# Patient Record
Sex: Male | Born: 1935 | Race: White | Hispanic: No | Marital: Married | Smoking: Former smoker
Health system: Southern US, Community
[De-identification: ages and names within clinical notes are randomized; demographics above are authoritative.]

## PROBLEM LIST (undated history)

## (undated) DIAGNOSIS — E1159 Type 2 diabetes mellitus with other circulatory complications: Secondary | ICD-10-CM

## (undated) DIAGNOSIS — R1313 Dysphagia, pharyngeal phase: Secondary | ICD-10-CM

## (undated) DIAGNOSIS — I63112 Cerebral infarction due to embolism of left vertebral artery: Secondary | ICD-10-CM

## (undated) DIAGNOSIS — I4891 Unspecified atrial fibrillation: Secondary | ICD-10-CM

## (undated) DIAGNOSIS — I499 Cardiac arrhythmia, unspecified: Secondary | ICD-10-CM

## (undated) DIAGNOSIS — I10 Essential (primary) hypertension: Secondary | ICD-10-CM

## (undated) DIAGNOSIS — I152 Hypertension secondary to endocrine disorders: Secondary | ICD-10-CM

## (undated) DIAGNOSIS — E119 Type 2 diabetes mellitus without complications: Secondary | ICD-10-CM

## (undated) HISTORY — PX: TONSILLECTOMY: SHX5217

---

## 1993-07-31 HISTORY — PX: PROSTATE SURGERY: SHX751

## 2005-03-08 ENCOUNTER — Ambulatory Visit: Payer: Self-pay | Admitting: Internal Medicine

## 2005-03-14 ENCOUNTER — Ambulatory Visit: Payer: Self-pay | Admitting: Internal Medicine

## 2010-12-14 ENCOUNTER — Ambulatory Visit: Payer: Self-pay | Admitting: Ophthalmology

## 2012-07-20 ENCOUNTER — Inpatient Hospital Stay: Payer: Self-pay | Admitting: Student

## 2012-07-20 LAB — PROTIME-INR
INR: 3.3
Prothrombin Time: 33.2 secs — ABNORMAL HIGH (ref 11.5–14.7)

## 2012-07-20 LAB — COMPREHENSIVE METABOLIC PANEL
Albumin: 3.6 g/dL (ref 3.4–5.0)
Alkaline Phosphatase: 102 U/L (ref 50–136)
Anion Gap: 4 — ABNORMAL LOW (ref 7–16)
BUN: 33 mg/dL — ABNORMAL HIGH (ref 7–18)
Calcium, Total: 8.6 mg/dL (ref 8.5–10.1)
Glucose: 272 mg/dL — ABNORMAL HIGH (ref 65–99)
Potassium: 4.5 mmol/L (ref 3.5–5.1)
SGOT(AST): 29 U/L (ref 15–37)
Sodium: 141 mmol/L (ref 136–145)

## 2012-07-20 LAB — CBC
HGB: 14.5 g/dL (ref 13.0–18.0)
MCHC: 32.6 g/dL (ref 32.0–36.0)

## 2012-07-21 LAB — CBC WITH DIFFERENTIAL/PLATELET
Basophil #: 0.1 10*3/uL (ref 0.0–0.1)
Lymphocyte #: 2.7 10*3/uL (ref 1.0–3.6)
MCH: 30.6 pg (ref 26.0–34.0)
MCHC: 32.6 g/dL (ref 32.0–36.0)
Monocyte #: 1.3 x10 3/mm — ABNORMAL HIGH (ref 0.2–1.0)
Neutrophil #: 5.6 10*3/uL (ref 1.4–6.5)
Platelet: 137 10*3/uL — ABNORMAL LOW (ref 150–440)
RDW: 14.4 % (ref 11.5–14.5)
WBC: 10.1 10*3/uL (ref 3.8–10.6)

## 2012-07-21 LAB — BASIC METABOLIC PANEL
Calcium, Total: 8.9 mg/dL (ref 8.5–10.1)
Co2: 26 mmol/L (ref 21–32)
Osmolality: 296 (ref 275–301)
Potassium: 4 mmol/L (ref 3.5–5.1)
Sodium: 144 mmol/L (ref 136–145)

## 2012-07-21 LAB — LIPID PANEL: HDL Cholesterol: 45 mg/dL (ref 40–60)

## 2012-07-21 LAB — HEMOGLOBIN
HGB: 11.4 g/dL — ABNORMAL LOW (ref 13.0–18.0)
HGB: 11.9 g/dL — ABNORMAL LOW (ref 13.0–18.0)
HGB: 11.9 g/dL — ABNORMAL LOW (ref 13.0–18.0)

## 2012-07-21 LAB — PROTIME-INR: INR: 1.1

## 2012-07-21 LAB — HEMOGLOBIN A1C: Hemoglobin A1C: 7.7 % — ABNORMAL HIGH (ref 4.2–6.3)

## 2012-07-21 LAB — HEMATOCRIT: HCT: 36.8 % — ABNORMAL LOW (ref 40.0–52.0)

## 2012-07-22 LAB — HEMOGLOBIN: HGB: 11.8 g/dL — ABNORMAL LOW (ref 13.0–18.0)

## 2012-07-22 LAB — BASIC METABOLIC PANEL
Anion Gap: 8 (ref 7–16)
BUN: 25 mg/dL — ABNORMAL HIGH (ref 7–18)
Chloride: 109 mmol/L — ABNORMAL HIGH (ref 98–107)
EGFR (African American): 40 — ABNORMAL LOW
EGFR (Non-African Amer.): 35 — ABNORMAL LOW
Glucose: 169 mg/dL — ABNORMAL HIGH (ref 65–99)

## 2012-07-22 LAB — HEMATOCRIT: HCT: 34.8 % — ABNORMAL LOW (ref 40.0–52.0)

## 2012-07-22 LAB — PROTIME-INR
INR: 1
Prothrombin Time: 13.7 secs (ref 11.5–14.7)

## 2012-07-23 LAB — BASIC METABOLIC PANEL
Anion Gap: 9 (ref 7–16)
Calcium, Total: 8.5 mg/dL (ref 8.5–10.1)
Chloride: 109 mmol/L — ABNORMAL HIGH (ref 98–107)
Co2: 24 mmol/L (ref 21–32)
Osmolality: 291 (ref 275–301)
Potassium: 4 mmol/L (ref 3.5–5.1)

## 2012-07-23 LAB — HEMATOCRIT: HCT: 34.5 % — ABNORMAL LOW (ref 40.0–52.0)

## 2012-08-04 ENCOUNTER — Emergency Department: Payer: Self-pay | Admitting: Emergency Medicine

## 2012-08-04 LAB — COMPREHENSIVE METABOLIC PANEL
Albumin: 3.5 g/dL (ref 3.4–5.0)
Calcium, Total: 8.5 mg/dL (ref 8.5–10.1)
Co2: 26 mmol/L (ref 21–32)
Creatinine: 1.7 mg/dL — ABNORMAL HIGH (ref 0.60–1.30)
EGFR (Non-African Amer.): 36 — ABNORMAL LOW
Glucose: 117 mg/dL — ABNORMAL HIGH (ref 65–99)
SGPT (ALT): 83 U/L — ABNORMAL HIGH (ref 12–78)
Sodium: 144 mmol/L (ref 136–145)
Total Protein: 7 g/dL (ref 6.4–8.2)

## 2012-08-04 LAB — CK TOTAL AND CKMB (NOT AT ARMC): CK-MB: 3.3 ng/mL (ref 0.5–3.6)

## 2012-08-04 LAB — CBC
HGB: 11.3 g/dL — ABNORMAL LOW (ref 13.0–18.0)
MCHC: 32.5 g/dL (ref 32.0–36.0)
MCV: 94 fL (ref 80–100)

## 2012-09-30 ENCOUNTER — Emergency Department: Payer: Self-pay | Admitting: Emergency Medicine

## 2012-09-30 ENCOUNTER — Inpatient Hospital Stay (HOSPITAL_COMMUNITY): Payer: Medicare Other

## 2012-09-30 ENCOUNTER — Inpatient Hospital Stay (HOSPITAL_COMMUNITY)
Admission: AD | Admit: 2012-09-30 | Discharge: 2012-10-09 | DRG: 065 | Disposition: A | Discharge status: Rehabilitation Facility | Payer: Medicare Other | Source: Other Acute Inpatient Hospital | Attending: Neurology | Admitting: Neurology

## 2012-09-30 ENCOUNTER — Encounter (HOSPITAL_COMMUNITY): Payer: Self-pay | Admitting: General Practice

## 2012-09-30 DIAGNOSIS — I482 Chronic atrial fibrillation, unspecified: Secondary | ICD-10-CM

## 2012-09-30 DIAGNOSIS — R0602 Shortness of breath: Secondary | ICD-10-CM | POA: Diagnosis present

## 2012-09-30 DIAGNOSIS — B3781 Candidal esophagitis: Secondary | ICD-10-CM

## 2012-09-30 DIAGNOSIS — Z9989 Dependence on other enabling machines and devices: Secondary | ICD-10-CM | POA: Diagnosis present

## 2012-09-30 DIAGNOSIS — R4701 Aphasia: Secondary | ICD-10-CM | POA: Diagnosis present

## 2012-09-30 DIAGNOSIS — T18128A Food in esophagus causing other injury, initial encounter: Secondary | ICD-10-CM

## 2012-09-30 DIAGNOSIS — R1313 Dysphagia, pharyngeal phase: Secondary | ICD-10-CM

## 2012-09-30 DIAGNOSIS — I6789 Other cerebrovascular disease: Secondary | ICD-10-CM

## 2012-09-30 DIAGNOSIS — J4489 Other specified chronic obstructive pulmonary disease: Secondary | ICD-10-CM | POA: Diagnosis present

## 2012-09-30 DIAGNOSIS — R1310 Dysphagia, unspecified: Secondary | ICD-10-CM

## 2012-09-30 DIAGNOSIS — R1319 Other dysphagia: Secondary | ICD-10-CM | POA: Diagnosis present

## 2012-09-30 DIAGNOSIS — G4733 Obstructive sleep apnea (adult) (pediatric): Secondary | ICD-10-CM | POA: Diagnosis present

## 2012-09-30 DIAGNOSIS — E119 Type 2 diabetes mellitus without complications: Secondary | ICD-10-CM

## 2012-09-30 DIAGNOSIS — I6350 Cerebral infarction due to unspecified occlusion or stenosis of unspecified cerebral artery: Secondary | ICD-10-CM

## 2012-09-30 DIAGNOSIS — Z87891 Personal history of nicotine dependence: Secondary | ICD-10-CM

## 2012-09-30 DIAGNOSIS — R634 Abnormal weight loss: Secondary | ICD-10-CM | POA: Diagnosis present

## 2012-09-30 DIAGNOSIS — E039 Hypothyroidism, unspecified: Secondary | ICD-10-CM | POA: Diagnosis present

## 2012-09-30 DIAGNOSIS — I4891 Unspecified atrial fibrillation: Secondary | ICD-10-CM

## 2012-09-30 DIAGNOSIS — E785 Hyperlipidemia, unspecified: Secondary | ICD-10-CM | POA: Diagnosis present

## 2012-09-30 DIAGNOSIS — G8190 Hemiplegia, unspecified affecting unspecified side: Secondary | ICD-10-CM | POA: Diagnosis present

## 2012-09-30 DIAGNOSIS — I6340 Cerebral infarction due to embolism of unspecified cerebral artery: Principal | ICD-10-CM | POA: Diagnosis present

## 2012-09-30 DIAGNOSIS — Z9282 Status post administration of tPA (rtPA) in a different facility within the last 24 hours prior to admission to current facility: Secondary | ICD-10-CM

## 2012-09-30 DIAGNOSIS — I10 Essential (primary) hypertension: Secondary | ICD-10-CM | POA: Diagnosis present

## 2012-09-30 DIAGNOSIS — J441 Chronic obstructive pulmonary disease with (acute) exacerbation: Secondary | ICD-10-CM

## 2012-09-30 DIAGNOSIS — Z6827 Body mass index (BMI) 27.0-27.9, adult: Secondary | ICD-10-CM

## 2012-09-30 DIAGNOSIS — J449 Chronic obstructive pulmonary disease, unspecified: Secondary | ICD-10-CM | POA: Diagnosis present

## 2012-09-30 DIAGNOSIS — I63112 Cerebral infarction due to embolism of left vertebral artery: Secondary | ICD-10-CM

## 2012-09-30 DIAGNOSIS — R066 Hiccough: Secondary | ICD-10-CM | POA: Diagnosis not present

## 2012-09-30 DIAGNOSIS — R29810 Facial weakness: Secondary | ICD-10-CM | POA: Diagnosis present

## 2012-09-30 HISTORY — DX: Dysphagia, pharyngeal phase: R13.13

## 2012-09-30 HISTORY — DX: Hypertension secondary to endocrine disorders: I15.2

## 2012-09-30 HISTORY — DX: Unspecified atrial fibrillation: I48.91

## 2012-09-30 HISTORY — DX: Cerebral infarction due to embolism of left vertebral artery: I63.112

## 2012-09-30 HISTORY — DX: Type 2 diabetes mellitus with other circulatory complications: E11.59

## 2012-09-30 HISTORY — DX: Essential (primary) hypertension: I10

## 2012-09-30 HISTORY — DX: Type 2 diabetes mellitus without complications: E11.9

## 2012-09-30 LAB — COMPREHENSIVE METABOLIC PANEL
Albumin: 3.5 g/dL (ref 3.4–5.0)
Alkaline Phosphatase: 99 U/L (ref 50–136)
Calcium, Total: 8.5 mg/dL (ref 8.5–10.1)
Chloride: 108 mmol/L — ABNORMAL HIGH (ref 98–107)
Co2: 28 mmol/L (ref 21–32)
Creatinine: 1.65 mg/dL — ABNORMAL HIGH (ref 0.60–1.30)
Glucose: 149 mg/dL — ABNORMAL HIGH (ref 65–99)
Osmolality: 291 (ref 275–301)
Potassium: 3.4 mmol/L — ABNORMAL LOW (ref 3.5–5.1)
SGOT(AST): 36 U/L (ref 15–37)
Total Protein: 7.5 g/dL (ref 6.4–8.2)

## 2012-09-30 LAB — CBC
HCT: 38.6 % — ABNORMAL LOW (ref 40.0–52.0)
HGB: 12.1 g/dL — ABNORMAL LOW (ref 13.0–18.0)
MCV: 86 fL (ref 80–100)
RDW: 14.4 % (ref 11.5–14.5)
WBC: 9.2 10*3/uL (ref 3.8–10.6)

## 2012-09-30 LAB — TROPONIN I: Troponin-I: <0.02 ng/mL

## 2012-09-30 LAB — PROTIME-INR: Prothrombin Time: 13.7 secs (ref 11.5–14.7)

## 2012-09-30 LAB — CK TOTAL AND CKMB (NOT AT ARMC)
CK, Total: 116 U/L (ref 35–232)
CK-MB: 2.2 ng/mL (ref 0.5–3.6)

## 2012-09-30 MED ORDER — ONDANSETRON HCL 4 MG/2ML IJ SOLN
4.0000 mg | Freq: Four times a day (QID) | INTRAMUSCULAR | Status: DC | PRN
Start: 2012-09-30 — End: 2012-10-09
  Administered 2012-09-30 – 2012-10-09 (×8): 4 mg via INTRAVENOUS
  Filled 2012-09-30 (×8): qty 2

## 2012-09-30 MED ORDER — INSULIN ASPART 100 UNIT/ML ~~LOC~~ SOLN
0.0000 [IU] | Freq: Three times a day (TID) | SUBCUTANEOUS | Status: DC
  Administered 2012-10-01 – 2012-10-02 (×4): 3 [IU] via SUBCUTANEOUS
  Administered 2012-10-02 – 2012-10-03 (×3): 2 [IU] via SUBCUTANEOUS
  Administered 2012-10-03: 3 [IU] via SUBCUTANEOUS
  Administered 2012-10-03 – 2012-10-04 (×2): 2 [IU] via SUBCUTANEOUS
  Administered 2012-10-04: 3 [IU] via SUBCUTANEOUS
  Administered 2012-10-04 – 2012-10-08 (×4): 2 [IU] via SUBCUTANEOUS
  Administered 2012-10-08: 3 [IU] via SUBCUTANEOUS
  Administered 2012-10-08: 2 [IU] via SUBCUTANEOUS
  Administered 2012-10-09: 3 [IU] via SUBCUTANEOUS

## 2012-09-30 MED ORDER — SENNOSIDES-DOCUSATE SODIUM 8.6-50 MG PO TABS
1.0000 | ORAL_TABLET | Freq: Every evening | ORAL | Status: DC | PRN
  Filled 2012-09-30 (×2): qty 1

## 2012-09-30 MED ORDER — SODIUM CHLORIDE 0.9 % IV SOLN
INTRAVENOUS | Status: DC
  Administered 2012-09-30 – 2012-10-01 (×2): via INTRAVENOUS

## 2012-09-30 NOTE — Consult Note (Signed)
Referring Physician: Transferred from [facility] after receiving IV tpa.    Chief Complaint: right sided weakness.  HPI:                                                                                                                                         Joe Snow is an 79 y.o. male with a past medical history significant for hypertension, diabetes, atrial fibrillation off coumadin due to diverticulitis with GI bleeding December 2013,  who was in his usual state of health until 12:45 pm today when he started vomiting and was noted to have right arm weakness and right face droopiness.  He was taken to [facility] ED where he reported to have a NIHSS 5 and a CT brain that showed no acute intracranial abnormality. There was a concerned about patient's bleeding last December as well as very elevated systolic blood pressure which apparently resulted in a delay in administering thrombolytics. I was informed by the ED physician that patient's daughter was demanding treatment with IV thrombolytics even when he was close to the 4 hours after onset of symptoms. He was transferred to [facility] for further management. Upon arrival to the NICU his NIHSS was still 5, and he was complaining of a mild headache.  LSN: 12:45 pm. tPA Given: yes, IV tpa.   No past medical history on file.  No past surgical history on file.  No family history on file. Social History:  has no tobacco, alcohol, and drug history on file.  Allergies: Allergies not on file  Medications:                                                                                                                           I have reviewed the patient's current medications.  ROS:  History obtained from the patient and chart review.  General ROS: negative for - chills, fatigue,  fever, night sweats, weight gain or weight loss Psychological ROS: negative for - behavioral disorder, hallucinations, memory difficulties, mood swings or suicidal ideation Ophthalmic ROS: negative for - blurry vision, double vision, eye pain or loss of vision ENT ROS: negative for - epistaxis, nasal discharge, oral lesions, sore throat, tinnitus or vertigo Allergy and Immunology ROS: negative for - hives or itchy/watery eyes Hematological and Lymphatic ROS: negative for - bleeding problems, bruising or swollen lymph nodes Endocrine ROS: negative for - galactorrhea, hair pattern changes, polydipsia/polyuria or temperature intolerance Respiratory ROS: negative for - cough, hemoptysis, shortness of breath or wheezing Cardiovascular ROS: negative for - chest pain, dyspnea on exertion, edema or irregular heartbeat Gastrointestinal ROS: negative for - abdominal pain, diarrhea,or stool incontinence Genito-Urinary ROS: negative for - dysuria, hematuria, incontinence or urinary frequency/urgency Musculoskeletal ROS: negative for - joint swelling or muscular weakness Neurological ROS: as noted in HPI Dermatological ROS: negative for rash and skin lesion changes      Physical exam: pleasant male in no apparent distress. Blood pressure 135/64, pulse 69, temperature 97.5 F (36.4 C), temperature source Oral, resp. rate 16, SpO2 99.00%. Head: normocephalic. Neck: supple, no bruits, no JVD. Cardiac: no murmurs. Lungs: clear. Abdomen: soft, no tender, no mass. Extremities: no edema.  Neurologic Examination:                                                                                                      Mental Status: Alert, awake, oriented x 4, thought content appropriate.  Speech fluent without evidence of aphasia.  Able to follow 3 step commands without difficulty. Cranial Nerves: II: Discs flat bilaterally; Visual fields grossly normal, pupils equal, round, reactive to light and  accommodation III,IV, VI: ptosis not present, extra-ocular motions intact bilaterally V: facial light touch sensation normal bilaterally. VII: subtle right lower face droopiness. VIII: hearing normal bilaterally IX,X: gag reflex present XI: bilateral shoulder shrug XII: midline tongue extension Motor: Right : Upper extremity   5/5    Left:     Upper extremity   5/5  Lower extremity   5/5     Lower extremity   5/5 Tone and bulk:normal tone throughout; no atrophy noted Sensory: Pinprick and light touch intact throughout, bilaterally. Deep Tendon Reflexes: 1+ and symmetric throughout Plantars: Right: equivocal.   Left: upgoing Cerebellar: Can not [erform finger-to-nose and heel-to-shin test in the right due to weakness. Normal in the left side. Gait: no tested. CV: pulses palpable throughout    No results found for this or any previous visit (from the past 48 hour(s)). No results found.     Assessment: 78 y.o. male with risk factors for stroke that includes hypertension, diabetes, and atrial fibrillation, developed acute onset right sided weakness and right face droopiness at 12:45 pm today. Received IV tpa at [facility] and transferred to [facility]. NIHSS 5 at this moment. Admit to our service and follow post iv tpa protocol. Stroke service to resume care in the morning.  [name], MD  Plan: 1. HgbA1c, fasting lipid panel 2. MRI, MRA  of the brain without contrast 3. PT consult, OT consult, Speech consult 4. Echocardiogram 5. Carotid dopplers 6. Prophylactic therapy-None at this moment. 7. Risk factor modification 8. Telemetry monitoring 9. Frequent neuro checks  [facility] [phone]  09/30/2012, 6:10 PM

## 2012-10-01 ENCOUNTER — Encounter (HOSPITAL_COMMUNITY): Payer: Self-pay | Admitting: Nurse Practitioner

## 2012-10-01 ENCOUNTER — Inpatient Hospital Stay (HOSPITAL_COMMUNITY): Payer: Medicare Other

## 2012-10-01 DIAGNOSIS — I63219 Cerebral infarction due to unspecified occlusion or stenosis of unspecified vertebral arteries: Secondary | ICD-10-CM

## 2012-10-01 DIAGNOSIS — I059 Rheumatic mitral valve disease, unspecified: Secondary | ICD-10-CM

## 2012-10-01 DIAGNOSIS — I4891 Unspecified atrial fibrillation: Secondary | ICD-10-CM

## 2012-10-01 DIAGNOSIS — I6350 Cerebral infarction due to unspecified occlusion or stenosis of unspecified cerebral artery: Secondary | ICD-10-CM

## 2012-10-01 LAB — GLUCOSE, CAPILLARY
Glucose-Capillary: 157 mg/dL — ABNORMAL HIGH (ref 70–99)
Glucose-Capillary: 151 mg/dL — ABNORMAL HIGH (ref 70–99)

## 2012-10-01 LAB — HEMOGLOBIN A1C: Hgb A1c MFr Bld: 8 % — ABNORMAL HIGH (ref <5.7)

## 2012-10-01 LAB — LIPID PANEL
HDL: 51 mg/dL (ref >39)
LDL Cholesterol: 65 mg/dL (ref 0–99)
Triglycerides: 46 mg/dL (ref <150)

## 2012-10-01 MED ORDER — SIMVASTATIN 5 MG PO TABS
5.0000 mg | ORAL_TABLET | Freq: Every day | ORAL | Status: DC
  Administered 2012-10-01 – 2012-10-08 (×7): 5 mg via ORAL
  Filled 2012-10-01 (×9): qty 1

## 2012-10-01 MED ORDER — GLUCERNA SHAKE PO LIQD
237.0000 mL | Freq: Two times a day (BID) | ORAL | Status: DC
  Administered 2012-10-01 – 2012-10-03 (×4): 237 mL via ORAL

## 2012-10-01 MED ORDER — LISINOPRIL 5 MG PO TABS
5.0000 mg | ORAL_TABLET | Freq: Every day | ORAL | Status: DC
  Administered 2012-10-01 – 2012-10-09 (×8): 5 mg via ORAL
  Filled 2012-10-01 (×9): qty 1

## 2012-10-01 NOTE — Evaluation (Signed)
Speech Language Pathology Evaluation Patient Details Name: Joe Snow MRN: [id number] DOB: 23-Jan-1924 Today's Date: 10/01/2012 Time: 8295-6213 SLP Time Calculation (min): 26 min  Problem List: There is no problem list on file for this patient.  Past Medical History:  Past Medical History  Diagnosis Date  . Hypertension associated with diabetes   . Diabetes   . Atrial fibrillation    Past Surgical History:  Past Surgical History  Procedure Laterality Date  . Prostate surgery N/A 1995    estimate  . Tonsillectomy Bilateral 1930s   HPI:  [name] is a 79 y.o. male presenting with right arm and face weakness. Status post IV t-PA 09/30/2012 at 1630 at [facility], transferred to [facility]. tPA received 3h and 45 mins post symptom onset. Left brain stroke suspected. Imaging pending (MRI scheduled for this after noon at 1630, 24h post tPA). Off warfarin since Dec 2013 for GIB. Now on no anticoagulants as within 24h of tpa administration for secondary stroke prevention. Patient with resultant right arm hemiparesis, facial droop, dysarthria.    Assessment / Plan / Recommendation Clinical Impression  Pt demosntrates a mild expressive aphasia at conversation level when discussing unfamiliar/complex topics. Pt with slow verbal processing, circumlocutions and mild anomia. There was also some evidence for memory deficit. Pt would benefit from ongoing acute SLP therapy to facilitate verbal expression and higher level cognition as needed. Would agree with CIR consult.     SLP Assessment  Patient needs continued Speech Lanaguage Pathology Services    Follow Up Recommendations  Inpatient Rehab    Frequency and Duration min 1 x/week  2 weeks   Pertinent Vitals/Pain NA   SLP Goals  SLP Goals Potential to Achieve Goals: Good Progress/Goals/Alternative treatment plan discussed with pt/caregiver and they: Agree SLP Goal #1: Pt will verbalize at conversation level withmin verbal  cues for word finding for 10 minutes SLP Goal #2: Pt will utilize compensatory strategies for word finding with min verbal cues x3 over 20 minute conversation.   SLP Evaluation Prior Functioning  Cognitive/Linguistic Baseline: Within functional limits Type of Home: House Lives With: Spouse Available Help at Discharge: Family;Available 24 hours/day Vocation: Retired   IT consultant  Overall Cognitive Status: Appears within functional limits for tasks assessed Arousal/Alertness: Awake/alert Orientation Level: Oriented X4 Attention: Focused;Sustained;Selective;Alternating Focused Attention: Appears intact Sustained Attention: Appears intact Selective Attention: Appears intact Alternating Attention: Impaired Alternating Attention Impairment: Verbal complex;Functional complex Memory: Impaired Memory Impairment: Storage deficit;Decreased recall of new information Awareness: Impaired Awareness Impairment: Anticipatory impairment Executive Function: Reasoning Reasoning: Impaired Reasoning Impairment: Verbal complex    Comprehension  Auditory Comprehension Overall Auditory Comprehension: Appears within functional limits for tasks assessed    Expression Verbal Expression Overall Verbal Expression: Impaired Initiation: No impairment Automatic Speech: Name;Social Response;Counting;Month of year Level of Generative/Spontaneous Verbalization: Conversation Repetition: No impairment Naming: Impairment Responsive: 76-100% accurate Confrontation: Within functional limits Convergent: Not tested Divergent: 75-100% accurate Written Expression Dominant Hand: Right   Oral / Motor Oral Motor/Sensory Function Overall Oral Motor/Sensory Function: Appears within functional limits for tasks assessed Motor Speech Overall Motor Speech: Appears within functional limits for tasks assessed   GO    Harlon Ditty, MA CCC-SLP [phone]  Claudine Mouton 10/01/2012, 4:52 PM

## 2012-10-01 NOTE — Progress Notes (Signed)
VASCULAR LAB PRELIMINARY  PRELIMINARY  PRELIMINARY  PRELIMINARY  Carotid duplex  completed.    Preliminary report:  Bilateral:  No evidence of hemodynamically significant internal carotid artery stenosis.   Vertebral artery flow is antegrade.  Right vertebral artery waveform is spiked with loss of diastolic component.    CESTONE, HELENE, RVT 10/01/2012, 1:21 PM

## 2012-10-01 NOTE — Progress Notes (Addendum)
Stroke Team Progress Note  HISTORY Joe Snow is an 79 y.o. male with a past medical history significant for hypertension, diabetes, atrial fibrillation off coumadin due to diverticulitis with GI bleeding December 2013, who was in his usual state of health until 12:45 pm today 09/30/2012 when he started vomiting and was noted to have right arm weakness and right face droopiness.  He was taken to [facility] ED where he reported to have a NIHSS 5 and a CT brain that showed no acute intracranial abnormality.  There was a concerned about patient's bleeding last December as well as very elevated systolic blood pressure which apparently resulted in a delay in administering thrombolytics. I was informed by the ED physician that patient's daughter was demanding treatment with IV thrombolytics even when he was close to the 4 hours after onset of symptoms. He was transferred to [facility] for further management.  Upon arrival to the NICU his NIHSS was still 5, and he was complaining of a mild headache.  SUBJECTIVE His wife is at the bedside.  Overall he feels his condition is gradually improving. They do report SOB since GIB in Dec when medications were changed. He has episodes when it is worse than baseline. Etiology is unclear.  OBJECTIVE Most recent Vital Signs: Filed Vitals:   10/01/12 0400 10/01/12 0500 10/01/12 0600 10/01/12 0800  BP: 144/70 146/76 159/85 166/77  Pulse: 72 66 66 63  Temp:      TempSrc:      Resp: 13 17 11 18   Height:      Weight:      SpO2: 99% 99% 100% 98%   CBG (last 3)   Recent Labs  09/30/12 2349 10/01/12 0357  GLUCAP 198* 157*    IV Fluid Intake:   . sodium chloride 75 mL/hr at 10/01/12 0800    MEDICATIONS  . insulin aspart  0-15 Units Subcutaneous TID WC   PRN:  ondansetron, senna-docusate  Diet:  Carb Control thin liquids Activity:  Bedrest DVT Prophylaxis:  SCDs   CLINICALLY SIGNIFICANT STUDIES Basic Metabolic Panel: No results found  for this basename: NA, K, CL, CO2, GLUCOSE, BUN, CREATININE, CALCIUM, MG, PHOS,  in the last 168 hours Liver Function Tests: No results found for this basename: AST, ALT, ALKPHOS, BILITOT, PROT, ALBUMIN,  in the last 168 hours CBC: No results found for this basename: WBC, NEUTROABS, HGB, HCT, MCV, PLT,  in the last 168 hours Coagulation: No results found for this basename: LABPROT, INR,  in the last 168 hours Cardiac Enzymes: No results found for this basename: CKTOTAL, CKMB, CKMBINDEX, TROPONINI,  in the last 168 hours Urinalysis: No results found for this basename: COLORURINE, APPERANCEUR, LABSPEC, PHURINE, GLUCOSEU, HGBUR, BILIRUBINUR, KETONESUR, PROTEINUR, UROBILINOGEN, NITRITE, LEUKOCYTESUR,  in the last 168 hours Lipid Panel    Component Value Date/Time   CHOL 125 10/01/2012 0325   TRIG 46 10/01/2012 0325   HDL 51 10/01/2012 0325   CHOLHDL 2.5 10/01/2012 0325   VLDL 9 10/01/2012 0325   LDLCALC 65 10/01/2012 0325   HgbA1C  No results found for this basename: HGBA1C    Urine Drug Screen:   No results found for this basename: labopia, cocainscrnur, labbenz, amphetmu, thcu, labbarb    Alcohol Level: No results found for this basename: ETH,  in the last 168 hours  CT of the brain    MRI of the brain    MRA of the brain    2D Echocardiogram    Carotid Doppler  CXR  09/30/2012   Cardiomegaly with underlying chronic interstitial coarsening.   EKG     Therapy Recommendations   Physical Exam   Pleasant middle aged caucasian male not in distress.Awake alert. Afebrile. Head is nontraumatic. Neck is supple without bruit. Hearing is normal. Cardiac exam no murmur or gallop. Lungs are clear to auscultation. Distal pulses are well felt. Neurological Exam ; a wake and alert oriented x3 with normal speech and language. Extraocular moments are full range without nystagmus. Vision acuity and fields appear normal. Fundi were not visualized. There is no facial weakness. There is no dysarthria. Tongue  is midline. Motor system exam reveals no upper or lower extremity drift. Fine finger movements are diminished on the right. There is mild finger-to-nose dysmetria on the right. Right grip is weaker than the left. He orbits left over right approximately. No lower extremity weakness. Sensation appears normal. Gait was not tested.  NIHSS one ASSESSMENT Joe Snow is a 79 y.o. male presenting with right arm and face weakness. Status post IV t-PA 09/30/2012 at 1630 at [facility], transferred to [facility]. tPA received 3h and 45 mins post symptom onset. Left brain stroke suspected. Imaging pending (MRI scheduled for this after noon at 1630, 24h post tPA). Off warfarin since Dec 2013 for GIB. Now on no anticoagulants as within 24h of tpa administration for secondary stroke prevention. Patient with resultant right arm hemiparesis, facial droop, dysarthria. Work up underway.  Hypertension Diabetes, HgbA1c pending atrial fibrillation, taken off coumadin Dec 2013 due to GIB LDL 65 Hx diverticulitis/GIB 06/2012 taken off coumadin at that time SOB episodes, ? etiology  Hospital day # 1  TREATMENT/PLAN  MRI/MRA at 1630 today  If imaging negative for hemorrhage, Add anticoagulant (prefer newer anticoagulants) for secondary stroke prevention if cardiology agrees, otherwise, will consider aspirin and plavix.  OOB. Therapy evals  Complete 24h post tPA ICU monitoring. If stable after 24h, ok for transfer to the floor  Cardiology consult to assess cardiac reasons for SOB and atrial fibrillation  SHARON BIBY, MSN, RN, ANVP-BC, ANP-BC, GNP-BC [facility] Pager: [phone] 10/01/2012 8:16 AM  I have personally obtained a history, examined the patient, evaluated imaging results, and formulated the assessment and plan of care. I agree with the above. This patient is critically ill and at significant risk of neurological worsening, death and care requires constant monitoring of vital  signs, hemodynamics,respiratory and cardiac monitoring,review of multiple databases, neurological assessment, discussion with family, other specialists and medical decision making of high complexity. I spent 30 minutes of neurocritical care time  in the care of  this patient.  Delia Heady, MD Medical Director [facility] Pager: [phone] 10/01/2012 3:53 PM

## 2012-10-01 NOTE — Progress Notes (Signed)
Physical medicine and rehabilitation consult has been requested. Full neuro workup presently ongoing and patient not in the room. Will followup as evaluations completed

## 2012-10-01 NOTE — Progress Notes (Signed)
Rehab Admissions Coordinator Note:  Patient was screened by [name] Dare for appropriateness for an Inpatient Acute Rehab Consult.  At this time, we are recommending Inpatient Rehab consult.  [name] Dare 10/01/2012, 10:52 AM  I can be reached at [phone].

## 2012-10-01 NOTE — Evaluation (Signed)
Physical Therapy Evaluation Patient Details Name: Joe Snow MRN: [id number] DOB: October 09, 1923 Today's Date: 10/01/2012 Time: 0865-7846 PT Time Calculation (min): 52 min  PT Assessment / Plan / Recommendation Clinical Impression  79 y.o. male admitted to [facility] for right sided weakness, fall suspected stroke.  The pt did recieve IV tPA.  MRI/A pending.  He presents with more right sided coordination issues and some mild strength deficits on his right.  Decreased sitting and standing balance, decreased safety awareness and impulsivity.  He would benefit from inpatient rehabilitation at discharge for intensive therapy prior to returning home.      PT Assessment  Patient needs continued PT services    Follow Up Recommendations  CIR    Does the patient have the potential to tolerate intense rehabilitation    yes  Barriers to Discharge None none    Equipment Recommendations  Rolling walker with 5" wheels    Recommendations for Other Services Rehab consult   Frequency Min 4X/week    Precautions / Restrictions Precautions Precautions: Fall Precaution Comments: decreased safety awareness   Pertinent Vitals/Pain No reports of pain, see vials flow sheet for vitals      Mobility  Bed Mobility Bed Mobility: Supine to Sit;Sitting - Scoot to Edge of Bed Supine to Sit: 5: Supervision;With rails Sitting - Scoot to Edge of Bed: With rail;4: Min guard Details for Bed Mobility Assistance: supervision for safety due to pt tends to LOB to the right when moving.   Transfers Transfers: Sit to Stand;Stand to Sit;Stand Pivot Transfers Sit to Stand: 1: +2 Total assist;With upper extremity assist;From bed;From chair/3-in-1 Sit to Stand: Patient Percentage: 70% Stand to Sit: 1: +2 Total assist;With armrests;To bed;To chair/3-in-1;With upper extremity assist Stand to Sit: Patient Percentage: 70% Stand Pivot Transfers: 1: +2 Total assist Stand Pivot Transfers: Patient Percentage: 70% Details for  Transfer Assistance: Verbal cues for sequencing left weight shift and balance.   Modified Rankin (Stroke Patients Only) Pre-Morbid Rankin Score: No symptoms Modified Rankin: Severe disability        PT Diagnosis: Difficulty walking;Abnormality of gait;Generalized weakness;Altered mental status  PT Problem List: Decreased strength;Decreased activity tolerance;Decreased balance;Decreased mobility;Decreased coordination;Decreased cognition;Decreased knowledge of use of DME;Decreased safety awareness PT Treatment Interventions: DME instruction;Gait training;Stair training;Functional mobility training;Therapeutic activities;Therapeutic exercise;Balance training;Neuromuscular re-education;Cognitive remediation;Patient/family education   PT Goals Acute Rehab PT Goals PT Goal Formulation: With patient/family Time For Goal Achievement: 10/15/12 Potential to Achieve Goals: Good Pt will go Supine/Side to Sit: with modified independence;with HOB 0 degrees PT Goal: Supine/Side to Sit - Progress: Goal set today Pt will go Sit to Supine/Side: with modified independence;with HOB 0 degrees PT Goal: Sit to Supine/Side - Progress: Goal set today Pt will go Sit to Stand: with supervision PT Goal: Sit to Stand - Progress: Goal set today Pt will go Stand to Sit: with supervision;with upper extremity assist PT Goal: Stand to Sit - Progress: Goal set today Pt will Transfer Bed to Chair/Chair to Bed: with min assist PT Transfer Goal: Bed to Chair/Chair to Bed - Progress: Goal set today Pt will Ambulate: 16 - 50 feet;with min assist;with least restrictive assistive device PT Goal: Ambulate - Progress: Goal set today Pt will Go Up / Down Stairs: 3-5 stairs;with least restrictive assistive device;with min assist PT Goal: Up/Down Stairs - Progress: Goal set today  Visit Information  Last PT Received On: 10/01/12 Assistance Needed: +2 PT/OT Co-Evaluation/Treatment: Yes    Subjective Data  Subjective: Pt  reports, " You tell them what happended"  to his wife re: situation surrounding his admission. He was unable to report the details.   Patient Stated Goal: Wife is interested in reahb in [place] to be closer to home   Prior Functioning  Home Living Lives With: Spouse Available Help at Discharge: Family;Available 24 hours/day Type of Home: House Home Access: Stairs to enter [employer] of Steps: 4 Entrance Stairs-Rails: None Home Layout: Two level Alternate Level Stairs-Number of Steps: 20 (pt sleeps downstairs) Bathroom Shower/Tub: Tub/shower unit;Curtain;Walk-in shower Bathroom Toilet: Handicapped height Bathroom Accessibility: Yes How Accessible: Accessible via walker;Accessible via wheelchair Home Adaptive Equipment: Hand-held shower hose;Shower chair with back;Grab bars in shower Prior Function Level of Independence: Independent Able to Take Stairs?: Yes Driving: Yes 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[employer] Dominant Hand: Right    Cognition  Cognition Overall Cognitive Status: Impaired Area of Impairment: Memory;Safety/judgement Arousal/Alertness: Awake/alert Orientation Level: Oriented X4 / Intact Behavior During Session: Other (comment) (impulsive) Memory Deficits: relies heavily on wife for report of history (why he came in and home layout questions) Safety/Judgement: Decreased safety judgement for tasks assessed;Impulsive;Decreased awareness of need for assistance    Extremity/Trunk Assessment Right Lower Extremity Assessment RLE ROM/Strength/Tone: Deficits RLE ROM/Strength/Tone Deficits: 4/5 RLE Sensation: WFL - Light Touch RLE Coordination: Deficits RLE Coordination Deficits: decreased coordination right leg for heel to shin test right compared to left.  Decreased functional coordination while stepping around to chair (ataxic type movements, decreased ability to place foot where he wants it.   Left Lower Extremity Assessment LLE  ROM/Strength/Tone: Within functional levels LLE Sensation: WFL - Light Touch LLE Coordination: WFL - gross/fine motor   Balance Static Sitting Balance Static Sitting - Balance Support: Bilateral upper extremity supported;Feet supported Static Sitting - Level of Assistance: 4: Min assist Static Sitting - Comment/# of Minutes: min assist secondary to right lean, sat EOB x 8 mins while recovering from transfers secondary to increased DOE with exertion 3/4  Dynamic Sitting Balance Dynamic Sitting - Balance Support: No upper extremity supported;Feet supported Dynamic Sitting - Level of Assistance: 3: Mod assist Dynamic Sitting Balance - Compensations: mod assist for LOB to the right while trying to donn socks long sitting in the bed.   Static Standing Balance Static Standing - Balance Support: Bilateral upper extremity supported Static Standing - Level of Assistance: 1: +2 Total assist;Patient percentage (comment) (70%) Static Standing - Comment/# of Minutes: 2 mins while using urinal assist t needed to maintain balance, pt using recliner against lower legs for balance and support.  Not only does pt lean right, but anteriorly as well.  Verbal cuse for safe hand placement during transitions.   Dynamic Standing Balance Dynamic Standing - Balance Support: Bilateral upper extremity supported Dynamic Standing - Level of Assistance: 1: +2 Total assist Dynamic Standing - Balance Activities:  (while transferring to the chair) Dynamic Standing - Comments: assist needed for trunk stability for balance, weight shift to the left and decreased anterior weight shift. Verbal cues for foot placement sequencing and safety with movement.    End of Session PT - End of Session Equipment Utilized During Treatment: Gait belt Activity Tolerance: Patient tolerated treatment well Patient left: in chair;with call bell/phone within reach;with family/visitor present (wife in room during session) Nurse Communication: Mobility  status  GP     Lurena Joiner B. Medendorp, PT, DPT [phone]   10/01/2012, 10:23 AM

## 2012-10-01 NOTE — Consult Note (Signed)
CARDIOLOGY CONSULT NOTE  Patient ID: Joe Snow, MRN: [id number], DOB/AGE: January 03, 1924 79 y.o. Admit date: 09/30/2012 Date of Consult: 10/01/2012  Primary Physician: Sherrie Mustache, MD Primary Cardiologist: None  Chief Complaint: right sided weakness and right facial droop Reason for Consultation: Atrial Fibrillation, SOB  HPI: 79 y.o. male w/ PMHx significant for A.Fib (coumadin dc'd 06/2012 2/2 GI bleed), HTN, HLD, DMII, Hypothyroidism, and diverticulitis/GI bleed 06/2012 who transferred from [facility] to [facility] on 09/30/2012 after an acute CVA.  Mr. Lucretia Field reports having "congenital" a.fib and being placed on coumadin several years ago by his PCP. He has never had a cardiologist. In 06/2012 he developed a GI bleed in the setting of diverticulitis at which time his coumadin was stopped. During the same hospitalization he reports his blood pressure medications were changed: Lisinopril stopped and Norvasc & Metoprolol added. Shortly after being discharged he noted a progressive worsening in his breathing. He intermittently becomes short of breath, but it is erratic and unpredictable. Usually worse in the morning, but also throughout the day and occasionally at night. He was initially treated for suspected viral infection, bronchitis, and allergies per his wife's report, but there was no change in his symptoms. His wife then stopped his metoprolol and noticed some improvement, but reports his breathing is still not at baseline. Prior to December he was very active, but since discharge has been markedly fatigued. He has had a decreased appetite and lost ~5lbs over the last 3 months. He denies chest pain, palpitations, syncope, abd pain, melena/hematochezia. Has had some mild ankle swelling after sitting. He reports having had a normal echo and stress test within the last year as part of routine physical, but his PCP office only has record of echo from 2010 and no stress test  (records requested). No history of cardiac cath.   On day of presentation he was standing in the kitchen with his wife when he suddenly felt dizzy and weak on the right side. He sat down and told his wife he wasn't feeling right. She thought it was his blood sugar so tried to give him food, but he could not feed himself. She checked his blood sugar and it was 116 so she called EMS. Head CT was without acute intracranial findings. CXR showed cardiomegaly without acute cardiopulmonary findings. EKG showed A.Fib 47bpm, nonspecific ST/T changes. He was reportedly markedly hypertensive upon arrival for which he received Hydralazine. Given his elevated BP and recent h/o bleeding there was concern for tpa administration. Per H&P note the patient's daughter demanded treatment with tpa, so this was administered. He was transported to [facility] and admitted to the neurology team. Cardiology is asked to evaluate for atrial fibrillation and shortness of breath. Telemetry shows A.Fib 60-70s, with occasional rates in the 40-50s. He denies residual weakness, but has right arm drift and decreased strength in the right leg. He is not in any respiratory distress and O2 sats are >95% on RA.    Past Medical History  Diagnosis Date  . Hypertension associated with diabetes   . Diabetes   . Atrial fibrillation       Surgical History:  Past Surgical History  Procedure Laterality Date  . Prostate surgery N/A 1995    estimate  . Tonsillectomy Bilateral 1930s     Home Meds: Levothyroxine daily Lovastatin 10mg  daily Amlodipine 5mg  daily Pioglitazone 30mg  daily Levemir 25units Qam and 20units Qpm Sertraline 25mg  daily Montelukast 10mg  daily PRN   Inpatient Medications:  .  feeding supplement  237 mL Oral BID BM  . insulin aspart  0-15 Units Subcutaneous TID WC   . sodium chloride 75 mL/hr at 10/01/12 1000    Allergies: No Known Allergies  History   Social History  . Marital Status: Married     Spouse Name: Jeanie Puchalski    Number of Children: 3  . Years of Education: college   Occupational History  . Not on file.   Social History Main Topics  . Smoking status: Former Smoker    Types: Cigarettes    Quit date: 07/31/1978  . Smokeless tobacco: Never Used  . Alcohol Use: 1.0 oz/week    2 drink(s) per week  . Drug Use: No  . Sexually Active: Not on file   Other Topics Concern  . Not on file   Social History Narrative  . No narrative on file     Family history: Noncontributory at this point  Review of Systems: General: (+) 5lb weight loss; negative for chills, fever, night sweats  Cardiovascular: As per HPI Dermatological: negative for rash Respiratory: As per HPI Urologic: negative for hematuria Abdominal: negative for nausea, vomiting, diarrhea, bright red blood per rectum, melena, or hematemesis Neurologic: As per HPI All other systems reviewed and are otherwise negative except as noted above.  Labs:  09/30/12 @ 1424 (from [place] records) WBC 9.2, H/H 06/30/37.6, Plt 171k, MCV 86 INR 1.0, PT 13.7 CK 116, CKMB 2.2, Troponin <0.02 Glucose 149, BUN/Crt 26/1.65, Na 142, K 3.4, Cl 108, CO2 28, Ca 8.5 LFTs WNL  Component Value Date   CHOL 125 10/01/2012   HDL 51 10/01/2012   LDLCALC 65 10/01/2012   TRIG 46 10/01/2012    Radiology/Studies:   09/30/12 - CT Head w/o Contrast (From [place] records) Impression: No acute intracranial process  09/30/2012  - PORTABLE CHEST - 1 VIEW   Findings: 1939 hours. The cardiopericardial silhouette is enlarged. Interstitial markings are diffusely coarsened with chronic features.  No overt airspace pulmonary edema or focal lung consolidation.  No substantial pleural effusion although left costophrenic angle has not been included on the film. Imaged bony structures of the thorax are intact. Telemetry leads overlie the chest.  IMPRESSION: Cardiomegaly with underlying chronic interstitial coarsening.       EKG: 10/01/12 @ 1419 - A.Fib  47bpm, nonspecific ST/T changes  Physical Exam: Blood pressure 158/74, pulse 81, temperature 97.9 F (36.6 C), temperature source Oral, resp. rate 12, height 5\' 11"  (1.803 m), weight 197 lb 8.5 oz (89.6 kg), SpO2 95.00%. General: Elderly white male, in no acute distress. Head: Normocephalic, atraumatic, sclera non-icteric, no xanthomas, nares are without discharge. Left ocular opacity.   Neck: Supple. Negative for carotid bruits or JVD Lungs: Clear bilaterally to auscultation without wheezes, rales, or rhonchi. Breathing is unlabored. Heart: Irregularly irregular with S1 S2. 2/6 systolic murmur RUSB. No rubs or gallops appreciated. Abdomen: Soft, non-tender, non-distended with normoactive bowel sounds.  No rebound/guarding. No obvious abdominal masses. Msk: Tone appears normal for age. Extremities: No clubbing or cyanosis. No edema.  Distal pedal pulses are intact and equal bilaterally. Neuro: Right arm drift. RLE with decreased strength. Alert and oriented X 3. Psych:  Responds to questions appropriately with a normal affect.   Assessment and Plan:  79 y.o. male w/ PMHx significant for A.Fib (coumadin dc'd 06/2012 2/2 GI bleed), HTN, HLD, DMII, Hypothyroidism, and diverticulitis/GI bleed 06/2012 who transferred from [facility] to [facility] on 09/30/2012 after an acute CVA.  1. Acute  CVA  - s/p tpa on 09/30/12 2. Atrial Fibrillation  - CHADS2 score 5, off coumadin since 06/2012 2/2 GI bleed 3. Dyspnea 4. Hypertension 5. Hyperlipidemia, LDL 65 on statin 6. Diabetes Mellitus, Type 2 7. Hypothyroidism 8. H/o diverticulitis w/ GI bleed 06/2012  Patient admitted with acute CVA in the setting of atrial fibrillation off anticoagulation 2/2 GI bleed. Carotid dopplers pending, but likely cardioembolic source. Head CT was without acute findings and MRI is pending. At this point he has had no further signs of GI bleed and if MRI is without evidence of bleed, would recommend chronic  anticoagulation with coumadin when ok with neurology. His A.fib is rate controlled without AV nodal blockers. With rates as low as 40-50s, may have chronotropic incompetence, but no indication at this time for PPM. Cont statin. In regards to his dyspnea, it is unclear what has changed to cause his sob over the last 2 months. Initially felt to be r/t to metoprolol, but his breathing is not back to baseline despite it being discontinued. No clear evidence of heart failure. CXR shows chronic interstitial coarsening. Avoid BB. Check 2D echo to evaluate right sided pressures, valves, and LV function. Consider pulmonology consult if no clear cardiac source. Change norvasc back to lisinopril. Follow up on Echo, carotid doppler, Brain MRI/MRA, A1c, and TSH.   Signed, HOPE, JESSICA PA-C 10/01/2012, 11:32 AM   I have taken a history, reviewed medications, allergies, PMH, SH, FH, and reviewed ROS and examined the patient.  I agree with the assessment and plan as discussed with patient, wife, and Shanda Bumps. NO Beta blockers with bradycardia and extreme fatique. Echo at bedside shows good LV and RV systolic function. He does have mild MR and LAE, TEE will add nothing to clinical decision so would not obtain. Will follow with you. Reinitiated low dose lisinopril.  Mel Tadros C. Daleen Squibb, MD, [facility] Pager:  [phone]

## 2012-10-01 NOTE — Progress Notes (Signed)
  Echocardiogram 2D Echocardiogram has been performed.  Cathie Beams 10/01/2012, 12:44 PM

## 2012-10-01 NOTE — Evaluation (Signed)
Occupational Therapy Evaluation Patient Details Name: Joe Snow MRN: [id number] DOB: 1924-03-22 Today's Date: 10/01/2012 Time: 7829-5621 OT Time Calculation (min): 51 min  OT Assessment / Plan / Recommendation Clinical Impression  79 yo male admitted at [facility] given TPA 09/30/12 with NIH of 5. Pt with Rt side weakness and facial weakness. OT to follow acutely. Recommend CIR     OT Assessment  Patient needs continued OT Services    Follow Up Recommendations  CIR    Barriers to Discharge      Equipment Recommendations  3 in 1 bedside comode    Recommendations for Other Services Rehab consult  Frequency  Min 3X/week    Precautions / Restrictions Precautions Precautions: Fall Precaution Comments: decreased safety awareness   Pertinent Vitals/Pain No pain     ADL  Eating/Feeding: Maximal assistance (bil Hand with spillage) Where Assessed - Eating/Feeding: Chair Grooming: Wash/dry face;Maximal assistance Where Assessed - Grooming: Supported sitting Lower Body Dressing: Minimal assistance (long sitting in bed with LOB x3 to the Right) Where Assessed - Lower Body Dressing: Supine, head of bed flat Toilet Transfer: +2 Total assistance Toilet Transfer: Patient Percentage: 70% Toilet Transfer Method: Sit to stand Toilet Transfer Equipment: Raised toilet seat with arms (or 3-in-1 over toilet) Equipment Used: Gait belt Transfers/Ambulation Related to ADLs: Pt completed transfer to the Lt EOB <>chair then back to bed then back to chair again to demonstrate need for assistance and left side strength.  ADL Comments: Pt don socks long sitting in bed, lack of awareness of deficits, referring to wife for (A) and poor recall of education in session. Pt high fall risk. Pt insist on walking to the bathroom and required multiple attempts to educate patient on the need to use urinal. Pt successful voided total +2  total (A) to hold urinal.     OT Diagnosis: Generalized weakness;Cognitive  deficits;Ataxia  OT Problem List: Decreased strength;Decreased activity tolerance;Impaired balance (sitting and/or standing);Decreased safety awareness;Decreased knowledge of use of DME or AE;Decreased knowledge of precautions;Impaired UE functional use;Decreased coordination;Decreased cognition OT Treatment Interventions: Self-care/ADL training;Therapeutic exercise;Neuromuscular education;DME and/or AE instruction;Therapeutic activities;Cognitive remediation/compensation;Patient/family education;Balance training   OT Goals Acute Rehab OT Goals OT Goal Formulation: With patient/family Time For Goal Achievement: 10/15/12 Potential to Achieve Goals: Good ADL Goals Pt Will Perform Grooming: with min assist;Supported;Sitting, chair ADL Goal: Grooming - Progress: Goal set today Pt Will Transfer to Toilet: with max assist;Stand pivot transfer;3-in-1 ADL Goal: Toilet Transfer - Progress: Goal set today Pt Will Perform Toileting - Hygiene: with mod assist;Sit to stand from 3-in-1/toilet ADL Goal: Toileting - Hygiene - Progress: Goal set today Miscellaneous OT Goals Miscellaneous OT Goal #1: Pt will use visual input attending to Rt UE for hand to mouth ADLS OT Goal: Miscellaneous Goal #1 - Progress: Goal set today  Visit Information  Last OT Received On: 10/01/12 Assistance Needed: +2 PT/OT Co-Evaluation/Treatment: Yes    Subjective Data  Subjective: "My right side"- pt after multiple transfers and education that LT side is the strong side to transfer to. 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   Prior Functioning     Home Living Lives With: Spouse Available Help at Discharge: Family;Available 24 hours/day Type of Home: House Home Access: Stairs to enter [employer] of Steps: 4 Entrance Stairs-Rails: None Home Layout: Two level Alternate Level Stairs-Number of Steps: 20 (pt  sleeps downstairs) Bathroom Shower/Tub: Tub/shower unit;Curtain;Walk-in shower Bathroom  Toilet: Handicapped height Bathroom Accessibility: Yes How Accessible: Accessible via walker;Accessible via wheelchair Home Adaptive Equipment: Hand-held shower hose;Shower chair with back;Grab bars in shower Prior Function Level of Independence: Independent Able to Take Stairs?: Yes Driving: Yes 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H[employer] Dominant Hand: Right         Vision/Perception Vision - History Baseline Vision: Other (comment) (Herpes of Left eye for 30 years) Patient Visual Report: No change from baseline   Cognition  Cognition Overall Cognitive Status: Impaired Area of Impairment: Memory;Safety/judgement;Problem solving;Awareness of deficits Arousal/Alertness: Awake/alert Orientation Level: Oriented X4 / Intact Behavior During Session: Other (comment) (impulsive) Memory Deficits: relies heavily on wife for report of history (why he came in and home layout questions) Safety/Judgement: Decreased safety judgement for tasks assessed;Impulsive;Decreased awareness of need for assistance Awareness of Deficits: poor  Problem Solving: pt educated on the need for x2 assistance but reports being able to transfer independently    Extremity/Trunk Assessment Right Upper Extremity Assessment RUE ROM/Strength/Tone: Within functional levels RUE Sensation: WFL - Light Touch RUE Coordination:  (Brunstrom V- tremor, ataxic) Left Upper Extremity Assessment LUE ROM/Strength/Tone: Within functional levels LUE Sensation: WFL - Light Touch LUE Coordination:  (tremor- pt reports worse than baseline) Right Lower Extremity Assessment RLE ROM/Strength/Tone: Deficits RLE ROM/Strength/Tone Deficits: 4/5 RLE Sensation: WFL - Light Touch RLE Coordination: Deficits RLE Coordination Deficits: decreased coordination right leg for heel to shin test right compared to left.  Decreased functional  coordination while stepping around to chair (ataxic type movements, decreased ability to place foot where he wants it.   Left Lower Extremity Assessment LLE ROM/Strength/Tone: Within functional levels LLE Sensation: WFL - Light Touch LLE Coordination: WFL - gross/fine motor     Mobility Bed Mobility Bed Mobility: Supine to Sit;Sitting - Scoot to Edge of Bed Supine to Sit: 5: Supervision;With rails Sitting - Scoot to Edge of Bed: With rail;4: Min guard Details for Bed Mobility Assistance: supervision for safety due to pt tends to LOB to the right when moving.   Transfers Sit to Stand: 1: +2 Total assist;With upper extremity assist;From bed;From chair/3-in-1 Sit to Stand: Patient Percentage: 70% Stand to Sit: 1: +2 Total assist;With armrests;To bed;To chair/3-in-1;With upper extremity assist Stand to Sit: Patient Percentage: 70% Details for Transfer Assistance: Verbal cues for sequencing left weight shift and balance.       Exercise     Balance Static Sitting Balance Static Sitting - Balance Support: Bilateral upper extremity supported;Feet supported Static Sitting - Level of Assistance: 4: Min assist Dynamic Sitting Balance Dynamic Sitting - Balance Support: No upper extremity supported;Feet supported Dynamic Sitting - Level of Assistance: 3: Mod assist Dynamic Sitting Balance - Compensations: mod assist for LOB to the right while trying to donn socks long sitting in the bed.   Static Standing Balance Static Standing - Balance Support: Bilateral upper extremity supported Static Standing - Level of Assistance: 1: +2 Total assist;Patient percentage (comment) (70%) Dynamic Standing Balance Dynamic Standing - Balance Support: Bilateral upper extremity supported Dynamic Standing - Level of Assistance: 1: +2 Total assist Dynamic Standing - Balance Activities:  (while transferring to the chair)   End of Session OT - End of Session Activity Tolerance: Patient tolerated treatment  well Patient left: in chair;with call bell/phone within reach;with family/visitor present Nurse Communication: Mobility status;Precautions  GO     Lucile Shutters 10/01/2012, 4:21 PM Pager: [phone]

## 2012-10-01 NOTE — Progress Notes (Signed)
INITIAL NUTRITION ASSESSMENT  DOCUMENTATION CODES Per approved criteria  -Not Applicable   INTERVENTION:  Glucerna BID. Each supplement provides 220 kcal and 9.9 grams of protein.   NUTRITION DIAGNOSIS: Inadequate oral intake related to decreased appetite and taste alterations as evidenced by 25% po intake.   Goal: Pt to meet >/= 90% of estimated needs   Monitor:  PO intake, weight trends   Reason for Assessment: Malnutrition Screening Tool (MST=2)  79 y.o. male  Admitting Dx: Right sided weakness  ASSESSMENT: Pt is 79 yo male with PMH of DM, HYT, and atrial fibrillation. Pt off coumadin due to diverticulitis with GI bleed in December 2013. Pt presented to [facility] ED with right arm weakness and right face droopiness.  Pt reported to have a NIHSS 5 and a CT brain that showed no acute intracranial abnormality. Pt transferred to [facility] for further management.   Dietetic intern spoke with pt and pt's wife regarding nutrition history. Wife confirms 4-5 lb weight loss since December. Pt currently down 3 lbs from usual body weight of 200 lbs. This weight loss is not significant for the time frame.  Wife states that because of the GI bleed in December, pt has been off coumadin and trying other medications.  Wife reports pt has had multiple adverse reactions to these medications and feels that since this time, pt's appetite has not recovered. According to pt, he doesn't want to eat because "nothing tastes good."  Per wife, pt only consumed 25% of breakfast. Wife states pt drinks Glucerna at home and feels adding a supplement would help him meet his needs.   Height: Ht Readings from Last 1 Encounters:  09/30/12 5\' 11"  (1.803 m)    Weight: Wt Readings from Last 1 Encounters:  09/30/12 197 lb 8.5 oz (89.6 kg)    Ideal Body Weight: 172 lbs   % Ideal Body Weight: 114%  Wt Readings from Last 10 Encounters:  09/30/12 197 lb 8.5 oz (89.6 kg)    Usual Body Weight: 200 lbs   %  Usual Body Weight: 98%  BMI:  Body mass index is 27.56 kg/(m^2). overweight   Estimated Nutritional Needs: Kcal: 1900-2100 Protein: 90-105 g  Fluid: 1.9-2.1 L  Skin: intact  Diet Order: Carb Control  EDUCATION NEEDS: -No education needs identified at this time   Intake/Output Summary (Last 24 hours) at 10/01/12 1012 Last data filed at 10/01/12 0800  Gross per 24 hour  Intake 823.75 ml  Output    275 ml  Net 548.75 ml    Last BM: 09/29/2012   Labs:  No results found for this basename: NA, K, CL, CO2, BUN, CREATININE, CALCIUM, MG, PHOS, GLUCOSE,  in the last 168 hours  CBG (last 3)   Recent Labs  09/30/12 2349 10/01/12 0357 10/01/12 0825  GLUCAP 198* 157* 151*    Scheduled Meds: . insulin aspart  0-15 Units Subcutaneous TID WC    Continuous Infusions: . sodium chloride 75 mL/hr at 10/01/12 0800    Past Medical History  Diagnosis Date  . Hypertension associated with diabetes   . Diabetes   . Atrial fibrillation     Past Surgical History  Procedure Laterality Date  . Prostate surgery N/A 1995    estimate  . Tonsillectomy Bilateral 1930s    Belenda Cruise  Dietetic Intern Pager: [phone]

## 2012-10-02 DIAGNOSIS — I63112 Cerebral infarction due to embolism of left vertebral artery: Secondary | ICD-10-CM

## 2012-10-02 DIAGNOSIS — I482 Chronic atrial fibrillation, unspecified: Secondary | ICD-10-CM | POA: Diagnosis present

## 2012-10-02 DIAGNOSIS — I10 Essential (primary) hypertension: Secondary | ICD-10-CM | POA: Diagnosis present

## 2012-10-02 DIAGNOSIS — E119 Type 2 diabetes mellitus without complications: Secondary | ICD-10-CM | POA: Diagnosis present

## 2012-10-02 DIAGNOSIS — R0602 Shortness of breath: Secondary | ICD-10-CM | POA: Diagnosis present

## 2012-10-02 HISTORY — DX: Cerebral infarction due to embolism of left vertebral artery: I63.112

## 2012-10-02 LAB — BASIC METABOLIC PANEL
BUN: 33 mg/dL — ABNORMAL HIGH (ref 6–23)
GFR calc non Af Amer: 37 mL/min — ABNORMAL LOW (ref >90)
Glucose, Bld: 172 mg/dL — ABNORMAL HIGH (ref 70–99)
Potassium: 3.9 mEq/L (ref 3.5–5.1)

## 2012-10-02 LAB — GLUCOSE, CAPILLARY
Glucose-Capillary: 148 mg/dL — ABNORMAL HIGH (ref 70–99)
Glucose-Capillary: 146 mg/dL — ABNORMAL HIGH (ref 70–99)

## 2012-10-02 MED ORDER — BISACODYL 5 MG PO TBEC
5.0000 mg | DELAYED_RELEASE_TABLET | Freq: Every day | ORAL | Status: DC | PRN
  Administered 2012-10-02 – 2012-10-04 (×2): 5 mg via ORAL
  Filled 2012-10-02 (×3): qty 1

## 2012-10-02 MED ORDER — CALCIUM CARBONATE ANTACID 500 MG PO CHEW
1.0000 | CHEWABLE_TABLET | Freq: Two times a day (BID) | ORAL | Status: DC | PRN
  Administered 2012-10-02 – 2012-10-04 (×4): 400 mg via ORAL
  Administered 2012-10-05: 200 mg via ORAL
  Filled 2012-10-02 (×6): qty 2

## 2012-10-02 MED ORDER — ASPIRIN 325 MG PO TABS
325.0000 mg | ORAL_TABLET | Freq: Every day | ORAL | Status: DC
  Administered 2012-10-02 – 2012-10-03 (×2): 325 mg via ORAL
  Filled 2012-10-02 (×3): qty 1

## 2012-10-02 MED ORDER — ASPIRIN 325 MG PO TABS
325.0000 mg | ORAL_TABLET | Freq: Every day | ORAL | Status: DC
  Filled 2012-10-02: qty 1

## 2012-10-02 MED ORDER — WHITE PETROLATUM GEL
Status: AC
  Filled 2012-10-02: qty 5

## 2012-10-02 NOTE — Progress Notes (Signed)
Following pt for possible CIR admit.  CIR contact person will be Roderic Palau [phone]

## 2012-10-02 NOTE — Progress Notes (Signed)
Speech Language Pathology Treatment Patient Details Name: Joe Snow MRN: [id number] DOB: [birth year] Today's Date: 10/02/2012 Time: 1400-     Assessment / Plan / Recommendation Clinical Impression  No aphasia noted today in conversation with patient.  Patient does have short-term memory deficits, with good awareness of these deficits.  Patient reports his memory is worse now, than prior to the stroke.  Patient recalls events of the day, but does appears to have difficulty with new of new information.  Family in and patient with decreased attention to therapy tasks with family in room.  Family also with lots of questions regarding rehab. (whether CIR here or elsewhere).  Expressed interest in tx'ing to [place] area.  Primary cognitive deficits noted today are attention and memory.    SLP Plan  Goals updated    Pertinent Vitals/Pain n/a  SLP Goals  SLP Goals Potential to Achieve Goals: Good Progress/Goals/Alternative treatment plan discussed with pt/caregiver and they: Agree SLP Goal #1: Pt will verbalize at conversation level withmin verbal cues for word finding for 10 minutes SLP Goal #1 - Progress: Met SLP Goal #2 - Progress: Met SLP Goal #3: Patient will attend to therapy tasks for 20 minutes with min. redirection/verbal cues. SLP Goal #4: Pt. will participate in various functional short-term memory tasks with min. cues.  General Temperature Spikes Noted: No Respiratory Status: Room air Behavior/Cognition: Alert;Cooperative;Pleasant mood Oral Cavity - Dentition: Adequate natural dentition  Oral Cavity - Oral Hygiene Does patient have any of the following "at risk" factors?: None of the above Brush patient's teeth BID with toothbrush (using toothpaste with fluoride): Yes Patient is AT RISK - Oral Care Protocol followed (see row info): Yes   Treatment Treatment focused on: Cognition;Patient/family/caregiver education Family/Caregiver Educated: Daughters Skilled  Treatment: Diagnositic treatment   GO     Maryjo Rochester T 10/02/2012, 2:34 PM

## 2012-10-02 NOTE — Progress Notes (Signed)
Physical Therapy Treatment Patient Details Name: Joe Snow MRN: [id number] DOB: February 25, 1924 Today's Date: 10/02/2012 Time: 4098-1191 PT Time Calculation (min): 34 min  PT Assessment / Plan / Recommendation Comments on Treatment Session  79 y.o. male admitted to [facility] with R cerebellar and medulla stroke.  S/p tPA in ED.  He presents today wtih better ability to walk with assistive device, although two person assist needed and near constant cues for safety, sequencing and to help maneuver RW.  He continues to be an excellent inpatient rehabilitation candidate.      Follow Up Recommendations  CIR     Does the patient have the potential to tolerate intense rehabilitation    yes  Barriers to Discharge   none      Equipment Recommendations  Rolling walker with 5" wheels    Recommendations for Other Services   none  Frequency Min 4X/week   Plan Discharge plan remains appropriate;Frequency remains appropriate    Precautions / Restrictions Precautions Precautions: Fall Precaution Comments: decreased safety awareness Restrictions Weight Bearing Restrictions: No   Pertinent Vitals/Pain DOE 3/4 with gait, O2 sats remained stable on RA.      Mobility  Bed Mobility Bed Mobility: Supine to Sit;Sitting - Scoot to Edge of Bed Supine to Sit: 5: Supervision;With rails Sitting - Scoot to Edge of Bed: 5: Supervision;With rail Details for Bed Mobility Assistance: Pt falling the the Rt but progressing toward the EOB steadily Transfers Sit to Stand: 1: +2 Total assist;With upper extremity assist;From bed Sit to Stand: Patient Percentage: 80% Stand to Sit: 1: +2 Total assist;With upper extremity assist;To chair/3-in-1 Stand to Sit: Patient Percentage: 80% Details for Transfer Assistance: Pt requried max v/c for safety with widen base of support, hand placement . Pt with incr fall risk when turning to the right. Pt with poor awareness to bil LE positioning.  Pt also has a moderate anterior lean  in standing.   Ambulation/Gait Ambulation/Gait Assistance: 1: +2 Total assist Ambulation/Gait: Patient Percentage: 80% Ambulation Distance (Feet): 60 Feet (20' x 3) Assistive device: Rolling walker Ambulation/Gait Assistance Details: two person assist for safety, chair to follow and for left lateral wieght shift while walking.  Pt leaning right (increased with fatigue) with right leg buckling as well with fatigue.  Small BOS and anterior lean in standing.  Pt needs assist keeping RW close, verbal cuse for wide BOS, upright posture and safe foot sequencing.  When verbal cues taken away pt inconsistantly remembering correct sequence.   Gait Pattern: Step-through pattern;Trunk flexed;Narrow base of support (pt marching at first (not sure if this is due to sensation)) General Gait Details: left anterior, lean , small BOS, flexed posture, difficulty turning right > left.  Increased WOB with gait and mobility pt requiring rest breaks to breathe.  O2 sats > 90 on RA, DOE 3/4.  Pt instructed on PLB during seated rest breaks.       PT Goals Acute Rehab PT Goals PT Goal: Supine/Side to Sit - Progress: Progressing toward goal PT Goal: Sit to Stand - Progress: Progressing toward goal PT Goal: Stand to Sit - Progress: Progressing toward goal PT Goal: Ambulate - Progress: Progressing toward goal  Visit Information  Last PT Received On: 10/02/12 Assistance Needed: +2 PT/OT Co-Evaluation/Treatment: Yes    Subjective Data  Subjective: Pt joking again today, but able to accurately report the events of this AM.   Patient Stated Goal: pt wants to be able to get into the bathroom   Cognition  Cognition  Overall Cognitive Status: Impaired Area of Impairment: Memory;Safety/judgement;Problem solving;Awareness of deficits Arousal/Alertness: Awake/alert Orientation Level: Appears intact for tasks assessed Behavior During Session: [facility] for tasks performed Memory Deficits: pt states "left side is the strong  side" when asked this session. Pt did make error once during session stating "right side" Safety/Judgement: Decreased safety judgement for tasks assessed;Decreased awareness of safety precautions Awareness of Deficits: poor  Cognition - Other Comments: inconsistantly remembering safety cues dispite near constant reinforcement.      Balance  Dynamic Sitting Balance Dynamic Sitting - Balance Support: No upper extremity supported;Feet supported Dynamic Sitting - Level of Assistance: 5: Stand by assistance Dynamic Sitting Balance - Compensations: 2 mins in prep for standing.   Static Standing Balance Static Standing - Balance Support: Bilateral upper extremity supported Static Standing - Level of Assistance: 1: +2 Total assist (pt 80%) Static Standing - Comment/# of Minutes: ~ pt with fatigue after ambulation and prolonged static standing leaning to the Rt with bil feet together.    End of Session PT - End of Session Equipment Utilized During Treatment: Gait belt Activity Tolerance: Patient limited by fatigue Patient left: in chair;with call bell/phone within reach Nurse Communication: Mobility status        Lurena Joiner B. Woodward Klem, PT, DPT [phone]   10/02/2012, 11:25 AM

## 2012-10-02 NOTE — Progress Notes (Signed)
Nursing Note: Patient had an episode of SOB, very anxious with the feeling.Prior to the SOB he positioned him self in the bed with little exertion then started with the SOB. Patient states he cannot get his breath, VS stayed stable and O2 level at 99% with 2 liters of O2 [place]. Stayed with the patient until the feelings subsided. Lung sounds are clear and equal bilaterally. MD was made aware of this happening on 3/4 and consulted cardiology.

## 2012-10-02 NOTE — Clinical Social Work Note (Addendum)
Clinical Social Work Department CLINICAL SOCIAL WORK PLACEMENT NOTE 10/02/2012  Patient:  Joe Snow, Joe Snow  Account Number:  [id number] Admit date:  09/30/2012  Clinical Social Worker:  Macario Golds, LCSW  Date/time:  10/02/2012 04:00 PM  Clinical Social Work is seeking post-discharge placement for this patient at the following level of care:   SKILLED NURSING   (*CSW will update this form in Epic as items are completed)   10/02/2012  Patient/family provided with [facility] Department of Clinical Social Work's list of facilities offering this level of care within the geographic area requested by the patient (or if unable, by the patient's family).  10/02/2012  Patient/family informed of their freedom to choose among providers that offer the needed level of care, that participate in Medicare, Medicaid or managed care program needed by the patient, have an available bed and are willing to accept the patient.  10/02/2012  Patient/family informed of [facility]' ownership interest in [facility], as well as of the fact that they are under no obligation to receive care at this facility.  PASARR submitted to EDS on 10/02/2012 PASARR number received from EDS on 10/02/2012  FL2 transmitted to all facilities in geographic area requested by pt/family on  10/02/2012 FL2 transmitted to all facilities within larger geographic area on   Patient informed that his/her managed care company has contracts with or will negotiate with  certain facilities, including the following:     Patient/family informed of bed offers received:  10/13/2012 [facility]) Patient chooses bed at [facility]) Physician recommends and patient chooses bed at  SNF  Patient to be transferred to [facility]) Patient to be transferred to facility by Sharin [facility])  The following physician request were entered in Epic:   Additional Comments: 03/05 Patient wife hopeful for [facility] [place],  [place] [phone]  Dede Query, MSW, Theresia Majors [phone]

## 2012-10-02 NOTE — Clinical Social Work Note (Signed)
Clinical Social Work Department BRIEF PSYCHOSOCIAL ASSESSMENT 10/02/2012  Patient:  Joe Snow, Joe Snow     Account Number:  [id number]     Admit date:  09/30/2012  Clinical Social Worker:  Verl Blalock  Date/Time:  10/02/2012 04:00 PM  Referred by:  RN  Date Referred:  10/02/2012 Referred for  SNF Placement   Other Referral:   Interview type:  Patient Other interview type:   Patient wife outside of room, daughters present at bedside    PSYCHOSOCIAL DATA Living Status:  WIFE Admitted from facility:   Level of care:   Primary support name:  Blanchette,[name]   [phone] Primary support relationship to patient:  SPOUSE Degree of support available:   Strong    CURRENT CONCERNS Current Concerns  Post-Acute Placement   Other Concerns:    SOCIAL WORK ASSESSMENT / PLAN Clinical Social Worker met with patient wife outside of patient room to offer support and discuss patient needs at discharge.  Patient wife states that patient was living with her and fully independent prior to hospitalization. Patient wife understands that patient will have rehab needs at discharge and is agreeable to initiate SNF search in [facility].  CSW to initiate search and follow up with patient wife regarding bed offers.  Patient family has preference to [facility]	[place] - CSW to contact.  Patient wife remains willing to entertain the idea of inpatient rehab at this time.  Patient wife expecting follow up from inpatient rehab admissions coordinator.  CSW remains available for support and to facilitate patient discharge needs once medically stable.   Assessment/plan status:  Psychosocial Support/Ongoing Assessment of Needs Other assessment/ plan:   Information/referral to community resources:   Visual merchandiser provided patient wife with facility list and offered resources regarding SNF vs. Rehab.    PATIENT'S/FAMILY'S RESPONSE TO PLAN OF CARE: Patient alert and oriented x3 in the bed.  CSW  introduced herself and communicated with patient wife in the hallway regarding patient rehab needs.  Patient wife realistic about patient long term goals and is hopeful for patient return home following rehab.  Patient with 24 hour support from family as needed once discharged.  Patient wife verbalized her appreciation for CSW support and concern.    Macario Golds, [place] [phone]

## 2012-10-02 NOTE — Consult Note (Signed)
Physical Medicine and Rehabilitation Consult Reason for Consult: CVA Referring Physician: Dr. Pearlean Brownie   HPI: Joe Snow is a 79 y.o. right-handed male was documented history of hypertension, diabetes mellitus and atrial fibrillation off Coumadin due to diverticulitis with GI bleeding December 2013. Patient admitted 09/30/2012 with right-sided weakness and facial droop. He was initially taken to [facility] with cranial CT scan showing no acute intracranial abnormality. He was transferred to [facility] for further management. Patient did receive TPA at [facility]. Neurology service followup with workup ongoing and presently maintained on aspirin therapy. MRI of the brain 3/4 Small acute non hemorrhagic infarcts inferior aspect of  the right cerebellum and right posterior lateral aspect of the  medulla.  Carotid Dopplers with no ICA stenosis. Cardiology followup for history of atrial fibrillation and await echocardiogram results. Followup speech therapy for mild expressive aphasia and currently maintained on a regular consistency diet. Occupational therapy evaluation completed 10/01/2012 with recommendations for physical medicine rehabilitation consult to consider inpatient rehabilitation services   Review of Systems  Cardiovascular: Positive for palpitations.  Musculoskeletal: Positive for myalgias.  All other systems reviewed and are negative.   Past Medical History  Diagnosis Date  . Hypertension associated with diabetes   . Diabetes   . Atrial fibrillation    Past Surgical History  Procedure Laterality Date  . Prostate surgery N/A 1995    estimate  . Tonsillectomy Bilateral 1930s   History reviewed. No pertinent family history. Social History:  reports that he quit smoking about 34 years ago. His smoking use included Cigarettes. He smoked 0.00 packs per day. He has never used smokeless tobacco. He reports that he drinks about 1.0 ounces of alcohol per  week. He reports that he does not use illicit drugs. Allergies: No Known Allergies No prescriptions prior to admission    Home: Home Living Lives With: Spouse Available Help at Discharge: Family;Available 24 hours/day Type of Home: House Home Access: Stairs to enter [employer] of Steps: 4 Entrance Stairs-Rails: None Home Layout: Two level Alternate Level Stairs-Number of Steps: 20 (pt sleeps downstairs) Bathroom Shower/Tub: Tub/shower unit;Curtain;Walk-in shower Bathroom Toilet: Handicapped height Bathroom Accessibility: Yes How Accessible: Accessible via walker;Accessible via wheelchair Home Adaptive Equipment: Hand-held shower hose;Shower chair with back;Grab bars in shower  Functional History: Prior Function Able to Take Stairs?: Yes Driving: Yes Vocation: Retired Functional Status:  Mobility: Bed Mobility Bed Mobility: Supine to Sit;Sitting - Scoot to Edge of Bed Supine to Sit: 5: Supervision;With rails Sitting - Scoot to Edge of Bed: With rail;4: Min guard Transfers Transfers: Sit to Stand;Stand to [employer] Transfers Sit to Stand: 1: +2 Total assist;With upper extremity assist;From bed;From chair/3-in-1 Sit to Stand: Patient Percentage: 70% Stand to Sit: 1: +2 Total assist;With armrests;To bed;To chair/3-in-1;With upper extremity assist Stand to Sit: Patient Percentage: 70% Stand Pivot Transfers: 1: +2 Total assist Stand Pivot Transfers: Patient Percentage: 70%      ADL: ADL Eating/Feeding: Maximal assistance (bil Hand with spillage) Where Assessed - Eating/Feeding: Chair Grooming: Wash/dry face;Maximal assistance Where Assessed - Grooming: Supported sitting Lower Body Dressing: Minimal assistance (long sitting in bed with LOB x3 to the Right) Where Assessed - Lower Body Dressing: Supine, head of bed flat Toilet Transfer: +2 Total assistance Toilet Transfer Method: Sit to stand Toilet Transfer Equipment: Raised toilet seat with arms (or  3-in-1 over toilet) Equipment Used: Gait belt Transfers/Ambulation Related to ADLs: Pt completed transfer to the Lt EOB <>chair then back to bed then back to  chair again to demonstrate need for assistance and left side strength.  ADL Comments: Pt don socks long sitting in bed, lack of awareness of deficits, referring to wife for (A) and poor recall of education in session. Pt high fall risk. Pt insist on walking to the bathroom and required multiple attempts to educate patient on the need to use urinal. Pt successful voided total +2  total (A) to hold urinal.   Cognition: Cognition Overall Cognitive Status: Appears within functional limits for tasks assessed Arousal/Alertness: Awake/alert Orientation Level: Oriented X4 Attention: Focused;Sustained;Selective;Alternating Focused Attention: Appears intact Sustained Attention: Appears intact Selective Attention: Appears intact Alternating Attention: Impaired Alternating Attention Impairment: Verbal complex;Functional complex Memory: Impaired Memory Impairment: Storage deficit;Decreased recall of new information Awareness: Impaired Awareness Impairment: Anticipatory impairment Executive Function: Reasoning Reasoning: Impaired Reasoning Impairment: Verbal complex Cognition Overall Cognitive Status: Impaired Area of Impairment: Memory;Safety/judgement;Problem solving;Awareness of deficits Arousal/Alertness: Awake/alert Orientation Level: Oriented X4 / Intact Behavior During Session: Other (comment) (impulsive) Memory Deficits: relies heavily on wife for report of history (why he came in and home layout questions) Safety/Judgement: Decreased safety judgement for tasks assessed;Impulsive;Decreased awareness of need for assistance Awareness of Deficits: poor  Problem Solving: pt educated on the need for x2 assistance but reports being able to transfer independently  Blood pressure 151/85, pulse 72, temperature 98.4 F (36.9 C), temperature  source Oral, resp. rate 21, height 5\' 11"  (1.803 m), weight 89.6 kg (197 lb 8.5 oz), SpO2 94.00%. Physical Exam  Vitals reviewed. Constitutional: He appears well-developed.  HENT:  Head: Normocephalic.  Eyes: EOM are normal.  Neck: Normal range of motion. Neck supple. No thyromegaly present.  Cardiovascular:   Cardiac rate controlled  Pulmonary/Chest: Breath sounds normal. No respiratory distress.  Abdominal: Soft. Bowel sounds are normal. He exhibits no distension.  Musculoskeletal: He exhibits no edema.  Neurological: He is alert.  Patient was appropriate for basic conversation. He was able to name person, place and date of birth. He followed three-step commands  Skin: Skin is warm and dry.    Results for orders placed during the hospital encounter of 09/30/12 (from the past 24 hour(s))  GLUCOSE, CAPILLARY     Status: Abnormal   Collection Time    10/01/12  8:25 AM      Result Value Range   Glucose-Capillary 151 (*) 70 - 99 mg/dL  GLUCOSE, CAPILLARY     Status: Abnormal   Collection Time    10/01/12 11:34 AM      Result Value Range   Glucose-Capillary 173 (*) 70 - 99 mg/dL  GLUCOSE, CAPILLARY     Status: Abnormal   Collection Time    10/01/12  5:26 PM      Result Value Range   Glucose-Capillary 182 (*) 70 - 99 mg/dL   Comment 1 Notify RN     Comment 2 Documented in Chart    BASIC METABOLIC PANEL     Status: Abnormal   Collection Time    10/02/12  4:35 AM      Result Value Range   Sodium 141  135 - 145 mEq/L   Potassium 3.9  3.5 - 5.1 mEq/L   Chloride 106  96 - 112 mEq/L   CO2 27  19 - 32 mEq/L   Glucose, Bld 172 (*) 70 - 99 mg/dL   BUN 33 (*) 6 - 23 mg/dL   Creatinine, Ser 1.61 (*) 0.50 - 1.35 mg/dL   Calcium 8.9  8.4 - 09.6 mg/dL   GFR calc non  Af Amer 37 (*) >90 mL/min   GFR calc Af Amer 43 (*) >90 mL/min   Dg Chest Port 1 View  09/30/2012  *RADIOLOGY REPORT*  Clinical Data: Nausea vomiting.  Shortness of breath.  PORTABLE CHEST - 1 VIEW  Comparison: None.   Findings: 1939 hours. The cardiopericardial silhouette is enlarged. Interstitial markings are diffusely coarsened with chronic features.  No overt airspace pulmonary edema or focal lung consolidation.  No substantial pleural effusion although left costophrenic angle has not been included on the film. Imaged bony structures of the thorax are intact. Telemetry leads overlie the chest.  IMPRESSION: Cardiomegaly with underlying chronic interstitial coarsening.   Original Report Authenticated By: Kennith Center, M.D.     Assessment/Plan: Diagnosis: Right hemi ataxia with Small acute non hemorrhagic infarcts inferior aspect of  the right cerebellum and right posterior lateral aspect of the  medulla.  1. Does the need for close, 24 hr/day medical supervision in concert with the patient's rehab needs make it unreasonable for this patient to be served in a less intensive setting? Yes 2. Co-Morbidities requiring supervision/potential complications: Chronic a fib, dyspnea 3. Due to bladder management, bowel management, safety, skin/wound care, disease management, medication administration and patient education, does the patient require 24 hr/day rehab nursing? Yes 4. Does the patient require coordinated care of a physician, rehab nurse, PT (1-2 hrs/day, 5 days/week) and OT (1-2 hrs/day, 5 days/week) to address physical and functional deficits in the context of the above medical diagnosis(es)? Yes Addressing deficits in the following areas: balance, endurance, locomotion, strength, transferring, bowel/bladder control, bathing, dressing, feeding, grooming and toileting 5. Can the patient actively participate in an intensive therapy program of at least 3 hrs of therapy per day at least 5 days per week? Yes 6. The potential for patient to make measurable gains while on inpatient rehab is good 7. Anticipated functional outcomes upon discharge from inpatient rehab are Supervision mobility with PT, Supervision ADLs with  OT, Not applicable with SLP. 8. Estimated rehab length of stay to reach the above functional goals is: 2 weeks 9. Does the patient have adequate social supports to accommodate these discharge functional goals? Potentially 10. Anticipated D/C setting: Home 11. Anticipated post D/C treatments: HH therapy 12. Overall Rehab/Functional Prognosis: good  RECOMMENDATIONS: This patient's condition is appropriate for continued rehabilitative care in the following setting: CIR Patient has agreed to participate in recommended program. Yes Note that insurance prior authorization may be required for reimbursement for recommended care.  Comment:Recommend pulmonary consult for continued shortness of breath. No obvious cardiac source    10/02/2012

## 2012-10-02 NOTE — Progress Notes (Signed)
Stroke Team Progress Note  HISTORY Joe Snow is an 79 y.o. male with a past medical history significant for hypertension, diabetes, atrial fibrillation off coumadin due to diverticulitis with GI bleeding December 2013, who was in his usual state of health until 12:45 pm today 09/30/2012 when he started vomiting and was noted to have right arm weakness and right face droopiness.  He was taken to [facility] ED where he reported to have a NIHSS 5 and a CT brain that showed no acute intracranial abnormality.  There was a concerned about patient's bleeding last December as well as very elevated systolic blood pressure which apparently resulted in a delay in administering thrombolytics. I was informed by the ED physician that patient's daughter was demanding treatment with IV thrombolytics even when he was close to the 4 hours after onset of symptoms. He was transferred to [facility] for further management.  Upon arrival to the NICU his NIHSS was still 5, and he was complaining of a mild headache.  SUBJECTIVE Patient sitting up in bed, eating breakfast.  OBJECTIVE Most recent Vital Signs: Filed Vitals:   10/02/12 0411 10/02/12 0500 10/02/12 0600 10/02/12 0700  BP:  166/69 151/85 159/87  Pulse:  58 72 68  Temp: 98.4 F (36.9 C)     TempSrc: Oral     Resp:  21 21 14   Height:      Weight:      SpO2:  95% 94% 97%   CBG (last 3)   Recent Labs  10/01/12 0825 10/01/12 1134 10/01/12 1726  GLUCAP 151* 173* 182*    IV Fluid Intake:   . sodium chloride 75 mL/hr at 10/02/12 0700    MEDICATIONS  . aspirin  325 mg Oral Daily  . feeding supplement  237 mL Oral BID BM  . insulin aspart  0-15 Units Subcutaneous TID WC  . lisinopril  5 mg Oral Daily  . simvastatin  5 mg Oral q1800  . white petrolatum       PRN:  ondansetron, senna-docusate  Diet:  Carb Control thin liquids Activity:  OOB with assistance DVT Prophylaxis:  SCDs   CLINICALLY SIGNIFICANT STUDIES Basic  Metabolic Panel:   Recent Labs Lab 10/02/12 0435  NA 141  K 3.9  CL 106  CO2 27  GLUCOSE 172*  BUN 33*  CREATININE 1.61*  CALCIUM 8.9   Liver Function Tests: No results found for this basename: AST, ALT, ALKPHOS, BILITOT, PROT, ALBUMIN,  in the last 168 hours CBC: No results found for this basename: WBC, NEUTROABS, HGB, HCT, MCV, PLT,  in the last 168 hours Coagulation: No results found for this basename: LABPROT, INR,  in the last 168 hours Cardiac Enzymes: No results found for this basename: CKTOTAL, CKMB, CKMBINDEX, TROPONINI,  in the last 168 hours Urinalysis: No results found for this basename: COLORURINE, APPERANCEUR, LABSPEC, PHURINE, GLUCOSEU, HGBUR, BILIRUBINUR, KETONESUR, PROTEINUR, UROBILINOGEN, NITRITE, LEUKOCYTESUR,  in the last 168 hours Lipid Panel    Component Value Date/Time   CHOL 125 10/01/2012 0325   TRIG 46 10/01/2012 0325   HDL 51 10/01/2012 0325   CHOLHDL 2.5 10/01/2012 0325   VLDL 9 10/01/2012 0325   LDLCALC 65 10/01/2012 0325   HgbA1C  Lab Results  Component Value Date   HGBA1C 8.0* 10/01/2012    Urine Drug Screen:   No results found for this basename: labopia,  cocainscrnur,  labbenz,  amphetmu,  thcu,  labbarb    Alcohol Level: No results found for this  basename: ETH,  in the last 168 hours  CT of the brain    MRI of the brain  10/02/2012    Small acute non hemorrhagic infarcts inferior aspect of the right cerebellum and right posterior lateral aspect of the medulla.   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  Intracranial atherosclerotic type changes  2D Echocardiogram  EF 60-65% with no source of embolus.   Carotid Doppler  Bilateral: No evidence of hemodynamically significant internal carotid artery stenosis. Vertebral artery flow is antegrade. Right vertebral artery waveform is spiked with loss of diastolic component.  CXR  09/30/2012   Cardiomegaly with underlying chronic interstitial coarsening.   EKG     Therapy  Recommendations CIR  Physical Exam   Pleasant middle aged caucasian male not in distress.Awake alert. Afebrile. Head is nontraumatic. Neck is supple without bruit. Hearing is normal. Cardiac exam no murmur or gallop. Lungs are clear to auscultation. Distal pulses are well felt. Neurological Exam ; a wake and alert oriented x3 with normal speech and language. Extraocular moments are full range without nystagmus. Vision acuity and fields appear normal. Fundi were not visualized. There is no facial weakness. There is no dysarthria. Tongue is midline. Motor system exam reveals no upper or lower extremity drift. Fine finger movements are diminished on the right. There is mild finger-to-nose dysmetria on the right. Right grip is weaker than the left. He orbits left over right approximately. No lower extremity weakness. Sensation appears normal. Gait was not tested.   NIHSS 1.  ASSESSMENT [name] is a 79 y.o. male presenting with right arm and face weakness. Status post IV t-PA 09/30/2012 at 1630 at [facility], transferred to [facility]. tPA received 3h and 45 mins post symptom onset. 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 Stroke thromboembolic secondary to atrial fibrillation and right vertebral artery occlusion.  Off warfarin since Dec 2013 for GIB. Now on aspirin 325 mg orally every day for secondary stroke prevention. Patient with resultant right arm hemiparesis, facial droop, dysarthria. Work up completed.   Hypertension, 170-180s Diabetes, HgbA1c 8.0 atrial fibrillation, taken off coumadin Dec 2013 due to GIB. Cardiology recommends resumption of coumadin. Patient does not want to go back on coumadin. NOAC discussed with patient. He wants to discuss further with his wife. LDL 65 Hx diverticulitis/GIB 06/2012 taken off coumadin at that time  SOB episodes, ? Etiology. Cardiology consult does not clearly identify  source. They recommend NO Beta blockers with bradycardia and extreme fatique.  Hospital day # 2  TREATMENT/PLAN  Continue aspirin for now. Recommend anticoagulant (prefer newer anticoagulants) for secondary stroke prevention, otherwise, will consider aspirin and plavix. Will discuss further wife he and his wife.  Transfer to the floor  Rehab when bed available  Annie Main, MSN, RN, ANVP-BC, ANP-BC, GNP-BC [facility] Pager: [phone] 10/02/2012 7:50 AM  I have personally obtained a history, examined the patient, evaluated imaging results, and formulated the assessment and plan of care. I agree with the above.  Delia Heady, MD Medical Director [facility] Pager: [phone] 10/02/2012 7:50 AM

## 2012-10-02 NOTE — Progress Notes (Signed)
Occupational Therapy Treatment Patient Details Name: Joe Snow MRN: [id number] DOB: June 01, 1924 Today's Date: 10/02/2012 Time: 7829-5621 OT Time Calculation (min): 34 min  OT Assessment / Plan / Recommendation Comments on Treatment Session Pt demonstrates incr mobility and incr awareness to Rt side deficits this session. Pt progressing well and remains CIR candidate    Follow Up Recommendations  CIR    Barriers to Discharge       Equipment Recommendations  3 in 1 bedside comode    Recommendations for Other Services Rehab consult  Frequency Min 3X/week   Plan Discharge plan remains appropriate    Precautions / Restrictions Precautions Precautions: Fall Precaution Comments: decreased safety awareness Restrictions Weight Bearing Restrictions: No   Pertinent Vitals/Pain     ADL  Eating/Feeding: Maximal assistance Where Assessed - Eating/Feeding: Chair (uncontrolled Rt UE AROM) Grooming: Wash/dry face;Minimal assistance (pt overshooting and slapping face attempting to blow nose) Where Assessed - Grooming: Supported sitting Toilet Transfer: +2 Total assistance Toilet Transfer: Patient Percentage: 80% Toilet Transfer Method: Sit to [employer]: Raised toilet seat with arms (or 3-in-1 over toilet) Toileting - Clothing Manipulation and Hygiene: +1 Total assistance Where Assessed - Toileting Clothing Manipulation and Hygiene: Sit to stand from 3-in-1 or toilet Equipment Used: Gait belt;Rolling walker Transfers/Ambulation Related to ADLs: Pt with ataxic gait with mobility. Pt encouraged to verbalize sequence out loud to incr following sequence. Pt stomping with BIL feet during ambulation . Pt with narrowed base of support and needed max v/c to widen base. pt falling to the Rt with narrowed base. ADL Comments: Pt demonstrates incr bed mobility, incr ambulation, completed toilet transfer and incr awareness to Rt side weakness this session. Pt remains fall risk.  Pt is a strong CIR candidate.    OT Diagnosis:    OT Problem List:   OT Treatment Interventions:     OT Goals Acute Rehab OT Goals OT Goal Formulation: With patient/family Time For Goal Achievement: 10/15/12 Potential to Achieve Goals: Good ADL Goals Pt Will Perform Grooming: with min assist;Supported;Sitting, chair ADL Goal: Grooming - Progress: Met Pt Will Transfer to Toilet: with max assist;Stand pivot transfer;3-in-1 ADL Goal: Toilet Transfer - Progress: Progressing toward goals Pt Will Perform Toileting - Hygiene: with mod assist;Sit to stand from 3-in-1/toilet ADL Goal: Toileting - Hygiene - Progress: Progressing toward goals Miscellaneous OT Goals Miscellaneous OT Goal #1: Pt will use visual input attending to Rt UE for hand to mouth ADLS OT Goal: Miscellaneous Goal #1 - Progress: Progressing toward goals  Visit Information  Last OT Received On: 10/02/12 Assistance Needed: +2 PT/OT Co-Evaluation/Treatment: Yes    Subjective Data      Prior Functioning       Cognition  Cognition Overall Cognitive Status: Impaired Area of Impairment: Memory;Safety/judgement;Problem solving;Awareness of deficits Arousal/Alertness: Awake/alert Orientation Level: Appears intact for tasks assessed Behavior During Session: [facility] for tasks performed Memory Deficits: pt states "left side is the strong side" when asked this session. Pt did make error once during session stating "right side" Safety/Judgement: Decreased safety judgement for tasks assessed;Decreased awareness of safety precautions Awareness of Deficits: poor     Mobility  Bed Mobility Bed Mobility: Supine to Sit;Sitting - Scoot to Edge of Bed Supine to Sit: 5: Supervision;With rails Sitting - Scoot to Edge of Bed: 5: Supervision;With rail Details for Bed Mobility Assistance: Pt falling the the Rt but progressing toward the EOB steadily Transfers Transfers: Sit to Stand;Stand to Sit Sit to Stand: 1: +2 Total assist;With  upper  extremity assist;From bed Sit to Stand: Patient Percentage: 80% Stand to Sit: 1: +2 Total assist;With upper extremity assist;To chair/3-in-1 Stand to Sit: Patient Percentage: 80% Details for Transfer Assistance: Pt requried max v/c for safety with widen base of support, hand placement . Pt with incr fall risk when turning to the right. Pt with poor awareness to bil LE positioning    Exercises      Balance Dynamic Sitting Balance Dynamic Sitting - Balance Support: No upper extremity supported;Feet supported Dynamic Sitting - Level of Assistance: 5: Stand by assistance Static Standing Balance Static Standing - Balance Support: Bilateral upper extremity supported;During functional activity Static Standing - Level of Assistance: 1: +2 Total assist (80%) Static Standing - Comment/# of Minutes: ~ pt with fatigue after ambulation and prolonged static standing leaning to the Rt    End of Session OT - End of Session Activity Tolerance: Patient tolerated treatment well Patient left: in chair;with call bell/phone within reach;Other (comment) (tech in room) Nurse Communication: Mobility status;Precautions  GO     Lucile Shutters 10/02/2012, 10:50 AM Pager: [phone]

## 2012-10-03 ENCOUNTER — Encounter: Payer: Self-pay | Admitting: Internal Medicine

## 2012-10-03 LAB — GLUCOSE, CAPILLARY
Glucose-Capillary: 133 mg/dL — ABNORMAL HIGH (ref 70–99)
Glucose-Capillary: 157 mg/dL — ABNORMAL HIGH (ref 70–99)
Glucose-Capillary: 147 mg/dL — ABNORMAL HIGH (ref 70–99)

## 2012-10-03 MED ORDER — RIVAROXABAN 15 MG PO TABS
15.0000 mg | ORAL_TABLET | Freq: Every day | ORAL | Status: DC
  Administered 2012-10-03 – 2012-10-08 (×5): 15 mg via ORAL
  Filled 2012-10-03 (×7): qty 1

## 2012-10-03 NOTE — Progress Notes (Signed)
Stroke Team Progress Note  HISTORY Joe Snow is an 79 y.o. male with a past medical history significant for hypertension, diabetes, atrial fibrillation off coumadin due to diverticulitis with GI bleeding December 2013, who was in his usual state of health until 12:45 pm today 09/30/2012 when he started vomiting and was noted to have right arm weakness and right face droopiness.  He was taken to [facility] ED where he reported to have a NIHSS 5 and a CT brain that showed no acute intracranial abnormality.  There was a concerned about patient's bleeding last December as well as very elevated systolic blood pressure which apparently resulted in a delay in administering thrombolytics. I was informed by the ED physician that patient's daughter was demanding treatment with IV thrombolytics even when he was close to the 4 hours after onset of symptoms. He was transferred to [facility] for further management.  Upon arrival to the NICU his NIHSS was still 5, and he was complaining of a mild headache.  SUBJECTIVE No family is at his bedside. He is resting in bed.  OBJECTIVE Most recent Vital Signs: Filed Vitals:   10/02/12 1800 10/02/12 2200 10/03/12 0200 10/03/12 0600  BP: 162/129 161/84 148/76 133/58  Pulse: 73 66 65 68  Temp:  98.1 F (36.7 C) 98.6 F (37 C) 99 F (37.2 C)  TempSrc:  Oral Oral Oral  Resp: 14 18 17 18   Height:      Weight:      SpO2: 99% 98% 96% 97%   CBG (last 3)   Recent Labs  10/02/12 1707 10/02/12 2219 10/03/12 0628  GLUCAP 146* 180* 157*   IV Fluid Intake:     MEDICATIONS  . aspirin  325 mg Oral Daily  . feeding supplement  237 mL Oral BID BM  . insulin aspart  0-15 Units Subcutaneous TID WC  . lisinopril  5 mg Oral Daily  . simvastatin  5 mg Oral q1800   PRN:  bisacodyl, calcium carbonate, ondansetron, senna-docusate  Diet:  Carb Control thin liquids Activity:  OOB with assistance DVT Prophylaxis:  SCDs   CLINICALLY SIGNIFICANT  STUDIES Basic Metabolic Panel:   Recent Labs Lab 10/02/12 0435  NA 141  K 3.9  CL 106  CO2 27  GLUCOSE 172*  BUN 33*  CREATININE 1.61*  CALCIUM 8.9   Liver Function Tests: No results found for this basename: AST, ALT, ALKPHOS, BILITOT, PROT, ALBUMIN,  in the last 168 hours CBC: No results found for this basename: WBC, NEUTROABS, HGB, HCT, MCV, PLT,  in the last 168 hours Coagulation: No results found for this basename: LABPROT, INR,  in the last 168 hours Cardiac Enzymes: No results found for this basename: CKTOTAL, CKMB, CKMBINDEX, TROPONINI,  in the last 168 hours Urinalysis: No results found for this basename: COLORURINE, APPERANCEUR, LABSPEC, PHURINE, GLUCOSEU, HGBUR, BILIRUBINUR, KETONESUR, PROTEINUR, UROBILINOGEN, NITRITE, LEUKOCYTESUR,  in the last 168 hours Lipid Panel    Component Value Date/Time   CHOL 125 10/01/2012 0325   TRIG 46 10/01/2012 0325   HDL 51 10/01/2012 0325   CHOLHDL 2.5 10/01/2012 0325   VLDL 9 10/01/2012 0325   LDLCALC 65 10/01/2012 0325   HgbA1C  Lab Results  Component Value Date   HGBA1C 8.0* 10/01/2012    Urine Drug Screen:   No results found for this basename: labopia,  cocainscrnur,  labbenz,  amphetmu,  thcu,  labbarb    Alcohol Level: No results found for this basename: ETH,  in the  last 168 hours  CT of the brain    MRI of the brain  10/02/2012    Small acute non hemorrhagic infarcts inferior aspect of the right cerebellum and right posterior lateral aspect of the medulla.   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  Intracranial atherosclerotic type changes  2D Echocardiogram  EF 60-65% with no source of embolus.   Carotid Doppler  Bilateral: No evidence of hemodynamically significant internal carotid artery stenosis. Vertebral artery flow is antegrade. Right vertebral artery waveform is spiked with loss of diastolic component.  CXR  09/30/2012   Cardiomegaly with underlying chronic interstitial coarsening.   EKG      Therapy Recommendations CIR  Physical Exam   Pleasant middle aged caucasian male not in distress.Awake alert. Afebrile. Head is nontraumatic. Neck is supple without bruit. Hearing is normal. Cardiac exam no murmur or gallop. Lungs are clear to auscultation. Distal pulses are well felt. Neurological Exam ; a wake and alert oriented x3 with normal speech and language. Extraocular moments are full range without nystagmus. Vision acuity and fields appear normal. Fundi were not visualized. There is no facial weakness. There is no dysarthria. Tongue is midline. Motor system exam reveals no upper or lower extremity drift. Fine finger movements are diminished on the right. There is mild finger-to-nose dysmetria on the right. Right grip is weaker than the left. He orbits left over right approximately. No lower extremity weakness. Sensation appears normal. Gait was not tested.   ASSESSMENT [name] is a 79 y.o. male presenting with right arm and face weakness. Status post IV t-PA 09/30/2012 at 1630 at [facility], transferred to [facility]. tPA received 3h and 45 mins post symptom onset. 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 Stroke thromboembolic secondary to atrial fibrillation and right vertebral artery occlusion.  Off warfarin since Dec 2013 for GIB. Now on aspirin 325 mg orally every day for secondary stroke prevention. Patient with resultant right arm hemiparesis, facial droop, dysarthria. Work up completed.   Hypertension, 170-180s Diabetes, HgbA1c 8.0 atrial fibrillation, taken off coumadin Dec 2013 due to GIB. Cardiology recommends resumption of coumadin. Patient does not want to go back on coumadin. NOAC discussed with patient. He wants to discuss further with his wife. LDL 65 Hx diverticulitis/GIB 06/2012 taken off coumadin at that time  Patient and wife report SOB episodes, ? Etiology. No physical changes  occurs with episodes - sats and vital signs remain stable. Cardiology consult does not clearly identify source. They recommend NO Beta blockers with bradycardia and extreme fatique. No indication for inpatient pulmonary evaluation.  Hospital day # 3  TREATMENT/PLAN  Change to xarelto for secondary stroke prevention. Dr Pearlean Brownie discussed with wife.  Rehab when bed available - CIR vs SNF in [place] if family preference.   Annie Main, MSN, RN, ANVP-BC, ANP-BC, [facility] Pager: [phone] 10/03/2012 8:18 AM  I have personally obtained a history, examined the patient, evaluated imaging results, and formulated the assessment and plan of care. I agree with the above.  Delia Heady, MD Medical Director [facility] Pager: [phone] 10/03/2012 8:18 AM

## 2012-10-03 NOTE — Progress Notes (Signed)
Patient: Joe Snow Date of Encounter: 10/03/2012, 8:37 AM Admit date: 09/30/2012     Subjective  Joe Snow has no new complaints. He denies CP or palpitations. He denies dizziness. He reports his SOB has been ongoing for at least 2 months and occurs usually as soon as he wakes up in the morning and occasionally wakes him from sleep abruptly. His SOB is non-exertional.    Objective  Physical Exam: Vitals: BP 133/58  Pulse 68  Temp(Src) 99 F (37.2 C) (Oral)  Resp 18  Ht 5\' 11"  (1.803 m)  Wt 197 lb 8.5 oz (89.6 kg)  BMI 27.56 kg/m2  SpO2 97% General: Well developed, well appearing 79 year old male in no acute distress. Neck: Supple. JVD not elevated. Lungs: Clear bilaterally to auscultation without wheezes, rales, or rhonchi. Breathing is unlabored. Heart: RRR S1 S2 without murmurs, rubs, or gallops.  Abdomen: Soft, non-distended. Extremities: No clubbing or cyanosis. No edema.  Distal pedal pulses are 2+ and equal bilaterally. Neuro: Alert and oriented X 3. Moves all extremities spontaneously. No focal deficits.  Intake/Output:  Intake/Output Summary (Last 24 hours) at 10/03/12 0837 Last data filed at 10/02/12 1200  Gross per 24 hour  Intake 231.25 ml  Output    100 ml  Net 131.25 ml    Inpatient Medications:  . aspirin  325 mg Oral Daily  . feeding supplement  237 mL Oral BID BM  . insulin aspart  0-15 Units Subcutaneous TID WC  . lisinopril  5 mg Oral Daily  . simvastatin  5 mg Oral q1800    Labs:  Recent Labs  10/02/12 0435  NA 141  K 3.9  CL 106  CO2 27  GLUCOSE 172*  BUN 33*  CREATININE 1.61*  CALCIUM 8.9    Recent Labs  10/01/12 0325  HGBA1C 8.0*    Recent Labs  10/01/12 0325  CHOL 125  HDL 51  LDLCALC 65  TRIG 46  CHOLHDL 2.5    Recent Labs  10/02/12 0435  TSH 1.951    Radiology/Studies: Mr Brain Wo Contrast  10/02/2012  *RADIOLOGY REPORT*  Clinical Data:  Ataxia right-sided weakness post t-PA.  Diabetic hypertensive  patient with atrial fibrillation.  MRI BRAIN WITHOUT CONTRAST MRA HEAD WITHOUT CONTRAST  Technique: Multiplanar, multiecho pulse sequences of the brain and surrounding structures were obtained according to standard protocol without intravenous contrast.  Angiographic images of the head were obtained using MRA technique without contrast.  Comparison: None.  MRI HEAD  Findings:  Small acute non hemorrhagic infarcts inferior aspect of the right cerebellum and right posterior lateral aspect of the medulla.  No intracranial hemorrhage.  Prominent small vessel disease type changes.  Global atrophy without hydrocephalus.  No intracranial mass lesion detected on this unenhanced exam.  Abnormal appearance right vertebral artery.  Please see below.  Mucosal thickening paranasal sinuses minimal to mild in degree most notable inferior aspect of the left maxillary sinus.  Mild transverse ligament hypertrophy.  Mild degenerative changes C2- C3 and  C3-4 with mild spinal stenosis and minimal cord flattening.  IMPRESSION:  Small acute non hemorrhagic infarcts inferior aspect of the right cerebellum and right posterior lateral aspect of the medulla.  Please see above.  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  Mild irregularity of the left PICA.  Mild to slightly moderate narrowing and irregularity of the mid to distal basilar artery.  Nonvisualization AICAs.  Moderate narrowing superior cerebellar artery bilaterally.  Mild to moderate narrowing posterior cerebral artery bilaterally greater on the left.  Mild to moderate narrowing the left internal carotid artery petrous/pre cavernous segment junction.  Moderate narrowing cavernous segment right internal carotid artery with mild to moderate narrowing cavernous segment left internal carotid artery.  Moderate narrowing at the junction of the A1 and A2 segment of the left anterior cerebral artery.  Mild to moderate narrowing middle cerebral artery branches  bilaterally.  No aneurysm or vascular malformation detected.  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  Intracranial atherosclerotic type changes otherwise as detailed above.  Preliminary result by Dr. Cherly Hensen called to the nursing staff  of 3100 10/02/2012 5:34 a.m.   Original Report Authenticated By: Lacy Duverney, M.D.    Dg Chest Port 1 View  09/30/2012  *RADIOLOGY REPORT*  Clinical Data: Nausea vomiting.  Shortness of breath.  PORTABLE CHEST - 1 VIEW  Comparison: None.  Findings: 1939 hours. The cardiopericardial silhouette is enlarged. Interstitial markings are diffusely coarsened with chronic features.  No overt airspace pulmonary edema or focal lung consolidation.  No substantial pleural effusion although left costophrenic angle has not been included on the film. Imaged bony structures of the thorax are intact. Telemetry leads overlie the chest.  IMPRESSION: Cardiomegaly with underlying chronic interstitial coarsening.   Original Report Authenticated By: Kennith Center, M.D.     Echocardiogram: Study Conclusions - Left ventricle: The cavity size was normal. Wall thickness was increased in a pattern of mild LVH. Systolic function was normal. The estimated ejection fraction was in the range of 60% to 65%. Wall motion was normal; there were no regional wall motion abnormalities. The study is not technically sufficient to allow evaluation of LV diastolic function. - Aortic valve: Trivial regurgitation. - Mitral valve: Calcified annulus. Mild regurgitation. - Left atrium: The atrium was mildly dilated. - Right atrium: The atrium was mildly dilated. - Pulmonary arteries: PA peak pressure: 34mm Hg (S).  Telemetry: not on telemetry 12-lead ECG from [facility] 09/30/2012 shows atrial fibrillation with SVR at 47 bpm    Assessment and Plan  1. 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 AFib - recently off  anticoagulation/warfarin Dec 2013 due to GI bleed; restart anticoagulation as above 3. Recent GI bleed 4. H/o fatigue and bradycardia on metoprolol - avoid BB 5. Dyspnea - usually occurs upon awakening and is non-exertional; seems atypical for angina/anginal equivalent  Dr. Ladona Ridgel to see Signed, Exie Parody  Cardiology Attending  Agree with above. Lewayne Bunting

## 2012-10-03 NOTE — Clinical Social Work Note (Signed)
Clinical Social Work   CSW met with pt and family to provide bed offers. Pt and family chose [facility]	[place]. CSW confirmed with facility of bed choice and private room availability. Pt and family are agreeable to discharge plan. Bed at [facility] would be available when pt is medically ready. CSW will continue to follow to facilitate discharge to [facility].   Dede Query, MSW, Theresia Majors [phone]

## 2012-10-03 NOTE — Progress Notes (Signed)
Physical Therapy Treatment Patient Details Name: Joe Snow MRN: [id number] DOB: 11-08-1923 Today's Date: 10/03/2012 Time: 4098-1191 PT Time Calculation (min): 31 min  PT Assessment / Plan / Recommendation Comments on Treatment Session  Pt very pleasant & willing to participate.  Cont's to require +2 (A) due to decreased safety & balance.   Pt jokes a lot throughout session which also inhibits safety at times-- pt's daughter present & states this is normal for him.       Follow Up Recommendations  CIR     Does the patient have the potential to tolerate intense rehabilitation     Barriers to Discharge        Equipment Recommendations  Rolling walker with 5" wheels    Recommendations for Other Services Rehab consult  Frequency Min 4X/week   Plan Discharge plan remains appropriate;Frequency remains appropriate    Precautions / Restrictions Precautions Precautions: Fall Precaution Comments: decreased safety awareness Restrictions Weight Bearing Restrictions: No   Pertinent Vitals/Pain 3/4 dyspnea with ambulation.  Daughter reports pt has been experiencing SOB PTA & feels it worsens with anxiety.      Mobility  Bed Mobility Bed Mobility: Supine to Sit;Sitting - Scoot to Edge of Bed Supine to Sit: 5: Supervision;HOB flat Sitting - Scoot to Edge of Bed: 5: Supervision Details for Bed Mobility Assistance: Pt used bil UE's well for transitional movements.  No LOB to Rt noted.   Transfers Transfers: Sit to Stand;Stand to Sit Sit to Stand: 4: Min assist;With upper extremity assist;From toilet;From bed Stand to Sit: 4: Min assist;With upper extremity assist;With armrests;To chair/3-in-1;To toilet Details for Transfer Assistance: Cues for hand placement & technique.  (A) to achieve standing & balance, (A) to position UE's on armrests of recliner with stand>sit, & (A) to control descent.   Ambulation/Gait Ambulation/Gait Assistance: 1: +2 Total assist Ambulation/Gait: Patient  Percentage: 60% Assistive device: Rolling walker Ambulation/Gait Assistance Details: Max cues for safety.  Pt joking around & picking RW up off floor while attempting to take steps.  Cont's to lean heavily to Rt side.  Pt reports he is aware of leaning but unable to correct safely.  Pt impulsive & jerks RW around.   Cues for body positioning inside RW, increase BOS, & overall safety.   Gait Pattern: Step-through pattern;Lateral trunk lean to right;Decreased weight shift to left;Decreased hip/knee flexion - right;Narrow base of support Stairs: No Wheelchair Mobility Wheelchair Mobility: No Modified Rankin (Stroke Patients Only) Pre-Morbid Rankin Score: No symptoms Modified Rankin: Moderately severe disability      PT Goals Acute Rehab PT Goals Time For Goal Achievement: 10/15/12 Potential to Achieve Goals: Good Pt will go Supine/Side to Sit: with modified independence;with HOB 0 degrees PT Goal: Supine/Side to Sit - Progress: Progressing toward goal Pt will go Sit to Supine/Side: with modified independence;with HOB 0 degrees Pt will go Sit to Stand: with supervision PT Goal: Sit to Stand - Progress: Progressing toward goal Pt will go Stand to Sit: with supervision;with upper extremity assist PT Goal: Stand to Sit - Progress: Progressing toward goal Pt will Transfer Bed to Chair/Chair to Bed: with min assist Pt will Ambulate: 16 - 50 feet;with min assist;with least restrictive assistive device PT Goal: Ambulate - Progress: Not progressing Pt will Go Up / Down Stairs: 3-5 stairs;with least restrictive assistive device;with min assist  Visit Information  Last PT Received On: 10/03/12 Assistance Needed: +2    Subjective Data  Subjective: Pt joking again today, but able to accurately report the  events of this AM.     Cognition  Cognition Overall Cognitive Status: Impaired Area of Impairment: Safety/judgement Arousal/Alertness: Awake/alert Orientation Level: Appears intact for tasks  assessed Behavior During Session: [facility] for tasks performed Safety/Judgement: Decreased awareness of safety precautions;Decreased safety judgement for tasks assessed;Impulsive;Decreased awareness of need for assistance Awareness of Deficits: Pt aware of Lt side weakness & leaning to Rt side but very unsafe when attempting to correct.  Pt also joking around & holding RW off floor & attempting to take step.   Cognition - Other Comments: inconsistantly remembering safety cues dispite near constant reinforcement.      Balance  Balance Balance Assessed: Yes Static Standing Balance Static Standing - Balance Support: During functional activity;Left upper extremity supported Static Standing - Level of Assistance: 3: Mod assist Static Standing - Comment/# of Minutes: Pt standing at sink performing ADL requiring (A) for balance due to Rt lateral leaning.  Cues for weight shifting to Lt & increased BOS to promote increased stabiliity.    End of Session PT - End of Session Equipment Utilized During Treatment: Gait belt Activity Tolerance: Patient tolerated treatment well Patient left: in chair;with call bell/phone within reach;with family/visitor present     Verdell Face, PTA [phone] 10/03/2012

## 2012-10-03 NOTE — Progress Notes (Signed)
Occupational Therapy Treatment Patient Details Name: Joe Snow MRN: [id number] DOB: [birth year] Today's Date: 10/03/2012 Time: 1100-1130 OT Time Calculation (min): 30 min  OT Assessment / Plan / Recommendation Comments on Treatment Session Pt demonstrates improvement in R UE control of movement and use of vision as a compensatory strategy.  Requiring more assist for OOB mobility today.  Pt with impulsivity and decreased awareness of safety.    Follow Up Recommendations  CIR    Barriers to Discharge       Equipment Recommendations  3 in 1 bedside comode    Recommendations for Other Services Rehab consult  Frequency Min 3X/week   Plan Discharge plan remains appropriate    Precautions / Restrictions Precautions Precautions: Fall Precaution Comments: decreased safety awareness Restrictions Weight Bearing Restrictions: No   Pertinent Vitals/Pain No pain reported.    ADL  Grooming: Brushing hair;Maximal assistance (assist for balance, used R hand effectively) Where Assessed - Grooming: Supported standing Upper Body Dressing: Minimal assistance Where Assessed - Upper Body Dressing: Unsupported sitting Toilet Transfer: +2 Total assistance Toilet Transfer: Patient Percentage: 60% Toilet Transfer Method: Sit to stand Toilet Transfer Equipment: Comfort height toilet;Grab bars Toileting - Clothing Manipulation and Hygiene: +1 Total assistance Where Assessed - Toileting Clothing Manipulation and Hygiene: Sit to stand from 3-in-1 or toilet Equipment Used: Gait belt;Rolling walker Transfers/Ambulation Related to ADLs: Pt with ataxic gait, leans toward R as his feet move L.  Pt states awareness of leaning, but unable to correct without total assist.  Pt with poor safety awareness, picking up his walker. ADL Comments: Pt using R UE spontaneously as lead to reach for items on tray and for combing his hair.  Less ataxic.    OT Diagnosis:    OT Problem List:   OT Treatment  Interventions:     OT Goals Acute Rehab OT Goals OT Goal Formulation: With patient/family Time For Goal Achievement: 10/15/12 Potential to Achieve Goals: Good ADL Goals Pt Will Perform Grooming: with min assist;Supported;Sitting, chair ADL Goal: Grooming - Progress: Progressing toward goals Pt Will Transfer to Toilet: with max assist;Stand pivot transfer;3-in-1 ADL Goal: Toilet Transfer - Progress: Progressing toward goals Pt Will Perform Toileting - Hygiene: with mod assist;Sit to stand from 3-in-1/toilet Miscellaneous OT Goals Miscellaneous OT Goal #1: Pt will use visual input attending to Rt UE for hand to mouth ADLS OT Goal: Miscellaneous Goal #1 - Progress: Progressing toward goals  Visit Information  Last OT Received On: 10/03/12 Assistance Needed: +2 PT/OT Co-Evaluation/Treatment: Yes    Subjective Data      Prior Functioning       Cognition  Cognition Overall Cognitive Status: Impaired Area of Impairment: Safety/judgement Arousal/Alertness: Awake/alert Orientation Level: Appears intact for tasks assessed Behavior During Session:  (impulsive) Safety/Judgement: Decreased awareness of safety precautions;Decreased safety judgement for tasks assessed;Impulsive;Decreased awareness of need for assistance Awareness of Deficits: Pt aware of Lt side weakness & leaning to Rt side but very unsafe when attempting to correct.  Pt also joking around & holding RW off floor & attempting to take step.   Cognition - Other Comments: inconsistantly remembering safety cues dispite near constant reinforcement.      Mobility  Bed Mobility Bed Mobility: Supine to Sit;Sitting - Scoot to Edge of Bed Supine to Sit: 5: Supervision;HOB flat Sitting - Scoot to Edge of Bed: 5: Supervision Details for Bed Mobility Assistance: Pt used bil UE's well for transitional movements.  No LOB to Rt noted.   Transfers Sit to Stand: 4: Min  assist;With upper extremity assist;From toilet;From bed Stand to  Sit: 4: Min assist;With upper extremity assist;With armrests;To chair/3-in-1;To toilet Details for Transfer Assistance: Cues for hand placement & technique.  (A) to achieve standing & balance, (A) to position UE's on armrests of recliner with stand>sit, & (A) to control descent.      Exercises      Balance Balance Balance Assessed: Yes Static Standing Balance Static Standing - Balance Support: During functional activity;Left upper extremity supported Static Standing - Level of Assistance: 3: Mod assist Static Standing - Comment/# of Minutes: Pt standing at sink performing ADL requiring (A) for balance due to Rt lateral leaning.  Cues for weight shifting to Lt & increased BOS to promote increased stabiliity.     End of Session OT - End of Session Activity Tolerance: Patient limited by fatigue Patient left: in chair;with call bell/phone within reach;with family/visitor present  GO     Evern Bio 10/03/2012, 12:22 PM

## 2012-10-03 NOTE — Progress Notes (Signed)
Rehab admissions - Evaluated for possible admission.  I spoke with patient and I called his wife.  Wife would like [facility] in [place] for rehab.  It is 1.5 minutes from her house.  She states that she does not like [place] and does not want to drive so far daily to see her husband.  I have let the social worker and case manager know wife's preferences.  Call me for questions.  #[phone]

## 2012-10-03 NOTE — Progress Notes (Signed)
Pharmacy note Re: Xarelto Indication: atrial fibrillation/ secondary stroke prevention  Ht: 5" 11" Wt: 89.6 kg  09/30/12 at [facility]:   BUN 26/creatinine 1.65.   H/H 12.1/38.6, platelet count 171K.  At [facility]:   BUN 33/creatinine 1.61 on 3/5.   No CBC yet.  Assessment: Xarelto 15 mg PO daily begun today. Appropriate for crcl 15-50 ml/min. Noted hx diverticulitis/GI bleed in December 2013 while on Coumadin.  Patient prefers newer oral agent.  Plan: Continue Xarelto 15 mg PO daily with supper. Consider rechecking bmet and CBC within the next few days.  Nicolette Bang, RPh Pager: [phone] 10/03/2012. 3:43 PM

## 2012-10-04 LAB — GLUCOSE, CAPILLARY: Glucose-Capillary: 165 mg/dL — ABNORMAL HIGH (ref 70–99)

## 2012-10-04 MED ORDER — RIVAROXABAN 15 MG PO TABS: 15.0000 mg | ORAL_TABLET | Freq: Every day | ORAL | Status: DC

## 2012-10-04 MED ORDER — LISINOPRIL 5 MG PO TABS: 5.0000 mg | ORAL_TABLET | Freq: Every day | ORAL | Status: DC

## 2012-10-04 MED ORDER — GLUCERNA SHAKE PO LIQD: 237.0000 mL | Freq: Two times a day (BID) | ORAL | Status: AC

## 2012-10-04 NOTE — Progress Notes (Addendum)
Speech Language Pathology Treatment Patient Details Name: Joe Snow MRN: [id number] DOB: 27-Sep-1923 Today's Date: 10/04/2012 Time: 4098-1191 SLP Time Calculation (min): 26 min  Assessment / Plan / Recommendation Clinical Impression  Pt seen for skilled cog-ling tx including education YN:[id number] strategies for pt reported word finding deficits.   Tasks to improve word finding and attention skills reviewed verbally and provided in writing for pt's family (as pt can not see adequately to read).  Pt's hearing loss is a barrier and may mimic attention difficulties and impair communication.    Today attended to therapy task without distraction for approx 25 minutes without cues.  He also recalled information shared with him from beginning of session without cues.  Pt denied memory changes with current event but admits to baseline word finding difficulties that have not worsened significantly with this event.    Pt has met all goals and demonstrated significant improvement since admission.  If pt is observed to have functional cog-ling deficits during PT/OT at SNF, would recommend SLP be reconsulted to maximize pt's function at SNF- otherwise no further SLP indicated.  All educated completed and pt agreeable to discharging from SLP.      SLP Plan  Discharge SLP treatment due to (comment);All goals met    Pertinent Vitals/Pain Afebrile, decreased  SLP Goals  SLP Goals SLP Goal #3 - Progress: Met SLP Goal #4 - Progress: Met  General Temperature Spikes Noted: No Respiratory Status: Room air Behavior/Cognition: Alert;Cooperative;Pleasant mood;Hard of hearing (HOH!) Oral Cavity - Dentition: Adequate natural dentition   Treatment Treatment focused on: Cognition;Patient/family/caregiver education Family/Caregiver Educated: pt Skilled Treatment: education, verbal cues, written cues and written tasks for future use   GO     Donavan Burnet, MS [facility] SLP [phone]
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
## 2012-10-04 NOTE — Progress Notes (Addendum)
Physical Therapy Treatment Patient Details Name: Joe Snow MRN: [id number] DOB: 1923-09-11 Today's Date: 10/04/2012 Time: 5621-3086 PT Time Calculation (min): 23 min  PT Assessment / Plan / Recommendation Comments on Treatment Session  Pt continues to have a Rt lateral lean during ambulation.  Pt has difficulty wt shifting to Lt side during standing.  With multiple repetitions of Sit<>Stand pt able to achieve proper technique.    Follow Up Recommendations  CIR     Does the patient have the potential to tolerate intense rehabilitation     Barriers to Discharge        Equipment Recommendations  Rolling walker with 5" wheels    Recommendations for Other Services Rehab consult  Frequency Min 4X/week   Plan Discharge plan remains appropriate;Frequency remains appropriate    Precautions / Restrictions Precautions Precautions: Fall Precaution Comments: decreased safety awareness Restrictions Weight Bearing Restrictions: No   Pertinent Vitals/Pain Pt indicated he was feeling pretty good today.    Mobility  Bed Mobility Bed Mobility: Sitting - Scoot to Edge of Bed Sitting - Scoot to Edge of Bed: 5: Supervision Details for Bed Mobility Assistance: Pt uses B UE to assist.  No LOB observed. Transfers Transfers: Sit to Stand;Stand to Sit Sit to Stand: 4: Min assist;With upper extremity assist;From chair/3-in-1;From bed;4: Min guard Stand to Sit: 4: Min assist;4: Min guard;With upper extremity assist;With armrests;To bed;To chair/3-in-1 Details for Transfer Assistance: Sit<>Stand 7x's Mod (A) for initial Sit<>Stand progressing to Min (A) and to [employer] for last 2x's.  Cues for hand placement initially.  Progressed to self verbalization. Ambulation/Gait Ambulation/Gait Assistance: 1: +2 Total assist Ambulation/Gait: Patient Percentage: 70% Ambulation Distance (Feet): 180 Feet Assistive device: Rolling walker Ambulation/Gait Assistance Details: Two person assist for safety for  lateral weight shift while walking.  Pt leans to right.  Pt needs (A) keeping RW straight and on ground.   Gait Pattern: Step-through pattern;Lateral trunk lean to right;Decreased weight shift to left Stairs: No Wheelchair Mobility Wheelchair Mobility: No    Exercises     PT Diagnosis:    PT Problem List:   PT Treatment Interventions:     PT Goals Acute Rehab PT Goals Time For Goal Achievement: 10/15/12 Potential to Achieve Goals: Good Pt will go Supine/Side to Sit: with modified independence;with HOB 0 degrees Pt will go Sit to Supine/Side: with modified independence;with HOB 0 degrees Pt will go Sit to Stand: with supervision PT Goal: Sit to Stand - Progress: Progressing toward goal Pt will go Stand to Sit: with supervision;with upper extremity assist PT Goal: Stand to Sit - Progress: Progressing toward goal Pt will Transfer Bed to Chair/Chair to Bed: with min assist Pt will Ambulate: 16 - 50 feet;with min assist;with least restrictive assistive device PT Goal: Ambulate - Progress: Progressing toward goal Pt will Go Up / Down Stairs: 3-5 stairs;with least restrictive assistive device;with min assist  Visit Information  Last PT Received On: 10/04/12 Assistance Needed: +2    Subjective Data  Subjective: Pt reports he is feeling pretty good.   Cognition  Cognition Overall Cognitive Status: Impaired Area of Impairment: Safety/judgement Arousal/Alertness: Awake/alert Orientation Level: Appears intact for tasks assessed Behavior During Session: [facility] for tasks performed Safety/Judgement: Decreased awareness of safety precautions;Decreased safety judgement for tasks assessed;Impulsive;Decreased awareness of need for assistance Awareness of Deficits: Pt aware of Lt side weakness, pt unable to correct without tactile and verbal cues.    Balance     End of Session PT - End of Session Equipment  Utilized During Treatment: Gait belt Activity Tolerance: Patient tolerated treatment  well Patient left: in chair;with call bell/phone within reach   GP     Enid Baas, SPTA 10/04/2012, 2:43 PM
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
## 2012-10-05 ENCOUNTER — Encounter (HOSPITAL_COMMUNITY): Payer: Self-pay | Admitting: Internal Medicine

## 2012-10-05 ENCOUNTER — Inpatient Hospital Stay (HOSPITAL_COMMUNITY): Payer: Medicare Other

## 2012-10-05 ENCOUNTER — Encounter (HOSPITAL_COMMUNITY)
Admission: AD | Disposition: A | Discharge status: Rehabilitation Facility | Payer: Self-pay | Source: Other Acute Inpatient Hospital | Attending: Neurology

## 2012-10-05 DIAGNOSIS — T18108A Unspecified foreign body in esophagus causing other injury, initial encounter: Secondary | ICD-10-CM

## 2012-10-05 DIAGNOSIS — B3781 Candidal esophagitis: Secondary | ICD-10-CM

## 2012-10-05 DIAGNOSIS — R1313 Dysphagia, pharyngeal phase: Secondary | ICD-10-CM

## 2012-10-05 HISTORY — PX: ESOPHAGOGASTRODUODENOSCOPY: SHX5428

## 2012-10-05 HISTORY — DX: Dysphagia, pharyngeal phase: R13.13

## 2012-10-05 LAB — CBC
HCT: 40.6 % (ref 39.0–52.0)
Hemoglobin: 13 g/dL (ref 13.0–17.0)
MCH: 27.5 pg (ref 26.0–34.0)
MCHC: 32 g/dL (ref 30.0–36.0)
MCV: 86 fL (ref 78.0–100.0)
Platelets: 186 10*3/uL (ref 150–400)
RBC: 4.72 MIL/uL (ref 4.22–5.81)
RDW: 14 % (ref 11.5–15.5)
WBC: 9.9 10*3/uL (ref 4.0–10.5)

## 2012-10-05 LAB — GLUCOSE, CAPILLARY
Glucose-Capillary: 154 mg/dL — ABNORMAL HIGH (ref 70–99)
Glucose-Capillary: 160 mg/dL — ABNORMAL HIGH (ref 70–99)
Glucose-Capillary: 152 mg/dL — ABNORMAL HIGH (ref 70–99)

## 2012-10-05 LAB — BASIC METABOLIC PANEL
BUN: 17 mg/dL (ref 6–23)
Calcium: 9.8 mg/dL (ref 8.4–10.5)
Creatinine, Ser: 1.26 mg/dL (ref 0.50–1.35)
GFR calc Af Amer: 58 mL/min — ABNORMAL LOW (ref >90)
GFR calc non Af Amer: 50 mL/min — ABNORMAL LOW (ref >90)

## 2012-10-05 SURGERY — EGD (ESOPHAGOGASTRODUODENOSCOPY)
Anesthesia: Moderate Sedation

## 2012-10-05 MED ORDER — FENTANYL CITRATE 0.05 MG/ML IJ SOLN
INTRAMUSCULAR | Status: DC | PRN
  Administered 2012-10-05: 25 ug via INTRAVENOUS

## 2012-10-05 MED ORDER — FENTANYL CITRATE 0.05 MG/ML IJ SOLN
INTRAMUSCULAR | Status: AC
  Filled 2012-10-05: qty 2

## 2012-10-05 MED ORDER — FLUCONAZOLE 100MG IVPB
50.0000 mg | INTRAVENOUS | Status: DC
  Administered 2012-10-06 – 2012-10-08 (×3): 50 mg via INTRAVENOUS
  Filled 2012-10-05 (×4): qty 25

## 2012-10-05 MED ORDER — SODIUM CHLORIDE 0.9 % IV SOLN: INTRAVENOUS | Status: DC

## 2012-10-05 MED ORDER — MIDAZOLAM HCL 10 MG/2ML IJ SOLN
INTRAMUSCULAR | Status: DC | PRN
  Administered 2012-10-05: 1 mg via INTRAVENOUS

## 2012-10-05 MED ORDER — HYDRALAZINE HCL 20 MG/ML IJ SOLN
5.0000 mg | INTRAMUSCULAR | Status: DC | PRN
  Administered 2012-10-05 – 2012-10-08 (×7): 5 mg via INTRAVENOUS
  Filled 2012-10-05 (×8): qty 0.25

## 2012-10-05 MED ORDER — MIDAZOLAM HCL 5 MG/ML IJ SOLN
INTRAMUSCULAR | Status: AC
  Filled 2012-10-05: qty 2

## 2012-10-05 MED ORDER — BUTAMBEN-TETRACAINE-BENZOCAINE 2-2-14 % EX AERO
INHALATION_SPRAY | CUTANEOUS | Status: DC | PRN
  Administered 2012-10-05: 2 via TOPICAL

## 2012-10-05 MED ORDER — FLUCONAZOLE 100MG IVPB
100.0000 mg | Freq: Once | INTRAVENOUS | Status: AC
  Administered 2012-10-05: 100 mg via INTRAVENOUS
  Filled 2012-10-05: qty 50

## 2012-10-05 MED ORDER — LISINOPRIL 5 MG PO TABS
5.0000 mg | ORAL_TABLET | Freq: Once | ORAL | Status: AC
  Administered 2012-10-05: 5 mg via ORAL
  Filled 2012-10-05: qty 1

## 2012-10-05 MED ORDER — SODIUM CHLORIDE 0.45 % IV SOLN
INTRAVENOUS | Status: DC
  Administered 2012-10-05 – 2012-10-08 (×5): via INTRAVENOUS

## 2012-10-05 MED ORDER — HYDRALAZINE HCL 20 MG/ML IJ SOLN
5.0000 mg | Freq: Once | INTRAMUSCULAR | Status: AC
  Administered 2012-10-05: 5 mg via INTRAVENOUS
  Filled 2012-10-05: qty 0.25
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
## 2012-10-05 NOTE — Progress Notes (Signed)
Pharmacy asked to dose fluconazole IV for esophageal candidasis.   Scr 1.3 Est CrCl ~ 44 ml/min.  Plan: Fluconazole 100 mg x 1 then 50 mg daily. F/u LOT.  Tad Moore, BCPS  Clinical Pharmacist Pager [phone]  10/05/2012 6:56 PM

## 2012-10-05 NOTE — Progress Notes (Signed)
Dr. Amada Jupiter called for patient's increased BP. One time order for 5mg  hydralazine received. Given to patient. BP decreased to 161/76. Will continue to monitor.

## 2012-10-05 NOTE — Consult Note (Signed)
[facility] Consultation  Requesting Provider: Stroke Team Primary Care Physician:  [facility], MD Primary Gastroenterologist:  ?    Reason for Consultation:  Esophageal obstruction/food impaction   Assessment:  Clinical scenario compatible with esophageal food impaction     Recommendations: EGD and removal of food impaction     HPI: Joe Snow is a 79 y.o. male admitted after a stroke, treated with tPA. He had no major swallowing problems until last night during after supper. Tried to eat breakfast and choked on and ? Aspirated some eggs. Unable to swallow saliva. He had attempt at [facility] today and applesauce would not go into the esophagus. i spoke to Dr. Rito Ehrlich of radiology - he suspects food impaction. Patient unable to swallow water. He has a foreign body senasation in suprasternal notch and also has been hoarse since supper yesterday.   Past Medical History  Diagnosis Date  . Hypertension associated with diabetes   . Diabetes   . Atrial fibrillation   Stroke  Past Surgical History  Procedure Laterality Date  . Prostate surgery N/A 1995    estimate  . Tonsillectomy Bilateral 1930s    Prior to Admission medications   Medication Sig Start Date End Date Taking? Authorizing Provider  feeding supplement (GLUCERNA SHAKE) LIQD Take 237 mLs by mouth 2 (two) times daily between meals. 10/04/12   Layne Benton, NP  insulin detemir (LEVEMIR) 100 UNIT/ML injection Inject 20-25 Units into the skin 2 (two) times daily.   Yes Historical Provider, MD  levothyroxine (SYNTHROID, LEVOTHROID) 88 MCG tablet Take 88 mcg by mouth daily.   Yes Historical Provider, MD  lisinopril (PRINIVIL,ZESTRIL) 5 MG tablet Take 1 tablet (5 mg total) by mouth daily. 10/04/12   Layne Benton, NP  lovastatin (MEVACOR) 10 MG tablet Take 10 mg by mouth daily.   Yes Historical Provider, MD  montelukast (SINGULAIR) 10 MG tablet Take 10 mg by mouth daily.   Yes Historical Provider, MD  pioglitazone  (ACTOS) 30 MG tablet Take 30 mg by mouth daily.   Yes Historical Provider, MD  prednisoLONE acetate (PRED FORTE) 1 % ophthalmic suspension Place 1 drop into the left eye daily as needed (flares).   Yes Historical Provider, MD  Rivaroxaban (XARELTO) 15 MG TABS tablet Take 1 tablet (15 mg total) by mouth daily with supper. 10/04/12   Layne Benton, NP  sertraline (ZOLOFT) 25 MG tablet Take 25 mg by mouth daily.   Yes Historical Provider, MD  trifluridine (VIROPTIC) 1 % ophthalmic solution Place 1 drop into the left eye daily as needed (flares).   Yes Historical Provider, MD    Current Facility-Administered Medications  Medication Dose Route Frequency Provider Last Rate Last Dose  . bisacodyl (DULCOLAX) EC tablet 5 mg  5 mg Oral Daily PRN Layne Benton, NP   5 mg at 10/04/12 1849  . calcium carbonate (TUMS - dosed in mg elemental calcium) chewable tablet 200-400 mg of elemental calcium  1-2 tablet Oral BID BM & HS PRN Ritta Slot, MD   200 mg of elemental calcium at 10/05/12 0505  . feeding supplement (GLUCERNA SHAKE) liquid 237 mL  237 mL Oral BID BM Heather Cornelison Pitts, RD   237 mL at 10/03/12 1408  . hydrALAZINE (APRESOLINE) injection 5 mg  5 mg Intravenous Q4H PRN David L Rinehuls, PA-C      . insulin aspart (novoLOG) injection 0-15 Units  0-15 Units Subcutaneous TID WC Thana Farr, MD   3 Units at 10/04/12 1720  .  lisinopril (PRINIVIL,ZESTRIL) tablet 5 mg  5 mg Oral Daily Jessica A Hope, PA-C   5 mg at 10/04/12 0945  . ondansetron (ZOFRAN) injection 4 mg  4 mg Intravenous Q6H PRN Thana Farr, MD   4 mg at 10/03/12 0981  . Rivaroxaban (XARELTO) tablet TABS 15 mg  15 mg Oral Q supper Layne Benton, NP   15 mg at 10/04/12 1720  . senna-docusate (Senokot-S) tablet 1 tablet  1 tablet Oral QHS PRN [name], MD      . simvastatin (ZOCOR) tablet 5 mg  5 mg Oral q1800 Jessica A Hope, PA-C   5 mg at 10/04/12 1720    Allergies as of 09/30/2012  . (Not on File)    History  reviewed. No pertinent family history.  History   Social History  . Marital Status: Married    Spouse Name: Jeanie Helbert    Number of Children: 3  . Years of Education: college   Occupational History  . Not on file.   Social History Main Topics  . Smoking status: Former Smoker    Types: Cigarettes    Quit date: 07/31/1978  . Smokeless tobacco: Never Used  . Alcohol Use: 1.0 oz/week    2 drink(s) per week  . Drug Use: No     Review of Systems: Positive for some diffuse weakness, recent stroke with focal weakness All other review of systems negative except as mentioned in the HPI.  Physical Exam: Vital signs in last 24 hours: Temp:  [97.2 F (36.2 C)-98.1 F (36.7 C)] 98 F (36.7 C) (03/08 1419) Pulse Rate:  [71-83] 73 (03/08 1419) Resp:  [18-20] 20 (03/08 0611) BP: (160-197)/(66-102) 190/102 mmHg (03/08 1419) SpO2:  [95 %-100 %] 98 % (03/08 1419) Last BM Date: 10/04/12 General:   Alert,  Well-developed, well-nourished, pleasant and cooperative in NAD Eyes:  Sclera clear, no icterus.    Mouth:  No deformity or lesions.  Oropharynx pink & moist. Neck:  Supple; no masses or thyromegaly. Lungs:  Clear throughout to auscultation.   Heart:  Regular rate and rhythm; no murmurs, clicks, rubs,  or gallops. Abdomen:  Soft, nontender and nondistended.     Neurologic:  Alert and  oriented x 3 Lymph Nodes:  No significant cervical or supraclavicular adenopathy. Psych:  Alert and cooperative. Normal mood and affect.  Intake/Output from previous day: 03/07 0701 - 03/08 0700 In: 240 [P.O.:240] Out: 660 [Urine:660]   Lab Results:  Recent Labs  10/05/12 1550  WBC 9.9  HGB 13.0  HCT 40.6  PLT 186   Studies/Results: Dg Chest Port 1 View  10/05/2012  *RADIOLOGY REPORT*  Clinical Data: Dyspnea.  PORTABLE CHEST - 1 VIEW  Comparison: Chest x-ray 09/30/2012.  Findings: Lung volumes are low.  Minimal blunting of the right costophrenic sulcus may suggest a trace right-sided  pleural effusion.  No definite consolidative airspace disease, however, there is some mild interstitial prominence in the right base. Pulmonary vasculature is within normal limits.  Heart size is upper limits of normal.  Atherosclerosis in the thoracic aorta.  IMPRESSION: 1.  Interstitial prominence in the right base could suggest sequelae of recent aspiration with some pneumonitis in the right lower lobe.  In addition, there is a trace right pleural effusion. 2.  Borderline cardiomegaly. 3.  Atherosclerosis.   Original Report Authenticated By: Trudie Reed, M.D.     LOS: 5 days      @Carl  E. Gessner, MD, [facility] [phone] (pager) 10/05/2012 4:36  PM@

## 2012-10-05 NOTE — Op Note (Signed)
[name] [address] [place], [address]   ENDOSCOPY PROCEDURE REPORT  PATIENT: Joe Snow, Joe Snow  MR#: [id number] BIRTHDATE: June 15, 1924 , 88  yrs. old GENDER: Male ENDOSCOPIST: Iva Boop, MD, [facility] REFERRED BY:   Stroke team PROCEDURE DATE:  10/05/2012 PROCEDURE:  EGD, diagnostic ASA CLASS:     Class III INDICATIONS:  Dysphagia.  ? food impaction MEDICATIONS: Fentanyl 25 mcg IV and Versed 1mg  IV TOPICAL ANESTHETIC: Cetacaine Spray  DESCRIPTION OF PROCEDURE: After the risks benefits and alternatives of the procedure were thoroughly explained, informed consent was obtained.  The Pentax Gastroscope [id number] endoscope was introduced through the mouth and advanced to the second portion of the duodenum. Without limitations.  The instrument was slowly withdrawn as the mucosa was fully examined.        PHARYNX: One of the arytenoids was less mobile and often opposed the upper esophageal sphincter area ESOPHAGUS: White exudates consistent with candidiasis were found in the entire esophagus.   There was a dilated/prominent vein in proximal esophagus.  The remainder of the upper endoscopy exam was otherwise normal. Retroflexed views revealed no abnormalities.     The scope was then withdrawn from the patient and the procedure completed.  COMPLICATIONS: There were no complications. ENDOSCOPIC IMPRESSION: 1.   White exudates consistent with candidiasis in the esophagus 2.   Suspect abnormal motility of arytenoid/hypopharyngeal area 3.   Prominent vein in proximal esophagus - suspect inconsequential 4.   The remainder of the upper endoscopy exam was otherwise normal  RECOMMENDATIONS: Fluconazole NPO until speechpathology reassessment Further plans pending that - I think the prominent vein is incidental but if still unable to explain or see improvement would likely image the neck/chest with CT    eSigned:  Iva Boop, MD, [facility] 10/05/2012  6:15 PM

## 2012-10-05 NOTE — Evaluation (Addendum)
Clinical/Bedside Swallow Evaluation Patient Details  Name: Joe Snow MRN: [id number] Date of Birth: 11-17-1923  Today's Date: 10/05/2012 Time: 1400-1430 SLP Time Calculation (min): 30 min  Past Medical History:  Past Medical History  Diagnosis Date  . Hypertension associated with diabetes   . Diabetes   . Atrial fibrillation    Past Surgical History:  Past Surgical History  Procedure Laterality Date  . Prostate surgery N/A 1995    estimate  . Tonsillectomy Bilateral 1930s   HPI:  79 y/o male admitted to ED wth R arm and facial weakness. S/p IV t-PA, 09/30/12 at [facility] and transferred to [facility].  Imaging confirms interior right cerebellum and right posterior lateral medullary infarct due to occuluded right vetebral artery.  Patient seen for Cognitive Linguistic evlauation on 10/02/12 with recommended ST treatment to address expressive language deficits.  Patient with difficulty swallowing on 10/04/12.  Per nursing report patient with choking incident during am meal requiring oral suction to clear.  BSE indicated to assess risk for aspiration.  Patient's family members present and report patient has a history of "strangling" with thin liquids.     Assessment / Plan / Recommendation Clinical Impression    Suspected oropharyngeal dysphagia due to possible obstruction with s/s of aspiration s/p swallow of limited PO trials. Patient with wet vocal quality with expectoration of thin liquids and puree trials immediately after the swallow with increased WOB.  Patient reports globus sensation.  No solids attempted secondary to severity of dysphagia. Recommend to proceed with objective assessment of MBS to assess risk of aspiration and provide information for POC.     Aspiration Risk  Severe    Diet Recommendation NPO   Medication Administration: Via alternative means    Other  Recommendations Recommended Consults: MBS Oral Care Recommendations: Oral care QID   Follow Up  Recommendations  Skilled Nursing facility    Frequency and Duration min 2x/week  2 weeks       SLP Swallow Goals  Pending results of MBS   Swallow Study Prior Functional Status   Home with spouse with history of coughing with liquids    General Date of Onset: 10/05/12 HPI: 79 y/o male admitted to ED wth R arm and facial weakness. S/p IV t-PA, 09/30/12 at [facility] and transferred to [facility].  Imaging confirms interior right cerebellum and right posterior lateral medullary infarct due to occuluded right vetebral artery.  Patient seen for Cognitive Linguistic evlauation on 10/02/12 with recommended ST treatment to address expressive language deficits.  Patient with difficulty swallowing on 10/04/12.  Per nursing report patient with choking incident during am meal requiring oral suction to clear.  BSE indicated to assess risk for aspiration.  Patient's family members present and report patient has a history of "strangling" with thin liquids.   Type of Study: Bedside swallow evaluation Diet Prior to this Study: NPO Respiratory Status: Supplemental O2 delivered via (comment) (nasal cannula at 2 L) History of Recent Intubation: No Behavior/Cognition: Alert;Cooperative;Pleasant mood Oral Cavity - Dentition: Adequate natural dentition Self-Feeding Abilities: Needs assist Patient Positioning: Upright in bed Baseline Vocal Quality: Wet;Breathy;Hoarse Volitional Cough: Weak;Wet Volitional Swallow: Able to elicit    Oral/Motor/Sensory Function Overall Oral Motor/Sensory Function: Appears within functional limits for tasks assessed   Ice Chips Ice chips: Impaired Pharyngeal Phase Impairments: Suspected delayed Swallow;Decreased hyoid-laryngeal movement;Wet Vocal Quality;Throat Clearing - Immediate;Cough - Immediate;Other (comments) (SOB)   Thin Liquid Thin Liquid: Impaired Presentation: Cup;Spoon Pharyngeal  Phase Impairments: Suspected delayed Swallow;Decreased hyoid-laryngeal movement;Cough -  Immediate;Throat Clearing - Immediate;Wet Vocal Quality;Multiple swallows Other Comments: Patient with head back posture prior to swallow stating he was attempting to get water down completly     Nectar Thick Nectar Thick Liquid: Not tested   Honey Thick Honey Thick Liquid: Not tested   Puree Puree: Impaired Presentation: Spoon Pharyngeal Phase Impairments: Suspected delayed Swallow;Decreased hyoid-laryngeal movement;Multiple swallows;Wet Vocal Quality;Throat Clearing - Immediate;Cough - Immediate   Solid   GO    Solid: Not tested      Moreen Fowler MS, CCC-SLP [phone] [facility] 10/05/2012,3:16 PM

## 2012-10-05 NOTE — Progress Notes (Signed)
Night RN and day RN rounded on pt. Pt complaining not able to swallow his eggs and was having trouble swallowing in general. Meal tray removed and MD notified. Will make NPO and order ST.

## 2012-10-05 NOTE — Progress Notes (Signed)
Dr Petra Kuba notified of elevated BP 200/90 & increased dysphagia.  New orders received and initiated.

## 2012-10-05 NOTE — Progress Notes (Signed)
Dr. Leone Payor has taken the patient to the endoscopy suite to attempt removal of the food impaction. The patient remains n.p.o. His blood pressure is being controlled with intravenous hydralazine. Will ask nursing to do a bedside swallow later this evening prior to taking Xarelto. Continue IV fluids in the interim. Will not feet the patient until swallowing can be cleared by speech therapy tomorrow unless Dr. Leone Payor feels strongly otherwise.  Delton See PA-C [facility] Pager [phone] 10/05/2012, 5:34 PM

## 2012-10-05 NOTE — Procedures (Signed)
Objective Swallowing Evaluation: Modified Barium Swallowing Study  Patient Details  Name: Joe Snow MRN: [id number] Date of Birth: 28-May-1924  Today's Date: 10/05/2012 Time: 1500-1530 SLP Time Calculation (min): 30 min  Past Medical History:  Past Medical History  Diagnosis Date  . Hypertension associated with diabetes   . Diabetes   . Atrial fibrillation   . Embolic stroke involving left vertebral artery 10/02/2012   Past Surgical History:  Past Surgical History  Procedure Laterality Date  . Prostate surgery N/A 1995    estimate  . Tonsillectomy Bilateral 1930s   HPI:  79 y/o male referred for MBS following results of BSE.  Patient with choking incident during am meal requiring suctioning to clear.       Assessment / Plan / Recommendation Clinical Impression  Dysphagia Diagnosis: Severe pharyngeal phase dysphagia Clinical impression: Pharyngeal phase dysphagia secondary to what appeared to be obstruction at level of cervical esophagus. Severe pooling and retainment of all consistencies in pyriforms with aspiration during and after the swallow due to residuals.  Radioligist, Rito Ehrlich confirmed presence of suspected obstruction.  Study discontinued and RN notified with recommendations of ENT vs. GI consult.  Recommend to reassess swallow bedside following results of consultation.      Treatment Recommendation   Continued Skilled ST  In acute care setting.    Diet Recommendation NPO   Medication Administration: Via alternative means    Other  Recommendations Recommended Consults: Consider ENT evaluation;Consider GI evaluation Oral Care Recommendations: Oral care QID   Follow Up Recommendations  Skilled Nursing facility    Frequency and Duration min 2x/week  2 weeks       SLP Swallow Goals Goal #3: Patient will consume diagnostic PO trials of various consistencies w/o s/s of aspiratioin to assess readiness to resume PO's   General Date of Onset: 10/05/12 HPI: 79  y/o male referred for MBS following results of BSE.  Patient with choking incident during am meal requiring suctioning toclear.   Type of Study: Modified Barium Swallowing Study Reason for Referral: Objectively evaluate swallowing function Diet Prior to this Study: NPO Respiratory Status: Supplemental O2 delivered via (comment) History of Recent Intubation: No Behavior/Cognition: Alert;Cooperative Oral Cavity - Dentition: Adequate natural dentition Oral Motor / Sensory Function: Impaired motor Oral impairment: Left facial;Left labial Self-Feeding Abilities: Able to feed self Patient Positioning: Upright in chair Baseline Vocal Quality: Breathy;Hoarse;Low vocal intensity Volitional Cough: Wet;Weak;Congested Volitional Swallow: Able to elicit Anatomy: Within functional limits Pharyngeal Secretions: Not observed secondary MBS    Reason for Referral Objectively evaluate swallowing function   Oral Phase Oral Preparation/Oral Phase Oral Phase: WFL   Pharyngeal Phase Pharyngeal Phase Pharyngeal Phase: Impaired Pharyngeal - Thin Pharyngeal - Thin Teaspoon: Premature spillage to valleculae;Reduced pharyngeal peristalsis;Reduced epiglottic inversion;Reduced laryngeal elevation;Reduced airway/laryngeal closure;Penetration/Aspiration during swallow;Trace aspiration;Pharyngeal residue - pyriform sinuses;Pharyngeal residue - posterior pharnyx;Pharyngeal residue - cp segment Penetration/Aspiration details (thin teaspoon): Material enters airway, passes BELOW cords and not ejected out despite cough attempt by patient Pharyngeal - Thin Cup: Premature spillage to valleculae;Reduced pharyngeal peristalsis;Reduced epiglottic inversion;Reduced anterior laryngeal mobility;Reduced laryngeal elevation;Reduced airway/laryngeal closure;Reduced tongue base retraction;Penetration/Aspiration after swallow;Trace aspiration;Pharyngeal residue - pyriform sinuses;Pharyngeal residue - posterior pharnyx;Pharyngeal residue -  cp segment Penetration/Aspiration details (thin cup): Material enters airway, passes BELOW cords and not ejected out despite cough attempt by patient Pharyngeal - Solids Pharyngeal - Puree: Reduced pharyngeal peristalsis;Reduced epiglottic inversion;Reduced anterior laryngeal mobility;Reduced laryngeal elevation;Reduced airway/laryngeal closure;Penetration/Aspiration after swallow;Pharyngeal residue - cp segment;Pharyngeal residue - pyriform sinuses;Pharyngeal residue - posterior pharnyx  Cervical Esophageal Phase    GO   Joe Fowler MS, CCC-SLP [phone] Cervical Esophageal Phase Cervical Esophageal Phase: Impaired Cervical Esophageal Phase - Solids Puree: Reduced cricopharyngeal relaxation         [facility] 10/05/2012, 6:12 PM

## 2012-10-06 ENCOUNTER — Inpatient Hospital Stay (HOSPITAL_COMMUNITY): Payer: Medicare Other

## 2012-10-06 LAB — CBC WITH DIFFERENTIAL/PLATELET
Basophils Absolute: 0 10*3/uL (ref 0.0–0.1)
Eosinophils Absolute: 0.2 10*3/uL (ref 0.0–0.7)
Eosinophils Relative: 2 % (ref 0–5)
HCT: 40.3 % (ref 39.0–52.0)
Lymphocytes Relative: 18 % (ref 12–46)
MCH: 26.9 pg (ref 26.0–34.0)
MCV: 86.1 fL (ref 78.0–100.0)
Monocytes Absolute: 1.4 10*3/uL — ABNORMAL HIGH (ref 0.1–1.0)
RDW: 14.5 % (ref 11.5–15.5)
WBC: 9.5 10*3/uL (ref 4.0–10.5)

## 2012-10-06 LAB — GLUCOSE, CAPILLARY
Glucose-Capillary: 141 mg/dL — ABNORMAL HIGH (ref 70–99)
Glucose-Capillary: 152 mg/dL — ABNORMAL HIGH (ref 70–99)
Glucose-Capillary: 138 mg/dL — ABNORMAL HIGH (ref 70–99)

## 2012-10-06 MED ORDER — HYDRALAZINE HCL 20 MG/ML IJ SOLN
5.0000 mg | Freq: Once | INTRAMUSCULAR | Status: AC
  Administered 2012-10-06: 5 mg via INTRAVENOUS
  Filled 2012-10-06: qty 0.25

## 2012-10-06 NOTE — Progress Notes (Signed)
Speech Language Pathology Dysphagia Treatment Patient Details Name: Joe Snow MRN: [id number] DOB: [birth year] Today's Date: 10/06/2012 Time: 1120-1200 SLP Time Calculation (min): 40 min  Assessment / Plan / Recommendation Clinical Impression  Focus of treatment to reassess swallow for PO readiness following GI consult.  Patient seen by GI due to question of food impaction as suspected from results of MBS.  Results of endoscopy indicates the following: suspected reduced arytenoid motility, evidence of candidiasis in esophagus and prominent vein in proximal esophagus but otherwise exam was normal.  Diagnostic treatment completed this date included direct observation of Patient with ice chips , thin water by cup, and puree consistency.  Patient continues with s/s of penetration to cords vs. aspiration.  Multiple swallows required for each sip/bite with immediate throat clears, wet vocal quality and expectoration of bolus requiring oral suction.  Patient continues to report globus sensation and sensation of residuals s/p swallow of puree consistent with results of MBS.  Patient with signs of discomfort during the swallow.  Recommend the safest is continued NPO with exception of ice chips PRN s/p oral care.  Recommend to proceed with objective assessment of FEE's to assess secretion management, view vocal cord function and assess risk for aspiration .  FEE's to be completed on 10/07/12.     Diet Recommendation  Continue with Current Diet: NPO;Other (comment) (exception of Ice chips PRN)    SLP Plan FEES      Swallowing Goals  SLP Swallowing Goals Swallow Study Goal #3 - Progress: Not met  General Temperature Spikes Noted: No Respiratory Status: Room air Behavior/Cognition: Alert;Cooperative;Pleasant mood Oral Cavity - Dentition: Adequate natural dentition Patient Positioning: Upright in bed  Oral Cavity - Oral Hygiene Does patient have any of the following "at risk" factors?: Nutritional  status - inadequate Patient is HIGH RISK - Oral Care Protocol followed (see row info): Yes Patient is AT RISK - Oral Care Protocol followed (see row info): Yes   Dysphagia Treatment Treatment focused on: Upgraded PO texture trials;Facilitation of pharyngeal phase;Patient/family/caregiver education Family/Caregiver Educated: Son and daughter-in-law Treatment Methods/Modalities: Skilled observation;Differential diagnosis Patient observed directly with PO's: Yes Type of PO's observed: Ice chips;Dysphagia 1 (puree);Thin liquids Feeding: Able to feed self Liquids provided via: Cup Pharyngeal Phase Signs & Symptoms: Suspected delayed swallow initiation;Multiple swallows;Delayed cough;Wet vocal quality;Immediate throat clear;Changes in respirations;Watery eyes;Complaints of globus;Complaints of residue Type of cueing: Verbal Amount of cueing: Minimal   GO    Moreen Fowler MS, CCC-SLP [phone] [facility] 10/06/2012, 2:27 PM

## 2012-10-06 NOTE — Progress Notes (Signed)
Stroke Team Progress Note  HISTORY Joe Snow is an 79 y.o. male with a past medical history significant for hypertension, diabetes, atrial fibrillation off coumadin due to diverticulitis with GI bleeding December 2013, who was in his usual state of health until 12:45 pm today 09/30/2012 when he started vomiting and was noted to have right arm weakness and right face droopiness.  He was taken to [facility] ED where he reported to have a NIHSS 5 and a CT brain that showed no acute intracranial abnormality.  There was a concerned about patient's bleeding last December as well as very elevated systolic blood pressure which apparently resulted in a delay in administering thrombolytics. I was informed by the ED physician that patient's daughter was demanding treatment with IV thrombolytics even when he was close to the 4 hours after onset of symptoms. He was transferred to [facility] for further management.  Upon arrival to the NICU his NIHSS was still 5, and he was complaining of a mild headache.  SUBJECTIVE  Feeling better today but getting very hungry. The patient is still n.p.o. Speech therapy has evaluated the patient and feels that he needs a FEEs study for further evaluation of swallowing prior to feeding. The patient and the family requested temporary tube feedings until he can be cleared for swallowing. We will be able to use this for the patient's medications as well. Upper endoscopy performed by Dr. guess her last evening revealed severe candidate esophagitis and he is to be treated with fluconazole.  OBJECTIVE Most recent Vital Signs: Filed Vitals:   10/05/12 1952 10/05/12 2016 10/06/12 0200 10/06/12 0600  BP: 198/83 161/76 155/59 164/88  Pulse:  81 84 86  Temp:  97.8 F (36.6 C) 97.9 F (36.6 C) 97.8 F (36.6 C)  TempSrc:  Oral Oral Oral  Resp:  16 16 16   Height:      Weight:      SpO2:  100% 98% 99%   CBG (last 3)   Recent Labs  10/05/12 1641 10/05/12 2101  10/06/12 0543  GLUCAP 152* 166* 141*   IV Fluid Intake:   . sodium chloride 75 mL/hr at 10/06/12 0631    MEDICATIONS  . feeding supplement  237 mL Oral BID BM  . fluconazole (DIFLUCAN) IV  50 mg Intravenous Q24H  . insulin aspart  0-15 Units Subcutaneous TID WC  . lisinopril  5 mg Oral Daily  . rivaroxaban  15 mg Oral Q supper  . simvastatin  5 mg Oral q1800   PRN:  bisacodyl, calcium carbonate, hydrALAZINE, ondansetron, senna-docusate  Diet:  NPO  Activity:  Activity as tolerated DVT Prophylaxis:  SCDs   CLINICALLY SIGNIFICANT STUDIES Basic Metabolic Panel:   Recent Labs Lab 10/02/12 0435 10/05/12 1550  NA 141 142  K 3.9 3.7  CL 106 101  CO2 27 31  GLUCOSE 172* 174*  BUN 33* 17  CREATININE 1.61* 1.26  CALCIUM 8.9 9.8   Liver Function Tests: No results found for this basename: AST, ALT, ALKPHOS, BILITOT, PROT, ALBUMIN,  in the last 168 hours CBC:   Recent Labs Lab 10/05/12 1550 10/06/12 0640  WBC 9.9 9.5  NEUTROABS  --  6.2  HGB 13.0 12.6*  HCT 40.6 40.3  MCV 86.0 86.1  PLT 186 207   Coagulation: No results found for this basename: LABPROT, INR,  in the last 168 hours Cardiac Enzymes: No results found for this basename: CKTOTAL, CKMB, CKMBINDEX, TROPONINI,  in the last 168 hours Urinalysis: No  results found for this basename: COLORURINE, APPERANCEUR, LABSPEC, PHURINE, GLUCOSEU, HGBUR, BILIRUBINUR, KETONESUR, PROTEINUR, UROBILINOGEN, NITRITE, LEUKOCYTESUR,  in the last 168 hours Lipid Panel    Component Value Date/Time   CHOL 125 10/01/2012 0325   TRIG 46 10/01/2012 0325   HDL 51 10/01/2012 0325   CHOLHDL 2.5 10/01/2012 0325   VLDL 9 10/01/2012 0325   LDLCALC 65 10/01/2012 0325   HgbA1C  Lab Results  Component Value Date   HGBA1C 8.0* 10/01/2012    Urine Drug Screen:   No results found for this basename: labopia,  cocainscrnur,  labbenz,  amphetmu,  thcu,  labbarb    Alcohol Level: No results found for this basename: ETH,  in the last 168 hours  MRI of the  brain  10/02/2012    Small acute non hemorrhagic infarcts inferior aspect of the right cerebellum and right posterior lateral aspect of the medulla.   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  Intracranial atherosclerotic type changes  2D Echocardiogram  EF 60-65% with no source of embolus.   Carotid Doppler  Bilateral: No evidence of hemodynamically significant internal carotid artery stenosis. Vertebral artery flow is antegrade. Right vertebral artery waveform is spiked with loss of diastolic component.  CXR  09/30/2012   Cardiomegaly with underlying chronic interstitial coarsening.   CXR 10/06/12 Resolved right base air space disease. Cardiomegaly, without acute disease.   Therapy Recommendations Plan is for SNF  Physical Exam  General - pleasant 79 year old male in no acute distress but with a very hoarse sounding voice. Heart - Regular rate and rhythm - no murmer Lungs - Clear to auscultation Skin - Warm and dr  Neurological Exam ; a wake and alert oriented x3 with normal speech and language. Extraocular moments are full range without nystagmus. Vision acuity and fields appear normal. Fundi were not visualized. There is no facial weakness. There is no dysarthria. Tongue is midline. Motor system exam reveals no upper or lower extremity drift. Fine finger movements are diminished on the right. There is mild finger-to-nose dysmetria on the right. He orbits left over right approximately. No lower extremity weakness. Sensation appears normal. Gait was not tested.   ASSESSMENT [name] is a 79 y.o. male presenting with right arm and face weakness.  Status post IV t-PA 09/30/2012 at 1630 at [facility], transferred to [facility]. tPA received 3h and 45 mins post symptom onset. 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  Stroke thromboembolic secondary to atrial fibrillation and right  vertebral artery occlusion.   Off warfarin since Dec 2013 for GIB. Now on Xarelto for secondary stroke prevention.  Patient with resultant right arm hemiparesis, facial droop, dysarthria. Work up completed.   Hypertension - has been treated with intravenous Apresoline well n.p.o. We will be able to resume lisinopril with NG tube. Diabetes, HgbA1c 8.0 Atrial fibrillation, taken off coumadin Dec 2013 due to GIB. Cardiology recommends resumption of coumadin. Patient does not want to go back on coumadin. Placed on NOAC  LDL 65- On Zocor Hx diverticulitis/GIB 06/2012 taken off coumadin at that time No Beta blockers per cardiology recommendations secondary to bradycardia and extreme fatique.  Endoscopy last night performed by Dr. Leone Payor consistent with candidiasis in the esophagus. Fluconazole therapy ordered.  The patient remains n.p.o. For FEE study tomorrow.  [facility] day # 6  TREATMENT/PLAN  Xarelto currently on hold for secondary stroke prevention secondary to  dysphagia. Resume with NG.  Eventual plan is for discharge to SNF in [place] for ongoing rehab. Discharge summary has already been prepared. Will likely need to delay discharge due to to the above noted events.  Monitor blood pressure closely.   Repeat chest x-ray today as above. A CBC this morning reveals hemoglobin 12.6. Wbc's 9.5 - afebrile  Place temporary NG feeding tube for medications and feedings. May need to be done in radiology. Nursing will attempt on 4 [place].  Delton See PA-C [facility] Pager [phone] 10/06/2012, 8:39 AM  S/p GI eval found to have Candida on antifungal now.   Failed speech eval today. NG tube placement for feeds/medications and FEES study tomorrow in AM.  Plan for SNF next week after passing swallow eval.

## 2012-10-07 ENCOUNTER — Inpatient Hospital Stay (HOSPITAL_COMMUNITY): Payer: Medicare Other

## 2012-10-07 ENCOUNTER — Encounter (HOSPITAL_COMMUNITY): Payer: Self-pay | Admitting: Internal Medicine

## 2012-10-07 DIAGNOSIS — R1310 Dysphagia, unspecified: Secondary | ICD-10-CM

## 2012-10-07 LAB — GLUCOSE, CAPILLARY
Glucose-Capillary: 122 mg/dL — ABNORMAL HIGH (ref 70–99)
Glucose-Capillary: 131 mg/dL — ABNORMAL HIGH (ref 70–99)
Glucose-Capillary: 124 mg/dL — ABNORMAL HIGH (ref 70–99)

## 2012-10-07 MED ORDER — JEVITY 1.2 CAL PO LIQD
1000.0000 mL | ORAL | Status: DC
  Administered 2012-10-07 – 2012-10-09 (×2): 1000 mL
  Filled 2012-10-07 (×6): qty 1000

## 2012-10-07 NOTE — Progress Notes (Signed)
Abd xray placement resulted. Results conveyed to Dr. Amada Jupiter. Instructed it was okay to give meds, but to advance 5cm for feedings that will start tomorrow. Abd xray ordered to confirm. Meds continued to be held because stylet needed for xray confirmation. Will continue to monitor.

## 2012-10-07 NOTE — Progress Notes (Signed)
Rehab admissions - I received call from Dr. Pearlean Brownie asking to re look at patient for acute inpatient rehab admission.  I spoke with patient and his wife.  Patient has extended his stroke and now has a tube feeding.  Wife now in favor of acute inpatient rehab admission.  Currently no rehab bed available, but will try to prioritize patient into rehab in the next couple days.  Call me for questions.  #[phone]

## 2012-10-07 NOTE — Progress Notes (Signed)
Physical Therapy Treatment Patient Details Name: Joe Snow MRN: [id number] DOB: 1923/09/07 Today's Date: 10/07/2012 Time: 8413-2440 PT Time Calculation (min): 19 min  PT Assessment / Plan / Recommendation Comments on Treatment Session  Pt with noted extension of stroke into anterior and posterior R cerebellum affecting pt breathing and swallowing. Due to this suspect pt would do better in SNF setting due to questionable ability for pt to tolerate 3 hours of therapy a day. Pt con't to be in good spirits and motivated however has shown a regression in mobility. acute PT to con't to follow.    Follow Up Recommendations  SNF;Supervision/Assistance - 24 hour     Does the patient have the potential to tolerate intense rehabilitation     Barriers to Discharge        Equipment Recommendations       Recommendations for Other Services    Frequency Min 4X/week   Plan Frequency remains appropriate;Discharge plan needs to be updated    Precautions / Restrictions Precautions Precautions: Fall Restrictions Weight Bearing Restrictions: No   Pertinent Vitals/Pain Pt denies pain    Mobility  Bed Mobility Bed Mobility: Supine to Sit Supine to Sit: 4: Min guard Sitting - Scoot to [place] of Bed: 4: Min guard Details for Bed Mobility Assistance: strong use of bilat UE and bed rails Transfers Transfers: Sit to Stand;Stand to Sit;Stand Pivot Transfers Sit to Stand: 1: +2 Total assist;With upper extremity assist;From bed Sit to Stand: Patient Percentage: 70% Stand to Sit: 1: +2 Total assist;With upper extremity assist;To chair/3-in-1 (v/c's for hand placement, control descent) Stand to Sit: Patient Percentage: 80% Stand Pivot Transfers: 1: +2 Total assist Stand Pivot Transfers: Patient Percentage: 70% Details for Transfer Assistance: max directional v/c's, + strong R lateral lean, ataxia of R LE, decreased R LE WBing Ambulation/Gait Ambulation/Gait Assistance: Not tested (comment) (due to  patient's, + SOB )    Exercises     PT Diagnosis:    PT Problem List:   PT Treatment Interventions:     PT Goals Acute Rehab PT Goals PT Goal: Supine/Side to Sit - Progress: Progressing toward goal PT Goal: Sit to Supine/Side - Progress: Progressing toward goal PT Goal: Sit to Stand - Progress: Progressing toward goal PT Goal: Stand to Sit - Progress: Progressing toward goal PT Transfer Goal: Bed to Chair/Chair to Bed - Progress: Progressing toward goal  Visit Information  Last PT Received On: 10/07/12 Assistance Needed: +2 PT/OT Co-Evaluation/Treatment: Yes    Subjective Data  Subjective: Pt received sitting up in bed agreeable to PT.   Cognition  Cognition Overall Cognitive Status: Impaired Area of Impairment: Safety/judgement;Awareness of errors Arousal/Alertness: Awake/alert Orientation Level: Appears intact for tasks assessed Behavior During Session: [facility] for tasks performed Safety/Judgement: Decreased awareness of safety precautions;Impulsive Awareness of Errors: Assistance required to identify errors made;Assistance required to correct errors made Awareness of Errors - Other Comments: appears unaware of R side lean, mod assist to correct with verbal cues and physical assist. Awareness of Deficits: pt unaware of leaning to the Right    Balance  Balance Balance Assessed: Yes Static Sitting Balance Static Sitting - Balance Support: Feet supported;Right upper extremity supported Static Sitting - Level of Assistance: 5: Stand by assistance;4: Min assist Static Sitting - Comment/# of Minutes: 4 min, pt with noted R lateral lean with fatigue and no UE support Static Standing Balance Static Standing - Balance Support: Bilateral upper extremity supported Static Standing - Level of Assistance: 1: +2 Total assist (80) Static  Standing - Comment/# of Minutes: 2  End of Session PT - End of Session Equipment Utilized During Treatment: Gait belt Activity Tolerance: Patient  tolerated treatment well Patient left: in chair;with call bell/phone within reach;with family/visitor present Nurse Communication: Mobility status   GP     Marcene Brawn 10/07/2012, 2:52 PM  Lewis Shock, PT, DPT Pager #: [phone] Office #: [phone]

## 2012-10-07 NOTE — Clinical Social Work Note (Signed)
Clinical Social Work   Pt is not medically ready for [employer]. CSW will update [employer]	[place]. CSW spoke with pt's wife. Pt's is reconsidering [facility]. Admissions coordinator from [facility] is following for possible admission. CSW will continue to follow to facilitate discharge to [facility], if appropriate.

## 2012-10-07 NOTE — Progress Notes (Signed)
Occupational Therapy Treatment Patient Details Name: Joe Snow MRN: [id number] DOB: [birth year] Today's Date: 10/07/2012 Time: 1308-6578 OT Time Calculation (min): 24 min  OT Assessment / Plan / Recommendation Comments on Treatment Session Pt with extension of CVA resulting in severe dysphagia requiring PANDA.  Pt, reportedly, had not been OOB all weekend.  Presents as much weaker today compared to last visit.  Requires multiple rest breaks for mouth suctioning and to catch his breath.  Pt. stated, " I feel like I am going backwards," in regard to his progress.    Follow Up Recommendations  SNF    Barriers to Discharge       Equipment Recommendations       Recommendations for Other Services    Frequency Min 3X/week   Plan Discharge plan needs to be updated    Precautions / Restrictions Precautions Precautions: Fall Restrictions Weight Bearing Restrictions: No   Pertinent Vitals/Pain No pain reported    ADL  Eating/Feeding: NPO Equipment Used: Gait belt;Rolling walker Transfers/Ambulation Related to ADLs: transferred only today with +2 total assist pt 70%, leans toward R ADL Comments: Pt continues to use R UE as lead to suction mouth.    OT Diagnosis:    OT Problem List:   OT Treatment Interventions:     OT Goals ADL Goals Pt Will Perform Grooming: with min assist;Supported;Sitting, chair Pt Will Transfer to Toilet: with max assist;Stand pivot transfer;3-in-1 ADL Goal: Toilet Transfer - Progress: Not met Pt Will Perform Toileting - Hygiene: with mod assist;Sit to stand from 3-in-1/toilet Miscellaneous OT Goals Miscellaneous OT Goal #1: Pt will use visual input attending to Rt UE for hand to mouth ADLS  Visit Information  Last OT Received On: 10/07/12 Assistance Needed: +2 PT/OT Co-Evaluation/Treatment: Yes    Subjective Data      Prior Functioning       Cognition  Cognition Overall Cognitive Status: Impaired Area of Impairment:  Safety/judgement;Awareness of errors Arousal/Alertness: Awake/alert Orientation Level: Appears intact for tasks assessed Behavior During Session: [facility] for tasks performed Safety/Judgement: Decreased awareness of safety precautions;Impulsive Awareness of Errors: Assistance required to identify errors made;Assistance required to correct errors made Awareness of Errors - Other Comments: appears unaware of R side lean, mod assist to correct with verbal cues and physical assist. Awareness of Deficits: pt unaware of leaning to the Right    Mobility  Bed Mobility Bed Mobility: Supine to Sit Supine to Sit: 4: Min guard Sitting - Scoot to Edge of Bed: 4: Min guard Details for Bed Mobility Assistance: strong use of bilat UE and bed rails Transfers Transfers: Sit to Stand;Stand to Sit Sit to Stand: 1: +2 Total assist;With upper extremity assist;From bed Sit to Stand: Patient Percentage: 70% Stand to Sit: 1: +2 Total assist;With upper extremity assist;To chair/3-in-1 (v/c's for hand placement, control descent) Stand to Sit: Patient Percentage: 80% Details for Transfer Assistance: max directional v/c's, + strong R lateral lean, ataxia of R LE, decreased R LE WBing    Exercises      Balance Balance Balance Assessed: Yes Static Sitting Balance Static Sitting - Balance Support: Feet supported;Right upper extremity supported Static Sitting - Level of Assistance: 5: Stand by assistance;4: Min assist Static Sitting - Comment/# of Minutes: 4 min, pt with noted R lateral lean with fatigue and no UE support Static Standing Balance Static Standing - Balance Support: Bilateral upper extremity supported Static Standing - Level of Assistance: 1: +2 Total assist (80) Static Standing - Comment/# of Minutes: 2  End of Session OT - End of Session Activity Tolerance: Patient limited by fatigue (needed frequent activity breaks to catch his breath) Patient left: in chair;with call bell/phone within reach;with  family/visitor present Nurse Communication: Mobility status  GO     Evern Bio 10/07/2012, 2:50 PM [phone]

## 2012-10-07 NOTE — Progress Notes (Signed)
Received call report for MRI results; results called to Annie Main, NP; continue TFs.

## 2012-10-07 NOTE — Progress Notes (Signed)
Stroke Team Progress Note  HISTORY Joe Snow is an 79 y.o. male with a past medical history significant for hypertension, diabetes, atrial fibrillation off coumadin due to diverticulitis with GI bleeding December 2013, who was in his usual state of health until 12:45 pm today 09/30/2012 when he started vomiting and was noted to have right arm weakness and right face droopiness.  He was taken to [facility] ED where he reported to have a NIHSS 5 and a CT brain that showed no acute intracranial abnormality.  There was a concerned about patient's bleeding last December as well as very elevated systolic blood pressure which apparently resulted in a delay in administering thrombolytics. I was informed by the ED physician that patient's daughter was demanding treatment with IV thrombolytics even when he was close to the 4 hours after onset of symptoms. He was transferred to [facility] for further management.  Upon arrival to the NICU his NIHSS was still 5, and he was complaining of a mild headache.  SUBJECTIVE Wife at bedside and  Male friend/family member. ST working with patient.  OBJECTIVE Most recent Vital Signs: Filed Vitals:   10/06/12 2200 10/07/12 0200 10/07/12 0557 10/07/12 0648  BP: 148/68 157/74 177/81 156/67  Pulse: 82 84 88   Temp: 97.4 F (36.3 C) 97.6 F (36.4 C) 98.1 F (36.7 C)   TempSrc: Oral Oral Oral   Resp: 18 18 18    Height:      Weight:      SpO2: 96% 97% 98%    CBG (last 3)   Recent Labs  10/06/12 1644 10/06/12 2113 10/07/12 0646  GLUCAP 138* 122* 131*   IV Fluid Intake:   . sodium chloride 75 mL/hr at 10/06/12 2042  . feeding supplement (JEVITY 1.2 CAL)      MEDICATIONS  . fluconazole (DIFLUCAN) IV  50 mg Intravenous Q24H  . insulin aspart  0-15 Units Subcutaneous TID WC  . lisinopril  5 mg Oral Daily  . rivaroxaban  15 mg Oral Q supper  . simvastatin  5 mg Oral q1800   PRN:  bisacodyl, calcium carbonate, hydrALAZINE, ondansetron,  senna-docusate  Diet:  NPO  Activity:  Activity as tolerated DVT Prophylaxis:  SCDs   CLINICALLY SIGNIFICANT STUDIES Basic Metabolic Panel:   Recent Labs Lab 10/02/12 0435 10/05/12 1550  NA 141 142  K 3.9 3.7  CL 106 101  CO2 27 31  GLUCOSE 172* 174*  BUN 33* 17  CREATININE 1.61* 1.26  CALCIUM 8.9 9.8   Liver Function Tests: No results found for this basename: AST, ALT, ALKPHOS, BILITOT, PROT, ALBUMIN,  in the last 168 hours CBC:   Recent Labs Lab 10/05/12 1550 10/06/12 0640  WBC 9.9 9.5  NEUTROABS  --  6.2  HGB 13.0 12.6*  HCT 40.6 40.3  MCV 86.0 86.1  PLT 186 207   Coagulation: No results found for this basename: LABPROT, INR,  in the last 168 hours Cardiac Enzymes: No results found for this basename: CKTOTAL, CKMB, CKMBINDEX, TROPONINI,  in the last 168 hours Urinalysis: No results found for this basename: COLORURINE, APPERANCEUR, LABSPEC, PHURINE, GLUCOSEU, HGBUR, BILIRUBINUR, KETONESUR, PROTEINUR, UROBILINOGEN, NITRITE, LEUKOCYTESUR,  in the last 168 hours Lipid Panel    Component Value Date/Time   CHOL 125 10/01/2012 0325   TRIG 46 10/01/2012 0325   HDL 51 10/01/2012 0325   CHOLHDL 2.5 10/01/2012 0325   VLDL 9 10/01/2012 0325   LDLCALC 65 10/01/2012 0325   HgbA1C  Lab Results  Component Value Date   HGBA1C 8.0* 10/01/2012    Urine Drug Screen:   No results found for this basename: labopia,  cocainscrnur,  labbenz,  amphetmu,  thcu,  labbarb    Alcohol Level: No results found for this basename: ETH,  in the last 168 hours  MRI of the brain  10/02/2012    Small acute non hemorrhagic infarcts inferior aspect of the right cerebellum and right posterior lateral aspect of the medulla.   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  Intracranial atherosclerotic type changes  2D Echocardiogram  EF 60-65% with no source of embolus.   Carotid Doppler  Bilateral: No evidence of hemodynamically significant internal carotid artery stenosis.  Vertebral artery flow is antegrade. Right vertebral artery waveform is spiked with loss of diastolic component.  CXR  09/30/2012   Cardiomegaly with underlying chronic interstitial coarsening.   CXR 10/06/12 Resolved right base air space disease. Cardiomegaly, without acute disease.   Therapy Recommendations Plan is for SNF  Physical Exam  General - pleasant 79 year old male in no acute distress but with a very hoarse sounding voice. Heart - Regular rate and rhythm - no murmer Lungs - Clear to auscultation Skin - Warm and dr  Neurological Exam ; awake and alert oriented x3 with normal speech and language. Extraocular moments are full range without nystagmus. Vision acuity and fields appear normal. Fundi were not visualized. There is no facial weakness. There is   Dysarthria with weak cough and gag and poor palatal movements.. Tongue is midline. Motor system exam reveals no upper or lower extremity drift. Fine finger movements are diminished on the right. There is mild finger-to-nose dysmetria on the right. He orbits left over right approximately. No lower extremity weakness. Sensation appears normal. Gait was not tested.   ASSESSMENT [name] is a 79 y.o. male presenting with right arm and face weakness. Status post IV t-PA 09/30/2012 at 1630 at [facility], transferred to [facility]. tPA received 3h and 45 mins post symptom onset. 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 Stroke thromboembolic secondary to atrial fibrillation and right vertebral artery occlusion.  Off warfarin since Dec 2013 for GIB. Now on Xarelto for secondary stroke prevention.  Patient with resultant right arm hemiparesis, facial droop, dysarthria, dysphagia. Work up completed.    Feel patient has looked physically better than his scan suggested. This weekend with dysphagia worsening, found esophageal candidiasis per endoscopy Sat night performed  by Dr. Leone Payor, fluconazole therapy ordered. No food bolus. This finding likely incidental and not related to stroke itself. Concerned that he may have had neuro worsening due to extension of his brainstem stroke from RVA occlusion.. Hypertension - has been treated with intravenous Apresoline while n.p.o. Lisinopril resumed via  NG tube. Diabetes, HgbA1c 8.0 Atrial fibrillation, taken off coumadin Dec 2013 due to GIB. Cardiology recommends resumption of coumadin. Patient does not want to go back on coumadin. Placed on NOAC  LDL 65- On Zocor Hx diverticulitis/GIB 06/2012 taken off coumadin at that time No Beta blockers per cardiology recommendations secondary to bradycardia and extreme fatique.  The patient remains n.p.o. For FEE study tomorrow.  [facility] day [facility]  TREATMENT/PLAN  Repeat MRI with thin slices  NG placed. Begin tube feedings. May need to consider PEG.  ST to follow swallow  Continue Xarelto for secondary stroke prevention if it can be crushed.  Wife now leaning  toward CIR instead of SNF in [place] for ongoing rehab given possible stroke extension and swallowing difficulties. Discharge summary has already been prepared.   Monitor blood pressure closely.   Annie Main, MSN, RN, ANVP-BC, ANP-BC, [facility] Pager: [phone] 10/07/2012 9:52 AM  I have personally obtained a history, examined the patient, evaluated imaging results, and formulated the assessment and plan of care. I agree with the above.  Delia Heady, MD

## 2012-10-07 NOTE — Progress Notes (Signed)
NUTRITION FOLLOW UP  Intervention:   D/C Glucerna Shake  Initiate Jevity 1.2 @ 20 ml/hr via NG tube and increase by 10 ml every 4 hours to goal rate of 65 ml/hr. At goal rate, tube feeding regimen will provide 1872 kcal (99% of needs), 86 grams of protein (96% of needs), and 1263 ml of H2O.    Nutrition Dx:   Inadequate oral intake now related to inability to eat as evidenced by NPO status.  Goal:   Pt to meet >/= 90% of their estimated nutrition needs; not met.   Monitor:   TF tolerance, weight, labs, swallow ability  Assessment:   Per previous note pt with taste alterations and decreased appetite. Pt admitted for stroke symptoms. Pt is s/p t-PA.  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   Pt developed severe esophageal thrush as noted by endoscopy. NG tube has been placed to start enteral feedings. Pt now NPO plan for FEE's today per SLP. Pt discussed during rounds and with RN.    Height: Ht Readings from Last 1 Encounters:  09/30/12 5\' 11"  (1.803 m)    Weight Status:   Wt Readings from Last 1 Encounters:  09/30/12 197 lb 8.5 oz (89.6 kg)  No new weight  Re-estimated needs:  Kcal: 1900-2100  Protein: 90-105 g  Fluid: 1.9-2.1 L  Skin: no issues noted  Diet Order: NPO   Intake/Output Summary (Last 24 hours) at 10/07/12 0918 Last data filed at 10/07/12 0000  Gross per 24 hour  Intake     60 ml  Output    560 ml  Net   -500 ml    Last BM: 3/7   Labs:   Recent Labs Lab 10/02/12 0435 10/05/12 1550  NA 141 142  K 3.9 3.7  CL 106 101  CO2 27 31  BUN 33* 17  CREATININE 1.61* 1.26  CALCIUM 8.9 9.8  GLUCOSE 172* 174*    CBG (last 3)   Recent Labs  10/06/12 1644 10/06/12 2113 10/07/12 0646  GLUCAP 138* 122* 131*    Scheduled Meds: . feeding supplement  237 mL Oral BID BM  . fluconazole (DIFLUCAN) IV  50 mg Intravenous Q24H  . insulin aspart  0-15 Units Subcutaneous TID  WC  . lisinopril  5 mg Oral Daily  . rivaroxaban  15 mg Oral Q supper  . simvastatin  5 mg Oral q1800    Continuous Infusions: . sodium chloride 75 mL/hr at 10/06/12 2042    [facility] RD, LDN, CNSC [phone] Pager [phone] After Hours Pager

## 2012-10-07 NOTE — Progress Notes (Signed)
Notified Annie Main, NP of KUB results; okay to start tube feeding in current position.

## 2012-10-07 NOTE — Progress Notes (Signed)
Speech Language Pathology Dysphagia Treatment Patient Details Name: Joe Snow MRN: [id number] DOB: 12/09/1923 Today's Date: 10/07/2012 Time: 1000-1030 SLP Time Calculation (min): 30 min  Assessment / Plan / Recommendation Clinical Impression  Treatment focused on dysphagia with emphasis on po readiness. Noted plans for FEES today however diagnostic treatment at bedside revealed continued s/s of a severe oropharyngeal dysphagia with potential esophageal component characterized by decreased hyolaryngeal elevation/excursion and immediate s/s of decreased airway protection (wet vocal quality, throat clearing, coughing). This SLP reviewed MBS video and report from 3/8 and although do agree that esophageal candida may be contributing to dysphagia, question if medullary CVA (with possible worsening per MD) may be primary contributing factor to severity of dysphagia. Do not feel at this time that FEES is likely to yield different results from most recent MBS. Discussed with patient, son, and wife who are in agreement. Plan to f/u for readiness at bedside. Hopeful for improvement in candida over the next 24-48 hours to obtain a more clear picture of origin of dysphagia. In the meantime, education complete with patient and wife regarding need for thorough oral care prior to ice chips, use of effortful swallow to facilitate improved strength of pharyngeal musculature, and maintainance of upright positioning to aid in airway protection. SLP will f/u 3/11.      Diet Recommendation  Continue with Current Diet: NPO    SLP Plan Continue with current plan of care   Pertinent Vitals/Pain None reported   Swallowing Goals  SLP Swallowing Goals Goal #3: Patient will consume diagnostic PO trials of various consistencies w/o s/s of aspiratioin to assess readiness to resume PO's Swallow Study Goal #3 - Progress: Progressing toward goal  General Temperature Spikes Noted: No Respiratory Status: Room  air Behavior/Cognition: Alert;Cooperative;Pleasant mood Oral Cavity - Dentition: Adequate natural dentition Patient Positioning: Upright in bed      Dysphagia Treatment Treatment focused on: Upgraded PO texture trials;Patient/family/caregiver education;Facilitation of pharyngeal phase;Utilization of compensatory strategies Family/Caregiver Educated: son and wife Treatment Methods/Modalities: Skilled observation;Differential diagnosis;Effortful swallow Patient observed directly with PO's: Yes Type of PO's observed: Ice chips Feeding: Able to feed self;Needs assist Liquids provided via: Teaspoon Pharyngeal Phase Signs & Symptoms: Multiple swallows;Immediate throat clear;Delayed cough;Wet vocal quality Type of cueing: Verbal Amount of cueing: Minimal   GO   Joe Lango MA, CCC-SLP [phone]   Joe Snow Joe Snow 10/07/2012, 2:04 PM

## 2012-10-07 NOTE — Progress Notes (Signed)
[facility] Progress Note  Patient Name: Joe Snow Date of Encounter: October 18, 2012, 3:38 PM    Subjective  Dysphagia and dysarthria persist Now has a naso-enteric tube in Plans for CIR transfer   Objective    Physical Exam: Filed Vitals:   2012-10-18 1435  BP: 158/72  Pulse:   Temp:   Resp:    General: NAD - naso-enteric tube in       Radiology/Studies:   Mr Brain Wo Contrast  Oct 18, 2012  *RADIOLOGY REPORT*  Clinical Data: Stroke last week treated with t-PA.  Developed severe dysphagia over the last couple days.  MRI HEAD WITHOUT CONTRAST  Technique:  Multiplanar, multiecho pulse sequences of the brain and surrounding structures were obtained according to standard protocol without intravenous contrast.  Comparison: 10/01/2012.  Findings: Motion degraded exam.  In this patient who was recently noted to have a right inferior cerebellar and right posterior lateral medulla infarct, there has been extension of the infarct with new small infarcts anterior right cerebellum and posterior right cerebellum.  No obvious intracranial hemorrhage (gradient sequence significantly motion degraded).  Prominent small vessel disease type changes.  Global atrophy.  No intracranial mass lesion detected on this unenhanced exam.  Transverse ligament hypertrophy.  Cervical spondylotic changes with spinal stenosis and slight cord flattening C2-3 and C3-4.  IMPRESSION: Motion degraded exam.  In this patient who was recently noted to have a right inferior cerebellar and right posterior lateral medulla infarct, there has been extension of the infarct with new small infarcts anterior right cerebellum and posterior right cerebellum.  No obvious intracranial hemorrhage (gradient sequence significantly motion degraded).  This has been made a PRA call report utilizing dashboard call feature.   Original Report Authenticated By: Lacy Duverney, M.D.        Assessment and Plan  1) Dysphagia - neurogenic  and pharyngeal 2) feeding difficulties 3) esophageal candidiasis   1) agree w/ naso-enteric tube - if not improving can place gastrostomy  (off xarelto) 2) treat esophageal candidiasis x 14 days - I doubt it is causing much problem - brushings to confirm still pending  Please call if ? Signing off  Iva Boop, MD, [facility] [phone] (pager) October 18, 2012 3:42 PM

## 2012-10-07 NOTE — Progress Notes (Signed)
New abd xray resulted. Results conveyed to Dr. Amada Jupiter. Instructed to again advance 2cm, but to pull stylet and give meds for they should not be held any longer. Instructed to place abd xray follow up for the morning to make sure placement was sufficient for feedings to begin. NG is in left nare at the 75cm mark.

## 2012-10-08 ENCOUNTER — Inpatient Hospital Stay (HOSPITAL_COMMUNITY): Payer: Medicare Other

## 2012-10-08 DIAGNOSIS — R1313 Dysphagia, pharyngeal phase: Secondary | ICD-10-CM

## 2012-10-08 LAB — GLUCOSE, CAPILLARY
Glucose-Capillary: 126 mg/dL — ABNORMAL HIGH (ref 70–99)
Glucose-Capillary: 133 mg/dL — ABNORMAL HIGH (ref 70–99)

## 2012-10-08 MED ORDER — CHLORPROMAZINE HCL 25 MG PO TABS
25.0000 mg | ORAL_TABLET | Freq: Three times a day (TID) | ORAL | Status: DC
  Filled 2012-10-08 (×3): qty 1

## 2012-10-08 MED ORDER — CHLORPROMAZINE HCL 25 MG/ML IJ SOLN
12.5000 mg | Freq: Three times a day (TID) | INTRAMUSCULAR | Status: DC
  Administered 2012-10-08 (×2): 12.5 mg via INTRAMUSCULAR
  Filled 2012-10-08 (×6): qty 0.5

## 2012-10-08 NOTE — Progress Notes (Signed)
Physical Therapy Treatment Patient Details Name: Joe Snow MRN: [id number] DOB: 02/07/1924 Today's Date: 10/08/2012 Time: 4098-1191 PT Time Calculation (min): 20 min  PT Assessment / Plan / Recommendation Comments on Treatment Session  Pt with significant improvement this date with ambulation tolerance and ability to transfer. Pt only with 2 episodes of panic due to SOB but pt able to calm self down. Pt remains to benefit from CIR upon d/c to maximize functional recovery.    Follow Up Recommendations  CIR;Supervision/Assistance - 24 hour     Does the patient have the potential to tolerate intense rehabilitation     Barriers to Discharge        Equipment Recommendations       Recommendations for Other Services    Frequency Min 4X/week   Plan Frequency remains appropriate;Discharge plan needs to be updated    Precautions / Restrictions Precautions Precautions: Fall Restrictions Weight Bearing Restrictions: No   Pertinent Vitals/Pain Pt denies pain    Mobility  Bed Mobility Bed Mobility: Supine to Sit Supine to Sit: 4: Min guard;HOB elevated Sitting - Scoot to Edge of Bed: 4: Min guard;With rail Details for Bed Mobility Assistance: strong use of bilat UE and bed rails Transfers Transfers: Sit to Stand;Stand to Sit Sit to Stand: 2: Max assist Stand to Sit: 3: Mod assist Details for Transfer Assistance: pt much improved this date with ability to control LEs and to achieve upright posture Ambulation/Gait Ambulation/Gait Assistance: 1: +2 Total assist (due to multiple lines and + R lateral lean) Ambulation/Gait: Patient Percentage: 70% Ambulation Distance (Feet): 60 Feet Assistive device: Rolling walker Ambulation/Gait Assistance Details: pt with improved coordination and motor planning however con't to push to the R despite max verbal and tactile cues to achieve midline position. pt unable to self-correct. Gait Pattern: Step-through pattern;Lateral trunk lean to  right;Decreased weight shift to left Gait velocity: decreased General Gait Details: narrowed BOS Stairs: No Modified Rankin (Stroke Patients Only) Pre-Morbid Rankin Score: No symptoms Modified Rankin: Moderately severe disability    Exercises     PT Diagnosis:    PT Problem List:   PT Treatment Interventions:     PT Goals Acute Rehab PT Goals PT Goal: Supine/Side to Sit - Progress: Progressing toward goal PT Goal: Sit to Stand - Progress: Progressing toward goal PT Goal: Stand to Sit - Progress: Progressing toward goal PT Goal: Ambulate - Progress: Progressing toward goal  Visit Information  Last PT Received On: 10/08/12 Assistance Needed: +2    Subjective Data  Subjective: Pt sitting up in bed agreeable to PT   Cognition  Cognition Overall Cognitive Status: Appears within functional limits for tasks assessed/performed Arousal/Alertness: Awake/alert Orientation Level: Appears intact for tasks assessed Behavior During Session: [facility] for tasks performed    Balance  Static Sitting Balance Static Sitting - Balance Support: Feet supported;Right upper extremity supported Static Sitting - Level of Assistance: 5: Stand by assistance;4: Min assist Static Sitting - Comment/# of Minutes: 3 min, required assist to regain balance when in unilateral UE support  End of Session PT - End of Session Equipment Utilized During Treatment: Gait belt;Oxygen (2.5 LO2 via Lilly) Activity Tolerance: Patient tolerated treatment well Patient left: in chair;with call bell/phone within reach;with family/visitor present Nurse Communication: Mobility status   GP     Marcene Brawn 10/08/2012, 4:17 PM  Lewis Shock, PT, DPT Pager #: [phone] Office #: [phone]

## 2012-10-08 NOTE — Progress Notes (Signed)
Speech Language Pathology Dysphagia Treatment Patient Details Name: Patterson Hollenbaugh MRN: [id number] DOB: 1924/04/12 Today's Date: 10/08/2012 Time: 5621-3086 SLP Time Calculation (min): 21 min  Assessment / Plan / Recommendation Clinical Impression  Treatment focused on dysphagia with emphasis on po readiness. Diagnistic treatment of dysphagia reveals continued s/s of a severe oropharyngeal dysphagia characterized by decreased hyolaryngeal elevation/excursion and immediate s/s of decreased airway protection (wet vocal quality, throat clearing, coughing). Again suspect primary oropharyngeal component related to extension of CVA. Patient able to complete oral care with min verbal instruction, then consume ice chips with continued s/s of decreased airway protection and supervision assist for use of effortful swallow to facilitate increased pharyngeal strength. Patient becoming SOB after 2 ice chips, requiring time to recovery. Education complete with both patient and daugther regarding rationale for thorough and frequent oral care both at baseline and prior to any ice chips for pleasure to decrease risk of an aspiration related infection. At this time, question if patient will require longer term means of nutrition as swallowing improves. Agree with CIR admission ASAP to begin intensive dysphagia therapy. Plan to continue to f/u for readiness at bedside.     Diet Recommendation  Continue with Current Diet: NPO    SLP Plan Continue with current plan of care   Pertinent Vitals/Pain None reported   Swallowing Goals  SLP Swallowing Goals Goal #3: Patient will consume diagnostic PO trials of various consistencies w/o s/s of aspiratioin to assess readiness to resume PO's Swallow Study Goal #3 - Progress: Progressing toward goal  General Temperature Spikes Noted: No Respiratory Status: Room air Behavior/Cognition: Alert;Cooperative;Pleasant mood Oral Cavity - Dentition: Adequate natural  dentition Patient Positioning: Upright in bed   Dysphagia Treatment Treatment focused on: Upgraded PO texture trials;Patient/family/caregiver education;Facilitation of pharyngeal phase;Utilization of compensatory strategies Family/Caregiver Educated: daughter Treatment Methods/Modalities: Skilled observation;Differential diagnosis;Effortful swallow Patient observed directly with PO's: Yes Type of PO's observed: Ice chips Feeding: Able to feed self;Needs assist Liquids provided via: Teaspoon Pharyngeal Phase Signs & Symptoms: Multiple swallows;Immediate throat clear;Delayed cough;Wet vocal quality Type of cueing: Verbal Amount of cueing: Minimal   GO   [name] MA, CCC-SLP [phone]   McCoy Leah [name] 10/08/2012, 9:30 AM

## 2012-10-08 NOTE — Progress Notes (Signed)
Stroke Team Progress Note  HISTORY Joe Snow is an 79 y.o. male with a past medical history significant for hypertension, diabetes, atrial fibrillation off coumadin due to diverticulitis with GI bleeding December 2013, who was in his usual state of health until 12:45 pm today 09/30/2012 when he started vomiting and was noted to have right arm weakness and right face droopiness.  He was taken to [facility] ED where he reported to have a NIHSS 5 and a CT brain that showed no acute intracranial abnormality.  There was a concerned about patient's bleeding last December as well as very elevated systolic blood pressure which apparently resulted in a delay in administering thrombolytics. I was informed by the ED physician that patient's daughter was demanding treatment with IV thrombolytics even when he was close to the 4 hours after onset of symptoms. He was transferred to [facility] for further management.  Upon arrival to the NICU his NIHSS was still 5, and he was complaining of a mild headache.  SUBJECTIVE Family at bedside. Patient awake, sitting up in bed.  OBJECTIVE Most recent Vital Signs: Filed Vitals:   10/07/12 1435 10/07/12 1824 10/07/12 2134 10/08/12 0212  BP: 158/72 181/82 127/75 160/73  Pulse:  74 87 84  Temp:  98.8 F (37.1 C) 98.4 F (36.9 C) 97.9 F (36.6 C)  TempSrc:  Oral Axillary Axillary  Resp:  18 22 22   Height:      Weight:      SpO2:  96% 97% 97%   CBG (last 3)   Recent Labs  10/07/12 1638 10/07/12 2132 10/08/12 0703  GLUCAP 123* 138* 126*   IV Fluid Intake:   . sodium chloride 75 mL/hr at 10/08/12 0403  . feeding supplement (JEVITY 1.2 CAL) 1,000 mL (10/07/12 1111)    MEDICATIONS  . fluconazole (DIFLUCAN) IV  50 mg Intravenous Q24H  . insulin aspart  0-15 Units Subcutaneous TID WC  . lisinopril  5 mg Oral Daily  . rivaroxaban  15 mg Oral Q supper  . simvastatin  5 mg Oral q1800   PRN:  bisacodyl, calcium carbonate, hydrALAZINE,  ondansetron, senna-docusate  Diet:  NPO tube feedings Activity:  Activity as tolerated DVT Prophylaxis:  SCDs   CLINICALLY SIGNIFICANT STUDIES Basic Metabolic Panel:   Recent Labs Lab 10/02/12 0435 10/05/12 1550  NA 141 142  K 3.9 3.7  CL 106 101  CO2 27 31  GLUCOSE 172* 174*  BUN 33* 17  CREATININE 1.61* 1.26  CALCIUM 8.9 9.8   Liver Function Tests: No results found for this basename: AST, ALT, ALKPHOS, BILITOT, PROT, ALBUMIN,  in the last 168 hours CBC:   Recent Labs Lab 10/05/12 1550 10/06/12 0640  WBC 9.9 9.5  NEUTROABS  --  6.2  HGB 13.0 12.6*  HCT 40.6 40.3  MCV 86.0 86.1  PLT 186 207   Coagulation: No results found for this basename: LABPROT, INR,  in the last 168 hours Cardiac Enzymes: No results found for this basename: CKTOTAL, CKMB, CKMBINDEX, TROPONINI,  in the last 168 hours Urinalysis: No results found for this basename: COLORURINE, APPERANCEUR, LABSPEC, PHURINE, GLUCOSEU, HGBUR, BILIRUBINUR, KETONESUR, PROTEINUR, UROBILINOGEN, NITRITE, LEUKOCYTESUR,  in the last 168 hours Lipid Panel    Component Value Date/Time   CHOL 125 10/01/2012 0325   TRIG 46 10/01/2012 0325   HDL 51 10/01/2012 0325   CHOLHDL 2.5 10/01/2012 0325   VLDL 9 10/01/2012 0325   LDLCALC 65 10/01/2012 0325   HgbA1C  Lab Results  Component Value Date   HGBA1C 8.0* 10/01/2012   Urine Drug Screen:   No results found for this basename: labopia,  cocainscrnur,  labbenz,  amphetmu,  thcu,  labbarb    Alcohol Level: No results found for this basename: ETH,  in the last 168 hours  MRI of the brain   10/07/2012  Motion degraded exam.  In this patient who was recently noted to have a right inferior cerebellar and right posterior lateral medulla infarct, there has been extension of the infarct with new small infarcts anterior right cerebellum and posterior right cerebellum.  No obvious intracranial hemorrhage (gradient sequence significantly motion degraded).   10/02/2012    Small acute non hemorrhagic  infarcts inferior aspect of the right cerebellum and right posterior lateral aspect of the medulla.   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  Intracranial atherosclerotic type changes  2D Echocardiogram  EF 60-65% with no source of embolus.   Carotid Doppler  Bilateral: No evidence of hemodynamically significant internal carotid artery stenosis. Vertebral artery flow is antegrade. Right vertebral artery waveform is spiked with loss of diastolic component.  CXR  09/30/2012   Cardiomegaly with underlying chronic interstitial coarsening.   CXR 10/06/12 Resolved right base air space disease. Cardiomegaly, without acute disease.  Therapy Recommendations CIR   Physical Exam  General - pleasant 79 year old male in no acute distress but with a very hoarse sounding voice. Heart - Regular rate and rhythm - no murmer Lungs - Clear to auscultation Skin - Warm and dr  Neurological Exam ; awake and alert oriented x3 with normal speech and language. Extraocular moments are full range without nystagmus. Vision acuity and fields appear normal. Fundi were not visualized. There is no facial weakness. There is   Dysarthria with weak cough and gag and poor palatal movements.. Tongue is midline. Motor system exam reveals no upper or lower extremity drift. Fine finger movements are diminished on the right. There is mild finger-to-nose dysmetria on the right. He orbits left over right approximately. No lower extremity weakness. Sensation appears normal. Gait was not tested.   ASSESSMENT [name] is a 79 y.o. male presenting with right arm and face weakness. Status post IV t-PA 09/30/2012 at 1630 at [facility], transferred to [facility]. tPA received 3h and 45 mins post symptom onset. 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 Stroke thromboembolic secondary to atrial fibrillation and right vertebral  artery occlusion. Felt patient has looked physically better than his scan suggested. 10/05/2012 had increased difficulty swallowing. Imaging revealed extension of previous stroke as well as new right cerebellum and posterior right cerebellum infarcts, now requiring feeding tube.  Off warfarin since Dec 2013 for GIB. Now on Xarelto for secondary stroke prevention. Patient with resultant right arm hemiparesis, facial droop, dysarthria, dysphagia. Work up completed.     10/05/2012 with dysphagia worsening, found esophageal candidiasis per endoscopy Sat night performed by Dr. Leone Payor, fluconazole therapy ordered. Needs tx x 14 days, no food bolus. This finding likely incidental and not related to stroke itself. GI agrees candidiasis is likely asymptomatic. GI signed off 10/08/2012. Hypertension - has been treated with intravenous Apresoline while n.p.o. Lisinopril resumed via  NG tube. Remains variable, though expected elevation just after new strokes, with last ones occurring Sat Diabetes, HgbA1c 8.0 Atrial fibrillation, taken off coumadin Dec 2013 due to GIB. Cardiology recommends resumption of coumadin. Patient does not want to  go back on coumadin. Placed on NOAC  LDL 65- On Zocor Hx diverticulitis/GIB 06/2012 taken off coumadin at that time No Beta blockers per cardiology recommendations secondary to bradycardia and extreme fatique.  Hiccups- will start thorazine . Hospital day # 8  TREATMENT/PLAN  Use panda for tube feedings 7-10 days. ST to follow swallow. If swallowing does not improve at that time, will consider PEG.   Continue Xarelto for secondary stroke prevention for now.   Await CIR bed instead of SNF. Discharge summary has already been prepared - will be updated at time of discharge  Add thorazine for hiccup management  Continue to monitor blood pressure.   Annie Main, MSN, RN, ANVP-BC, ANP-BC, [facility] Pager: [phone] 10/08/2012 8:26 AM  I have  personally obtained a history, examined the patient, evaluated imaging results, and formulated the assessment and plan of care. I agree with the above.  Delia Heady, MD

## 2012-10-08 NOTE — Progress Notes (Signed)
Abdominal KUB results called to Annie Main, NP; panda okay to use per Ms. Biby.

## 2012-10-08 NOTE — Progress Notes (Signed)
Rehab admissions - Continuing to follow.  I have no rehab bed available today.  Will follow up again in am for bed availability.  Call me for questions.  #[phone]

## 2012-10-08 NOTE — Progress Notes (Signed)
Quick Note:  + Candida On Tx at hospital ______

## 2012-10-08 NOTE — PMR Pre-admission (Signed)
PMR Admission Coordinator Pre-Admission Assessment  Patient: Joe Snow is an 79 y.o., male MRN: [id number] DOB: 03/14/1924 Height: 5\' 11"  (180.3 cm) Weight: 89.6 kg (197 lb 8.5 oz)              Insurance Information HMO:      PPO:       PCP:       IPA:       80/20:       OTHER:   PRIMARY: Medicare A/B      Policy#: [id number]      Subscriber: [name] CM Name:        Phone#:       Fax#:   Pre-Cert#:        Employer: Retired Benefits:  Phone #:       [name] Date: 07/01/91=A 04/30/02=B     Deduct: $1216      Out of Pocket Max: None      Life Max: Unlimited CIR: 100%      SNF: 100 days Outpatient: 80%     Co-Pay: 20% Home Health: 100%      Co-Pay: none DME: 80%     Co-Pay: 20% Providers: patient's choice  SECONDARY: [employer]      Policy#: [id number]      Subscriber: [name] CM Name:        Phone#:       Fax#:   Pre-Cert#:        Employer: Retired Benefits:  Phone #: [phone]     Name:   Eff. Date:       Deduct:        Out of Pocket Max:        Life Max:   CIR:        SNF:   Outpatient:       Co-Pay:   Home Health:        Co-Pay:   DME:       Co-Pay:    Emergency Contact Information Contact Information   Name Relation Home Work Mobile   Loser,[name] Spouse [phone]  [phone]   Miotke,[name] Son [phone]  [phone]     Current Medical History  Patient Admitting Diagnosis: R cerebellum and R posterior lateral infarcts   History of Present Illness:  An 79 y.o. right-handed male was documented history of hypertension, diabetes mellitus and atrial fibrillation off Coumadin due to diverticulitis with GI bleeding December 2013. Patient admitted 09/30/2012 with right-sided weakness and facial droop. He was initially taken to [facility] with cranial CT scan showing no acute intracranial abnormality. He was transferred to [facility] for further management. Patient did receive TPA at [facility]. Neurology  service followup with workup ongoing and presently maintained on aspirin therapy. MRI of the brain 3/4 Small acute non hemorrhagic infarcts inferior aspect of the right cerebellum and right posterior lateral aspect of the medulla.  Carotid Dopplers with no ICA stenosis. Cardiology followup for history of atrial fibrillation and await echocardiogram results. Followup speech therapy for mild expressive aphasia and currently maintained on a regular consistency diet. Occupational therapy evaluation completed 10/01/2012 with recommendations for physical medicine rehabilitation consult to consider inpatient rehabilitation services.  On 3/7, was scheduled to go to [facility] in [place].  However, on 3/8 had increase in BP 200/90 and increase in dysphagia, having trouble swallowing.  Made NPO.  Neuro saw on 3/8 and questioned extension of stroke.  GI consult done 3/8.  Upper endo 3/8 revealed severe  candida esophagitis.  On 3/10 NG tube feeding started.  Patient participating well with therapies and continues to remain appropriate for acute inpatient rehab admission.  On 3/13 had an episode of waking up gasping for air and very anxious.  Neurology saw patient and ordered CPAP after ABG showed CO2 retention.     Total: 2=NIH  Past Medical History  Past Medical History  Diagnosis Date  . Hypertension associated with diabetes   . Diabetes   . Atrial fibrillation   . Embolic stroke involving left vertebral artery 10/02/2012  . Dysphagia, pharyngeal-neurogenic 10/05/2012    Family History  family history is not on file.  Prior Rehab/Hospitalizations:  No previous rehab admissions.   Current Medications  Current facility-administered medications:acetaminophen (TYLENOL) tablet 650 mg, 650 mg, Per NG tube, Q6H PRN, Ritta Slot, MD, 650 mg at 10/09/12 0528;  bisacodyl (DULCOLAX) EC tablet 5 mg, 5 mg, Oral, Daily PRN, Layne Benton, NP, 5 mg at 10/04/12 1849 calcium carbonate (TUMS - dosed in mg  elemental calcium) chewable tablet 200-400 mg of elemental calcium, 1-2 tablet, Oral, BID BM & HS PRN, Ritta Slot, MD, 200 mg of elemental calcium at 10/05/12 0505;  chlorproMAZINE (THORAZINE) injection 6.25 mg, 6.25 mg, Intramuscular, Q8H PRN, Layne Benton, NP feeding supplement (JEVITY 1.2 CAL) liquid 1,000 mL, 1,000 mL, Per Tube, Continuous, Heather Cornelison Pitts, RD, Last Rate: 65 mL/hr at 10/09/12 0030, 1,000 mL at 10/09/12 0030;  fluconazole (DIFLUCAN) IVPB 50 mg, 50 mg, Intravenous, Q24H, Jessica C Carney, RPH, 50 mg at 10/08/12 1753;  hydrALAZINE (APRESOLINE) injection 5 mg, 5 mg, Intravenous, Q4H PRN, David L Rinehuls, PA-C, 5 mg at 10/08/12 0741 insulin aspart (novoLOG) injection 0-15 Units, 0-15 Units, Subcutaneous, TID WC, Thana Farr, MD, 3 Units at 10/09/12 0853;  lisinopril (PRINIVIL,ZESTRIL) tablet 5 mg, 5 mg, Oral, Daily, Jessica A Hope, PA-C, 5 mg at 10/09/12 1020;  ondansetron (ZOFRAN) injection 4 mg, 4 mg, Intravenous, Q6H PRN, Thana Farr, MD, 4 mg at 10/09/12 0055;  Rivaroxaban (XARELTO) tablet TABS 15 mg, 15 mg, Oral, Q supper, Layne Benton, NP, 15 mg at 10/08/12 1753 senna-docusate (Senokot-S) tablet 1 tablet, 1 tablet, Oral, QHS PRN, [name], MD;  simvastatin (ZOCOR) tablet 5 mg, 5 mg, Oral, q1800, Jessica A Hope, PA-C, 5 mg at 10/08/12 1753  Patients Current Diet: NPO  Precautions / Restrictions Precautions Precautions: Fall Precaution Comments: decreased safety awareness Restrictions Weight Bearing Restrictions: No   Prior Activity Level Community (5-7x/wk): Worked fulltime in Occupational psychologist.  Self employed.  Home Assistive Devices / Equipment Home Assistive Devices/Equipment: None Home Adaptive Equipment: Hand-held shower hose;Shower chair with back;Grab bars in shower  Prior Functional Level Prior Function Level of Independence: Independent Able to Take Stairs?: Yes Driving: Yes Vocation: Retired  Current Functional  Level Cognition  Arousal/Alertness: Awake/alert Overall Cognitive Status: Appears within functional limits for tasks assessed Overall Cognitive Status: Appears within functional limits for tasks assessed/performed Memory Deficits: pt states "left side is the strong side" when asked this session. Pt did make error once during session stating "right side" Orientation Level: Oriented X4 Safety/Judgement: Decreased awareness of safety precautions;Impulsive Awareness of Errors: Assistance required to identify errors made;Assistance required to correct errors made Awareness of Errors - Other Comments: appears unaware of R side lean, mod assist to correct with verbal cues and physical assist. Awareness of Deficits: pt unaware of leaning to the Right Cognition - Other Comments: inconsistantly remembering safety cues dispite near constant reinforcement.   Attention: Focused;Sustained;Selective;Alternating  Focused Attention: Appears intact Sustained Attention: Appears intact Selective Attention: Appears intact Alternating Attention: Impaired Alternating Attention Impairment: Verbal complex;Functional complex Memory: Impaired Memory Impairment: Storage deficit;Decreased recall of new information Awareness: Impaired Awareness Impairment: Anticipatory impairment Executive Function: Reasoning Reasoning: Impaired Reasoning Impairment: Verbal complex    Extremity Assessment (includes Sensation/Coordination)  RUE ROM/Strength/Tone: Within functional levels RUE Sensation: WFL - Light Touch RUE Coordination:  (Brunstrom V- tremor, ataxic)  RLE ROM/Strength/Tone: Deficits RLE ROM/Strength/Tone Deficits: 4/5 RLE Sensation: WFL - Light Touch RLE Coordination: Deficits RLE Coordination Deficits: decreased coordination right leg for heel to shin test right compared to left.  Decreased functional coordination while stepping around to chair (ataxic type movements, decreased ability to place foot where he  wants it.      ADLs  Eating/Feeding: NPO Where Assessed - Eating/Feeding: Chair (uncontrolled Rt UE AROM) Grooming: Brushing hair;Maximal assistance (assist for balance, used R hand effectively) Where Assessed - Grooming: Supported standing Upper Body Dressing: Minimal assistance Where Assessed - Upper Body Dressing: Unsupported sitting Lower Body Dressing: Minimal assistance (long sitting in bed with LOB x3 to the Right) Where Assessed - Lower Body Dressing: Supine, head of bed flat Toilet Transfer: +2 Total assistance Toilet Transfer: Patient Percentage: 60% Toilet Transfer Method: Sit to stand Toilet Transfer Equipment: Comfort height toilet;Grab bars Toileting - Clothing Manipulation and Hygiene: +1 Total assistance Where Assessed - Toileting Clothing Manipulation and Hygiene: Sit to stand from 3-in-1 or toilet Equipment Used: Gait belt;Rolling walker Transfers/Ambulation Related to ADLs: transferred only today with +2 total assist pt 70%, leans toward R ADL Comments: Pt continues to use R UE as lead to suction mouth.    Mobility  Bed Mobility: Supine to Sit Supine to Sit: 4: Min guard;HOB elevated Sitting - Scoot to Edge of Bed: 4: Min guard;With rail    Transfers  Transfers: Sit to Stand;Stand to Sit Sit to Stand: 2: Max assist Sit to Stand: Patient Percentage: 70% Stand to Sit: 3: Mod assist Stand to Sit: Patient Percentage: 80% Stand Pivot Transfers: 1: +2 Total assist Stand Pivot Transfers: Patient Percentage: 70%    Ambulation / Gait / Stairs / Wheelchair Mobility  Ambulation/Gait Ambulation/Gait Assistance: 1: +2 Total assist (due to multiple lines and + R lateral lean) Ambulation/Gait: Patient Percentage: 70% Ambulation Distance (Feet): 60 Feet Assistive device: Rolling walker Ambulation/Gait Assistance Details: pt with improved coordination and motor planning however con't to push to the R despite max verbal and tactile cues to achieve midline position. pt unable  to self-correct. Gait Pattern: Step-through pattern;Lateral trunk lean to right;Decreased weight shift to left Gait velocity: decreased General Gait Details: narrowed BOS Stairs: No Wheelchair Mobility Wheelchair Mobility: No    Posture / Balance Static Sitting Balance Static Sitting - Balance Support: Feet supported;Right upper extremity supported Static Sitting - Level of Assistance: 5: Stand by assistance;4: Min assist Static Sitting - Comment/# of Minutes: 3 min, required assist to regain balance when in unilateral UE support Dynamic Sitting Balance Dynamic Sitting - Balance Support: No upper extremity supported;Feet supported Dynamic Sitting - Level of Assistance: 5: Stand by assistance Dynamic Sitting Balance - Compensations: 2 mins in prep for standing.   Static Standing Balance Static Standing - Balance Support: Bilateral upper extremity supported Static Standing - Level of Assistance: 1: +2 Total assist (80) Static Standing - Comment/# of Minutes: 2 Dynamic Standing Balance Dynamic Standing - Balance Support: Bilateral upper extremity supported Dynamic Standing - Level of Assistance: 1: +2 Total assist Dynamic Standing - Balance Activities:  (  while transferring to the chair) Dynamic Standing - Comments: assist needed for trunk stability for balance, weight shift to the left and decreased anterior weight shift. Verbal cues for foot placement sequencing and safety with movement.      Special needs/care consideration BiPAP/CPAP Yes, started 10/09/12.  Not on CPAP at home CPM No Continuous Drip IV 0.45 NS at 72ml/hr Dialysis NO       Life Vest No Oxygen Yes, on O2 on acute 10/09/12 Special Bed No Trach Size No Wound Vac (area) No      Skin No                              Bowel mgmt: Had BM on 10/02/12 Bladder mgmt: Has a condom catheter Diabetic mgmt Yes, insulin dependent and very compliant at home.     Previous Home Environment Living Arrangements: Spouse/significant  other Lives With: Spouse Available Help at Discharge: Family;Available 24 hours/day Type of Home: House Home Layout: Two level Alternate Level Stairs-Number of Steps: 20 (pt sleeps downstairs) Home Access: Stairs to enter Entrance Stairs-Rails: None Entrance Stairs-Number of Steps: 4 Bathroom Shower/Tub: Tub/shower unit;Curtain;Walk-in shower Bathroom Toilet: Handicapped height Bathroom Accessibility: Yes How Accessible: Accessible via walker;Accessible via wheelchair Home Care Services: No  Discharge Living Setting Plans for Discharge Living Setting: Patient's home;House;Lives with (comment) (Lives with wife.) Type of Home at Discharge: House Discharge Home Layout: Two level;Able to live on main level with bedroom/bathroom Alternate Level Stairs-Number of Steps: 20 Discharge Home Access: Stairs to enter Entrance Stairs-Number of Steps: 3 (R porch has 1/2 step entry.  May build it up or ramp it.) Do you have any problems obtaining your medications?: No  Social/Family/Support Systems Patient Roles: Spouse;Parent (Has 3 children from his previous marriage.) Contact Information: [employer] - wife Anticipated Caregiver: wife Anticipated Industrial/product designer Information: Jeronimo Norma ([phone] (c) [phone] Ability/Limitations of Caregiver: Wife is retired and can assist.  Has been married 47 yrs. Caregiver Availability: 24/7 Discharge Plan Discussed with Primary Caregiver: Yes Is Caregiver In Agreement with Plan?: Yes Does Caregiver/Family have Issues with Lodging/Transportation while Pt is in Rehab?: No    Goals/Additional Needs Patient/Family Goal for Rehab: PT/OT/ST supervision goals Expected length of stay: 2 weeks Cultural Considerations: None Dietary Needs: NPO with panda tube feeds Equipment Needs: TBD Special Service Needs: Likely will need a sleep apnea study after discharge.  Having difficulty waking up with shortness of breath. Pt/Family Agrees to Admission  and willing to participate: Yes Program Orientation Provided & Reviewed with Pt/Caregiver Including Roles  & Responsibilities: Yes    Decrease burden of Care through IP rehab admission: Diet advancement, Decrease number of caregivers and Patient/family education   Possible need for SNF placement upon discharge:Yes.  If patient does not make suspected functional gains, may need SNF upon discharge.  Patient Condition:  This patient's medical and functional status has changed since the consult dated: 10/02/12 . After evaluating the patient today and speaking with the CIR MD and the acute team, we feel that the patient is appropriate for inpatient rehab. Please see updated history above.  Will admit to inpatient rehab today.  Preadmission Screen Completed By:  Trish Mage, 10/09/2012 10:52 AM ______________________________________________________________________   Discussed status with Dr. Riley Kill on 10/09/12 at 1000 and received telephone approval for admission today.  Admission Coordinator:  Trish Mage, time1053/Date03/12/14

## 2012-10-08 NOTE — Clinical Social Work Note (Signed)
Clinical Social Work   CSW spoke with [employer]. CSW udpated facility and bed is available at this time and in the near future. Pt will need to be on a diet or have a PEG for SNF place. Per MD, pt will be on NG feedings for the next 7-10 days in hopes that swallowing will improve, then a PEG will be considered. CIR is continuing to follow, as this may be the most appropriate discharge venue. CSW will continue to follow during this admission for discharge needs.   Dede Query, MSW, Theresia Majors [phone]

## 2012-10-09 ENCOUNTER — Inpatient Hospital Stay (HOSPITAL_COMMUNITY): Payer: Medicare Other

## 2012-10-09 ENCOUNTER — Inpatient Hospital Stay (HOSPITAL_COMMUNITY)
Admission: RE | Admit: 2012-10-09 | Discharge: 2012-10-11 | DRG: 945 | Disposition: A | Discharge status: Other Acute Inpatient Hospital | Payer: Medicare Other | Source: Intra-hospital | Attending: Internal Medicine | Admitting: Internal Medicine

## 2012-10-09 DIAGNOSIS — R1310 Dysphagia, unspecified: Secondary | ICD-10-CM

## 2012-10-09 DIAGNOSIS — Z9282 Status post administration of tPA (rtPA) in a different facility within the last 24 hours prior to admission to current facility: Secondary | ICD-10-CM

## 2012-10-09 DIAGNOSIS — K5730 Diverticulosis of large intestine without perforation or abscess without bleeding: Secondary | ICD-10-CM | POA: Diagnosis present

## 2012-10-09 DIAGNOSIS — Z87891 Personal history of nicotine dependence: Secondary | ICD-10-CM

## 2012-10-09 DIAGNOSIS — R1313 Dysphagia, pharyngeal phase: Secondary | ICD-10-CM | POA: Diagnosis present

## 2012-10-09 DIAGNOSIS — G4733 Obstructive sleep apnea (adult) (pediatric): Secondary | ICD-10-CM | POA: Diagnosis present

## 2012-10-09 DIAGNOSIS — B3781 Candidal esophagitis: Secondary | ICD-10-CM | POA: Diagnosis present

## 2012-10-09 DIAGNOSIS — Z5189 Encounter for other specified aftercare: Principal | ICD-10-CM

## 2012-10-09 DIAGNOSIS — R29810 Facial weakness: Secondary | ICD-10-CM | POA: Diagnosis present

## 2012-10-09 DIAGNOSIS — J9600 Acute respiratory failure, unspecified whether with hypoxia or hypercapnia: Secondary | ICD-10-CM | POA: Diagnosis not present

## 2012-10-09 DIAGNOSIS — J441 Chronic obstructive pulmonary disease with (acute) exacerbation: Secondary | ICD-10-CM | POA: Diagnosis present

## 2012-10-09 DIAGNOSIS — E1149 Type 2 diabetes mellitus with other diabetic neurological complication: Secondary | ICD-10-CM | POA: Diagnosis present

## 2012-10-09 DIAGNOSIS — E119 Type 2 diabetes mellitus without complications: Secondary | ICD-10-CM

## 2012-10-09 DIAGNOSIS — I10 Essential (primary) hypertension: Secondary | ICD-10-CM | POA: Diagnosis present

## 2012-10-09 DIAGNOSIS — I6330 Cerebral infarction due to thrombosis of unspecified cerebral artery: Secondary | ICD-10-CM

## 2012-10-09 DIAGNOSIS — E1142 Type 2 diabetes mellitus with diabetic polyneuropathy: Secondary | ICD-10-CM | POA: Diagnosis present

## 2012-10-09 DIAGNOSIS — I6340 Cerebral infarction due to embolism of unspecified cerebral artery: Secondary | ICD-10-CM | POA: Diagnosis present

## 2012-10-09 DIAGNOSIS — I639 Cerebral infarction, unspecified: Secondary | ICD-10-CM

## 2012-10-09 DIAGNOSIS — R5381 Other malaise: Secondary | ICD-10-CM | POA: Diagnosis present

## 2012-10-09 DIAGNOSIS — R5383 Other fatigue: Secondary | ICD-10-CM | POA: Diagnosis present

## 2012-10-09 DIAGNOSIS — I4891 Unspecified atrial fibrillation: Secondary | ICD-10-CM | POA: Diagnosis present

## 2012-10-09 DIAGNOSIS — R066 Hiccough: Secondary | ICD-10-CM | POA: Diagnosis present

## 2012-10-09 LAB — BLOOD GAS, ARTERIAL
Acid-Base Excess: 4.2 mmol/L — ABNORMAL HIGH (ref 0.0–2.0)
Acid-Base Excess: 3.7 mmol/L — ABNORMAL HIGH (ref 0.0–2.0)
Bicarbonate: 29.5 mEq/L — ABNORMAL HIGH (ref 20.0–24.0)
FIO2: 0.5 %
TCO2: 31.2 mmol/L (ref 0–100)
pCO2 arterial: 55.2 mmHg — ABNORMAL HIGH (ref 35.0–45.0)
pCO2 arterial: 57.6 mmHg (ref 35.0–45.0)
pH, Arterial: 7.326 — ABNORMAL LOW (ref 7.350–7.450)
pO2, Arterial: 153 mmHg — ABNORMAL HIGH (ref 80.0–100.0)

## 2012-10-09 LAB — GLUCOSE, CAPILLARY
Glucose-Capillary: 178 mg/dL — ABNORMAL HIGH (ref 70–99)
Glucose-Capillary: 195 mg/dL — ABNORMAL HIGH (ref 70–99)

## 2012-10-09 MED ORDER — ONDANSETRON HCL 4 MG PO TABS: 4.0000 mg | ORAL_TABLET | Freq: Four times a day (QID) | ORAL | Status: DC | PRN

## 2012-10-09 MED ORDER — ACETAMINOPHEN 325 MG PO TABS
650.0000 mg | ORAL_TABLET | Freq: Four times a day (QID) | ORAL | Status: DC | PRN
  Administered 2012-10-09: 650 mg via NASOGASTRIC
  Filled 2012-10-09: qty 2

## 2012-10-09 MED ORDER — ONDANSETRON HCL 4 MG/2ML IJ SOLN: 4.0000 mg | Freq: Four times a day (QID) | INTRAMUSCULAR | Status: DC | PRN

## 2012-10-09 MED ORDER — FLUCONAZOLE 100MG IVPB
50.0000 mg | INTRAVENOUS | Status: DC
Start: 2012-10-09 — End: 2012-10-11
  Administered 2012-10-09: 50 mg via INTRAVENOUS
  Filled 2012-10-09 (×3): qty 25

## 2012-10-09 MED ORDER — INSULIN ASPART 100 UNIT/ML ~~LOC~~ SOLN
0.0000 [IU] | Freq: Four times a day (QID) | SUBCUTANEOUS | Status: DC
  Administered 2012-10-10: 3 [IU] via SUBCUTANEOUS
  Administered 2012-10-10: 2 [IU] via SUBCUTANEOUS
  Administered 2012-10-10: 3 [IU] via SUBCUTANEOUS
  Administered 2012-10-10 – 2012-10-11 (×2): 5 [IU] via SUBCUTANEOUS

## 2012-10-09 MED ORDER — FLUCONAZOLE 100MG IVPB: 50.0000 mg | INTRAVENOUS | Status: DC

## 2012-10-09 MED ORDER — LISINOPRIL 5 MG PO TABS
5.0000 mg | ORAL_TABLET | Freq: Every day | ORAL | Status: DC
  Administered 2012-10-10: 5 mg via ORAL
  Filled 2012-10-09 (×2): qty 1

## 2012-10-09 MED ORDER — CALCIUM CARBONATE ANTACID 500 MG PO CHEW
1.0000 | CHEWABLE_TABLET | Freq: Two times a day (BID) | ORAL | Status: DC | PRN
  Filled 2012-10-09: qty 2

## 2012-10-09 MED ORDER — ACETAMINOPHEN 325 MG PO TABS
650.0000 mg | ORAL_TABLET | Freq: Four times a day (QID) | ORAL | Status: DC | PRN
  Administered 2012-10-10: 650 mg via NASOGASTRIC
  Filled 2012-10-09 (×2): qty 2

## 2012-10-09 MED ORDER — CHLORPROMAZINE HCL 25 MG/ML IJ SOLN
6.2500 mg | Freq: Three times a day (TID) | INTRAMUSCULAR | Status: DC | PRN
  Filled 2012-10-09: qty 0.25

## 2012-10-09 MED ORDER — BISACODYL 5 MG PO TBEC: 5.0000 mg | DELAYED_RELEASE_TABLET | Freq: Every day | ORAL | Status: DC | PRN

## 2012-10-09 MED ORDER — RIVAROXABAN 15 MG PO TABS
15.0000 mg | ORAL_TABLET | Freq: Every day | ORAL | Status: DC
  Administered 2012-10-09: 15 mg via ORAL
  Filled 2012-10-09 (×4): qty 1

## 2012-10-09 MED ORDER — SENNOSIDES-DOCUSATE SODIUM 8.6-50 MG PO TABS: 1.0000 | ORAL_TABLET | Freq: Every evening | ORAL | Status: DC | PRN

## 2012-10-09 MED ORDER — SIMVASTATIN 5 MG PO TABS
5.0000 mg | ORAL_TABLET | Freq: Every day | ORAL | Status: DC
  Administered 2012-10-09: 5 mg via ORAL
  Filled 2012-10-09 (×2): qty 1

## 2012-10-09 MED ORDER — JEVITY 1.2 CAL PO LIQD
1000.0000 mL | ORAL | Status: DC
  Filled 2012-10-09 (×4): qty 1000

## 2012-10-09 NOTE — Progress Notes (Signed)
Placed patient on CPAP with a full face mask to allow seal around ng tube (MD aware), 10 cm H2O, humidity and 2 Liters O2 bleed in.   Patieint tolerating CPAP well.

## 2012-10-09 NOTE — Clinical Social Work Note (Signed)
Clinical Social Work   Pt is ready for discharge today to CIR. Pt and family are agreeable to discharge plan. CSW updated SNF, as discharge plan has changed. CSW is signing off as no further needs identified.   Dede Query, MSW, Theresia Majors [phone]

## 2012-10-09 NOTE — Progress Notes (Signed)
Order received to stop patient's fluids. Carried out.

## 2012-10-09 NOTE — Progress Notes (Signed)
Stroke Team Progress Note  HISTORY Joe Snow is an 79 y.o. male with a past medical history significant for hypertension, diabetes, atrial fibrillation off coumadin due to diverticulitis with GI bleeding December 2013, who was in his usual state of health until 12:45 pm today 09/30/2012 when he started vomiting and was noted to have right arm weakness and right face droopiness.  He was taken to [facility] ED where he reported to have a NIHSS 5 and a CT brain that showed no acute intracranial abnormality.  There was a concerned about patient's bleeding last December as well as very elevated systolic blood pressure which apparently resulted in a delay in administering thrombolytics. I was informed by the ED physician that patient's daughter was demanding treatment with IV thrombolytics even when he was close to the 4 hours after onset of symptoms. He was transferred to [facility] for further management.  Upon arrival to the NICU his NIHSS was still 5, and he was complaining of a mild headache.  SUBJECTIVE Family at bedside - wife a 2 females. Patient awake, sitting up in bed. Had respiratory difficulties during the night last night which was felt to be from sleep apnea and improved with CPAP. He has not formally been diagnosed with sleep apnea. He is a cup is almost completely gone on Thorazine but he as been quite sleepy which may be a side effect. .  OBJECTIVE Most recent Vital Signs: Filed Vitals:   10/08/12 2200 10/09/12 0200 10/09/12 0734 10/09/12 0900  BP: 151/84 136/51 130/74 173/89  Pulse: 83 53 85 68  Temp: 98.3 F (36.8 C) 97.8 F (36.6 C) 97.9 F (36.6 C) 97.6 F (36.4 C)  TempSrc:      Resp: 16 18 18 18   Height:      Weight:      SpO2: 100% 100% 97% 100%   CBG (last 3)   Recent Labs  10/08/12 1709 10/08/12 2234 10/09/12 0640  GLUCAP 178* 178* 195*   IV Fluid Intake:   . feeding supplement (JEVITY 1.2 CAL) 1,000 mL (10/09/12 0030)    MEDICATIONS  .  chlorproMAZINE  12.5 mg Intramuscular Q8H  . fluconazole (DIFLUCAN) IV  50 mg Intravenous Q24H  . insulin aspart  0-15 Units Subcutaneous TID WC  . lisinopril  5 mg Oral Daily  . rivaroxaban  15 mg Oral Q supper  . simvastatin  5 mg Oral q1800   PRN:  acetaminophen, bisacodyl, calcium carbonate, hydrALAZINE, ondansetron, senna-docusate  Diet:  NPO  Activity:  Activity as tolerated DVT Prophylaxis:  SCDs   CLINICALLY SIGNIFICANT STUDIES Basic Metabolic Panel:   Recent Labs Lab 10/05/12 1550  NA 142  K 3.7  CL 101  CO2 31  GLUCOSE 174*  BUN 17  CREATININE 1.26  CALCIUM 9.8   Liver Function Tests: No results found for this basename: AST, ALT, ALKPHOS, BILITOT, PROT, ALBUMIN,  in the last 168 hours CBC:   Recent Labs Lab 10/05/12 1550 10/06/12 0640  WBC 9.9 9.5  NEUTROABS  --  6.2  HGB 13.0 12.6*  HCT 40.6 40.3  MCV 86.0 86.1  PLT 186 207   Coagulation: No results found for this basename: LABPROT, INR,  in the last 168 hours Cardiac Enzymes: No results found for this basename: CKTOTAL, CKMB, CKMBINDEX, TROPONINI,  in the last 168 hours Urinalysis: No results found for this basename: COLORURINE, APPERANCEUR, LABSPEC, PHURINE, GLUCOSEU, HGBUR, BILIRUBINUR, KETONESUR, PROTEINUR, UROBILINOGEN, NITRITE, LEUKOCYTESUR,  in the last 168 hours Lipid Panel  Component Value Date/Time   CHOL 125 10/01/2012 0325   TRIG 46 10/01/2012 0325   HDL 51 10/01/2012 0325   CHOLHDL 2.5 10/01/2012 0325   VLDL 9 10/01/2012 0325   LDLCALC 65 10/01/2012 0325   HgbA1C  Lab Results  Component Value Date   HGBA1C 8.0* 10/01/2012   Urine Drug Screen:   No results found for this basename: labopia,  cocainscrnur,  labbenz,  amphetmu,  thcu,  labbarb    Alcohol Level: No results found for this basename: ETH,  in the last 168 hours  MRI of the brain   10/07/2012  Motion degraded exam.  In this patient who was recently noted to have a right inferior cerebellar and right posterior lateral medulla infarct,  there has been extension of the infarct with new small infarcts anterior right cerebellum and posterior right cerebellum.  No obvious intracranial hemorrhage (gradient sequence significantly motion degraded).   10/02/2012    Small acute non hemorrhagic infarcts inferior aspect of the right cerebellum and right posterior lateral aspect of the medulla.   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  Intracranial atherosclerotic type changes  2D Echocardiogram  EF 60-65% with no source of embolus.   Carotid Doppler  Bilateral: No evidence of hemodynamically significant internal carotid artery stenosis. Vertebral artery flow is antegrade. Right vertebral artery waveform is spiked with loss of diastolic component.  CXR  09/30/2012   Cardiomegaly with underlying chronic interstitial coarsening.   CXR  10/09/2012 Mild right lung base opacity appears similar to 10/05/2012; atelectasis (favored), versus mild infiltrate or aspiration pneumonitis. 10/06/12 Resolved right base air space disease. Cardiomegaly, without acute disease.  Therapy Recommendations CIR   Physical Exam  General - pleasant 79 year old male in no acute distress but with a very hoarse sounding voice. Heart - Regular rate and rhythm - no murmer Lungs - Clear to auscultation Skin - Warm and dr  Neurological Exam ; awake and alert oriented x3 with normal speech and language. Extraocular moments are full range without nystagmus. Vision acuity and fields appear normal. Fundi were not visualized. There is no facial weakness. There is   Dysarthria with weak cough and gag and poor palatal movements.. Tongue is midline. Motor system exam reveals no upper or lower extremity drift. Fine finger movements are diminished on the right. There is mild finger-to-nose dysmetria on the right. He orbits left over right approximately. No lower extremity weakness. Sensation appears normal. Gait was not tested.   ASSESSMENT Mr.  Joe Snow is a 79 y.o. male presenting with right arm and face weakness. Status post IV t-PA 09/30/2012 at 1630 at [facility], transferred to [facility]. tPA received 3h and 45 mins post symptom onset. 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 Stroke thromboembolic secondary to atrial fibrillation and right vertebral artery occlusion. Felt patient has looked physically better than his scan suggested. 10/05/2012 had increased difficulty swallowing. Imaging revealed extension of previous stroke as well as new right cerebellum and posterior right cerebellum infarcts, now requiring feeding tube.  Off warfarin since Dec 2013 for GIB. Now on Xarelto for secondary stroke prevention. Patient with resultant right arm hemiparesis, facial droop, dysarthria, dysphagia. Work up completed.     10/05/2012 with dysphagia worsening, found esophageal candidiasis per endoscopy Sat night performed by Dr. Leone Payor, fluconazole therapy ordered. Needs tx x 14 days, no food bolus seen. This finding likely incidental and not related  to stroke itself. GI agrees candidiasis is likely asymptomatic. GI signed off 10/08/2012. Hypertension - has been treated with intravenous Apresoline while n.p.o. Lisinopril resumed via  NG tube. Remains variable, though expected elevation just after new strokes, with last ones occurring Sat Diabetes, HgbA1c 8.0 Atrial fibrillation, taken off coumadin Dec 2013 due to GIB. Cardiology recommends resumption of coumadin. Patient does not want to go back on coumadin. Placed on NOAC  LDL 65- On Zocor Hx diverticulitis/GIB 06/2012 taken off coumadin at that time No Beta blockers per cardiology recommendations secondary to bradycardia and extreme fatique.  Hiccups- will start thorazine - had to use injection as tablet cannot be crushed. Did 12.5 mg q8. Thorazine may have worsened OSA  Respiratory difficulty during the night, stable now,  based on history sounds like OSA on top of known COPD, improved with O2. Will need sleep studies as an OP . Hospital day # 9  TREATMENT/PLAN  Use panda for tube feedings 7-10 days. ST to follow swallow. If swallowing does not improve at that time, will consider PEG.   Continue Xarelto for secondary stroke prevention for now.   Decrease thorazine dose to 6.25 IV q 8 h prn for hiccup management  Will need OP sleep study after rehab stay. Agree with plans for CPAP as needed for rehab  Discharge to CIR today   D/w patient, family and rehab team  Dr. Pearlean Brownie discussed diagnosis, prognosis and plan of care with wife, 2 daughters and rehab admissions coordinator.    Annie Main, MSN, RN, ANVP-BC, ANP-BC, [facility] Pager: [phone] 10/09/2012 10:31 AM  I have personally obtained a history, examined the patient, evaluated imaging results, and formulated the assessment and plan of care. I agree with the above.  Delia Heady, MD

## 2012-10-09 NOTE — Progress Notes (Signed)
ABG results paged to Dr. Amada Jupiter. Order received for patient to be placed on 02. He had been taking his Nasal Canula off. Patient breathing heavily now by mouth, so venti mask was placed 15LPM 50%. 02 sats continue to be at 100%. Dr. Amada Jupiter came to assess patient. Port Chest ordered as well.

## 2012-10-09 NOTE — Progress Notes (Signed)
Dr. Amada Jupiter called for patient's TF to be held at this time.

## 2012-10-09 NOTE — Progress Notes (Signed)
Was called earlier regarding shortness of breath. On exam, the patient awakened easily, mildly decreasd BS on right. CXR with no acute changes. ABG initially consistent with hypoxia and chronic retention, started on venti mask as he had not been tolerating nasal canula. I was concerned he might have had some aspiration and have held his tube feeds for the time being.   He is being anticoagulated for secondary stroke prevention, though my concern for PE is relatively low given repeat ABG showed improved oxygenation.   His repeat ABG shows improved oxygenation. I discussed his case with the on call pulmonologist and given that he intermittently is waking up gasping, concern that he might have a component of sleep apnea. Will try CPAP for now to see if it can make him more comfortable.

## 2012-10-09 NOTE — Progress Notes (Signed)
CRITICAL VALUE ALERT  Critical value received:  PC02 57.6  Date of notification:  10/09/12  Time of notification:  0500  Critical value read back:yes  Nurse who received alert:  Jacqulyn Ducking, RN  MD notified (1st page):  Dr. Amada Jupiter  Time of first page:  0502  MD notified (2nd page):  Time of second page:  Responding MD:  Dr. Amada Jupiter  Time MD responded: [phone]

## 2012-10-09 NOTE — H&P (Signed)
Physical Medicine and Rehabilitation Admission H&P  No chief complaint on file.  :  HPI: Joe Snow is a 79 y.o. right-handed male with documented history of hypertension, diabetes mellitus and atrial fibrillation off Coumadin due to diverticulitis with GI bleeding December 2013. Patient admitted 09/30/2012 with right-sided weakness and facial droop. He was initially taken to [facility] with cranial CT scan showing no acute intracranial abnormality. He was transferred to [facility] for further management. Patient did receive TPA at [facility]. Neurology service followup with workup ongoing . MRI of the brain 3/4 showed Small acute non hemorrhagic infarcts inferior aspect of  the right cerebellum and right posterior lateral aspect of the  medulla.  Carotid Dopplers with no ICA stenosis. Cardiology followup for history of atrial fibrillation and and echocardiogram completed showing ejection fraction 65% and normal systolic function. Placed on Xarelto for atrial fibrillation as well as stroke prophylaxis. Patient noted on 10/07/2012 with some increased confusion as well his swallowing difficulties MRI of the brain completed showing very been extension of the prior infarct with new small infarcts anterior right cerebellum and posterior right cerebellum. He was advised to continue present regimen with Xarelto. Patient with intermittent bouts of shortness of breath chest x-ray with no acute changes. ABGs initially consistent with hypoxia and chronic retention. Discuss with pulmonary in regards to ABGs and given that he intermittently is waking up gasping, concerned that he might have a component of sleep apnea and placed on CPAP. Followup speech therapy and findings of severe oral pharyngeal dysphagia as well as dysarthria with swallow study completed advised patient to be n.p.o. with nasogastric tube placed. There was some concern of possible need for gastrostomy tube and  followup gastroenterology Dr. Leone Payor and continue to monitor. There was findings of esophageal candidiasis and placed on Diflucan x14 days . Occupational therapy evaluation completed 10/01/2012 with recommendations for physical medicine rehabilitation consult to consider inpatient rehabilitation services.  Review of Systems  Cardiovascular: Positive for palpitations.  Musculoskeletal: Positive for myalgias.  All other systems reviewed and are negative  Past Medical History   Diagnosis  Date   .  Hypertension associated with diabetes    .  Diabetes    .  Atrial fibrillation    .  Embolic stroke involving left vertebral artery  10/02/2012   .  Dysphagia, pharyngeal-neurogenic  10/05/2012    Past Surgical History   Procedure  Laterality  Date   .  Prostate surgery  N/A  1995     estimate   .  Tonsillectomy  Bilateral  1930s   .  Esophagogastroduodenoscopy  N/A  10/05/2012     Procedure: ESOPHAGOGASTRODUODENOSCOPY (EGD); Surgeon: Iva Boop, MD; Location: [facility] ENDOSCOPY; Service: Endoscopy; Laterality: N/A;    History reviewed. No pertinent family history.  Social History: reports that he quit smoking about 34 years ago. His smoking use included Cigarettes. He smoked 0.00 packs per day. He has never used smokeless tobacco. He reports that he drinks about 1.0 ounces of alcohol per week. He reports that he does not use illicit drugs.  Allergies:  Allergies   Allergen  Reactions   .  Metoprolol  Shortness Of Breath   .  Amlodipine  Other (See Comments)     Lethargy    Medications Prior to Admission   Medication  Sig  Dispense  Refill   .  insulin detemir (LEVEMIR) 100 UNIT/ML injection  Inject 20-25 Units into the skin 2 (two) times daily.     Marland Kitchen  levothyroxine (SYNTHROID, LEVOTHROID) 88 MCG tablet  Take 88 mcg by mouth daily.     Marland Kitchen  lovastatin (MEVACOR) 10 MG tablet  Take 10 mg by mouth daily.     .  montelukast (SINGULAIR) 10 MG tablet  Take 10 mg by mouth daily.     .  pioglitazone  (ACTOS) 30 MG tablet  Take 30 mg by mouth daily.     .  prednisoLONE acetate (PRED FORTE) 1 % ophthalmic suspension  Place 1 drop into the left eye daily as needed (flares).     .  sertraline (ZOLOFT) 25 MG tablet  Take 25 mg by mouth daily.     Marland Kitchen  trifluridine (VIROPTIC) 1 % ophthalmic solution  Place 1 drop into the left eye daily as needed (flares).      Home:  Home Living  Lives With: Spouse  Available Help at Discharge: Family;Available 24 hours/day  Type of Home: House  Home Access: Stairs to enter  [employer] of Steps: 4  Entrance Stairs-Rails: None  Home Layout: Two level  Alternate Level Stairs-Number of Steps: 20 (pt sleeps downstairs)  Bathroom Shower/Tub: Tub/shower unit;Curtain;Walk-in shower  Bathroom Toilet: Handicapped height  Bathroom Accessibility: Yes  How Accessible: Accessible via walker;Accessible via wheelchair  Home Adaptive Equipment: Hand-held shower hose;Shower chair with back;Grab bars in shower  Functional History:  Prior Function  Able to Take Stairs?: Yes  Driving: Yes  Vocation: Retired  Functional Status:  Mobility:  Bed Mobility  Bed Mobility: Supine to Sit  Supine to Sit: 4: Min guard;HOB elevated  Sitting - Scoot to [place] of Bed: 4: Min guard;With rail  Transfers  Transfers: Sit to Stand;Stand to Sit  Sit to Stand: 2: Max assist  Sit to Stand: Patient Percentage: 70%  Stand to Sit: 3: Mod assist  Stand to Sit: Patient Percentage: 80%  Stand Pivot Transfers: 1: +2 Total assist  Stand Pivot Transfers: Patient Percentage: 70%  Ambulation/Gait  Ambulation/Gait Assistance: 1: +2 Total assist (due to multiple lines and + R lateral lean)  Ambulation/Gait: Patient Percentage: 70%  Ambulation Distance (Feet): 60 Feet  Assistive device: Rolling walker  Ambulation/Gait Assistance Details: pt with improved coordination and motor planning however con't to push to the R despite max verbal and tactile cues to achieve midline position. pt  unable to self-correct.  Gait Pattern: Step-through pattern;Lateral trunk lean to right;Decreased weight shift to left  Gait velocity: decreased  General Gait Details: narrowed BOS  Stairs: No  Wheelchair Mobility  Wheelchair Mobility: No  ADL:  ADL  Eating/Feeding: NPO  Where Assessed - Eating/Feeding: Chair (uncontrolled Rt UE AROM)  Grooming: Brushing hair;Maximal assistance (assist for balance, used R hand effectively)  Where Assessed - Grooming: Supported standing  Upper Body Dressing: Minimal assistance  Where Assessed - Upper Body Dressing: Unsupported sitting  Lower Body Dressing: Minimal assistance (long sitting in bed with LOB x3 to the Right)  Where Assessed - Lower Body Dressing: Supine, head of bed flat  Toilet Transfer: +2 Total assistance  Toilet Transfer Method: Sit to stand  Toilet Transfer Equipment: Comfort height toilet;Grab bars  Equipment Used: Gait belt;Rolling walker  Transfers/Ambulation Related to ADLs: transferred only today with +2 total assist pt 70%, leans toward R  ADL Comments: Pt continues to use R UE as lead to suction mouth.  Cognition:  Cognition  Overall Cognitive Status: Appears within functional limits for tasks assessed  Arousal/Alertness: Awake/alert  Orientation Level: Oriented X4  Attention: Focused;Sustained;Selective;Alternating  Focused Attention: Appears  intact  Sustained Attention: Appears intact  Selective Attention: Appears intact  Alternating Attention: Impaired  Alternating Attention Impairment: Verbal complex;Functional complex  Memory: Impaired  Memory Impairment: Storage deficit;Decreased recall of new information  Awareness: Impaired  Awareness Impairment: Anticipatory impairment  Executive Function: Reasoning  Reasoning: Impaired  Reasoning Impairment: Verbal complex  Cognition  Overall Cognitive Status: Appears within functional limits for tasks assessed/performed  Area of Impairment: Safety/judgement;Awareness of  errors  Arousal/Alertness: Awake/alert  Orientation Level: Appears intact for tasks assessed  Behavior During Session: [facility] for tasks performed  Memory Deficits: pt states "left side is the strong side" when asked this session. Pt did make error once during session stating "right side"  Safety/Judgement: Decreased awareness of safety precautions;Impulsive  Awareness of Errors: Assistance required to identify errors made;Assistance required to correct errors made  Awareness of Errors - Other Comments: appears unaware of R side lean, mod assist to correct with verbal cues and physical assist.  Awareness of Deficits: pt unaware of leaning to the Right  Problem Solving: pt educated on the need for x2 assistance but reports being able to transfer independently  Cognition - Other Comments: inconsistantly remembering safety cues dispite near constant reinforcement.    Physical Exam:  Blood pressure 130/74, pulse 85, temperature 97.9 F (36.6 C), temperature source Oral, resp. rate 18, height 5\' 11"  (1.803 m), weight 89.6 kg (197 lb 8.5 oz), SpO2 97.00%.    Vitals reviewed.  Constitutional: He appears well-developed.  HENT:  Head: Normocephalic.  Eyes: EOM are normal.  Neck: Normal range of motion. Neck supple. No thyromegaly present.  Cardiovascular:  Cardiac rate controlled  Pulmonary/Chest: Breath sounds normal. No respiratory distress.  Abdominal: Soft. Bowel sounds are normal. He exhibits no distension.  Musculoskeletal: He exhibits no edema.  Neurological: He is alert.  Patient was awake but kept eyes closed. He was able to name person. Inconsistently followed commands. Able to move all 4's but not consistent enough to grade. He appeared to move all 4 agst gravity. Could not do FMT. Withdrew to pain on all 4's. Did not verbalize much for me    Skin: Skin is warm and dry  Results for orders placed during the hospital encounter of 09/30/12 (from the past 48 hour(s))   GLUCOSE, CAPILLARY  Status: Abnormal    Collection Time    10/07/12 11:37 AM   Result  Value  Range    Glucose-Capillary  124 (*)  70 - 99 mg/dL    Comment 1  Notify RN     Comment 2  Documented in Chart    GLUCOSE, CAPILLARY Status: Abnormal    Collection Time    10/07/12 4:38 PM   Result  Value  Range    Glucose-Capillary  123 (*)  70 - 99 mg/dL    Comment 1  Notify RN     Comment 2  Documented in Chart    GLUCOSE, CAPILLARY Status: Abnormal    Collection Time    10/07/12 9:32 PM   Result  Value  Range    Glucose-Capillary  138 (*)  70 - 99 mg/dL    Comment 1  Notify RN    GLUCOSE, CAPILLARY Status: Abnormal    Collection Time    10/08/12 7:03 AM   Result  Value  Range    Glucose-Capillary  126 (*)  70 - 99 mg/dL    Comment 1  Notify RN    GLUCOSE, CAPILLARY Status: Abnormal    Collection Time  10/08/12 11:41 AM   Result  Value  Range    Glucose-Capillary  133 (*)  70 - 99 mg/dL    Comment 1  Notify RN     Comment 2  Documented in Chart    GLUCOSE, CAPILLARY Status: Abnormal    Collection Time    10/08/12 5:09 PM   Result  Value  Range    Glucose-Capillary  178 (*)  70 - 99 mg/dL    Comment 1  Notify RN     Comment 2  Documented in Chart    GLUCOSE, CAPILLARY Status: Abnormal    Collection Time    10/08/12 10:34 PM   Result  Value  Range    Glucose-Capillary  178 (*)  70 - 99 mg/dL   BLOOD GAS, ARTERIAL Status: Abnormal    Collection Time    10/09/12 2:47 AM   Result  Value  Range    O2 Content  2.0     Delivery systems  NASAL CANNULA     pH, Arterial  7.347 (*)  7.350 - 7.450    pCO2 arterial  55.2 (*)  35.0 - 45.0 mmHg    pO2, Arterial  56.7 (*)  80.0 - 100.0 mmHg    Bicarbonate  29.5 (*)  20.0 - 24.0 mEq/L    TCO2  31.2  0 - 100 mmol/L    Acid-Base Excess  4.2 (*)  0.0 - 2.0 mmol/L    O2 Saturation  87.5     Patient temperature  98.6     Collection site  RIGHT RADIAL     Drawn by  [phone]     Sample type  ARTERIAL DRAW     Allens test (pass/fail)  PASS  PASS   BLOOD  GAS, ARTERIAL Status: Abnormal    Collection Time    10/09/12 4:50 AM   Result  Value  Range    FIO2  0.50     Delivery systems  VENTURI MASK     pH, Arterial  7.326 (*)  7.350 - 7.450    pCO2 arterial  57.6 (*)  35.0 - 45.0 mmHg    Comment:  CRITICAL RESULT CALLED TO, READ BACK BY AND VERIFIED WITH:     Cathie Beams, RN AT [phone], BY ROBERT WILSON RRT,RCP ON 10/09/2012    pO2, Arterial  153.0 (*)  80.0 - 100.0 mmHg    Bicarbonate  29.2 (*)  20.0 - 24.0 mEq/L    TCO2  31.0  0 - 100 mmol/L    Acid-Base Excess  3.7 (*)  0.0 - 2.0 mmol/L    O2 Saturation  99.9     Patient temperature  98.6     Collection site  RIGHT RADIAL     Sample type  ARTERIAL DRAW     Allens test (pass/fail)  PASS  PASS   GLUCOSE, CAPILLARY Status: Abnormal    Collection Time    10/09/12 6:40 AM   Result  Value  Range    Glucose-Capillary  195 (*)  70 - 99 mg/dL    Mr Brain Wo Contrast  10/07/2012 *RADIOLOGY REPORT* Clinical Data: Stroke last week treated with t-PA. Developed severe dysphagia over the last couple days. MRI HEAD WITHOUT CONTRAST Technique: Multiplanar, multiecho pulse sequences of the brain and surrounding structures were obtained according to standard protocol without intravenous contrast. Comparison: 10/01/2012. Findings: Motion degraded exam. In this patient who was recently noted to have a right inferior cerebellar and right posterior lateral  medulla infarct, there has been extension of the infarct with new small infarcts anterior right cerebellum and posterior right cerebellum. No obvious intracranial hemorrhage (gradient sequence significantly motion degraded). Prominent small vessel disease type changes. Global atrophy. No intracranial mass lesion detected on this unenhanced exam. Transverse ligament hypertrophy. Cervical spondylotic changes with spinal stenosis and slight cord flattening C2-3 and C3-4. IMPRESSION: Motion degraded exam. In this patient who was recently noted to have a right inferior  cerebellar and right posterior lateral medulla infarct, there has been extension of the infarct with new small infarcts anterior right cerebellum and posterior right cerebellum. No obvious intracranial hemorrhage (gradient sequence significantly motion degraded). This has been made a PRA call report utilizing dashboard call feature. Original Report Authenticated By: Lacy Duverney, M.D.  Dg Chest Port 1 View  10/09/2012 *RADIOLOGY REPORT* Clinical Data: Shortness of breath PORTABLE CHEST - 1 VIEW Comparison: 09/30/2012, 10/05/2012, 10/06/2012 Findings: Heart size mildly enlarged. Aortic atherosclerosis. Mild central vascular prominence. Mild right lung base opacity. Trace right effusion not excluded. Mild elevation of the right hemidiaphragm. Enteric tube descends below the level of the image. No acute osseous finding. No pneumothorax. IMPRESSION: Mild right lung base opacity appears similar to 10/05/2012; atelectasis (favored), versus mild infiltrate or aspiration pneumonitis. Original Report Authenticated By: Jearld Lesch, M.D.  Dg Abd Portable 1v  10/08/2012 *RADIOLOGY REPORT* Clinical Data: Re-check Panda placement PORTABLE ABDOMEN - 1 VIEW Comparison: 10/07/2012 Findings: Weighted feeding tube is looped in the proximal gastric body. Nonobstructive bowel gas pattern. Residual contrast in the colon. IMPRESSION: Weighted feeding tube is looped in the proximal gastric body. Original Report Authenticated By: Charline Bills, M.D.   Post Admission Physician Evaluation:  1. Functional deficits secondary to Thromboembolic inferior right cerebellum and right posterior lateral medullary infarcts 2. Patient is admitted to receive collaborative, interdisciplinary care between the physiatrist, rehab nursing staff, and therapy team. 3. Patient's level of medical complexity and substantial therapy needs in context of that medical necessity cannot be provided at a lesser intensity of care such as a SNF. 4. Patient  has experienced substantial functional loss from his/her baseline which was documented above under the "Functional History" and "Functional Status" headings. Judging by the patient's diagnosis, physical exam, and functional history, the patient has potential for functional progress which will result in measurable gains while on inpatient rehab. These gains will be of substantial and practical use upon discharge in facilitating mobility and self-care at the household level. 5. Physiatrist will provide 24 hour management of medical needs as well as oversight of the therapy plan/treatment and provide guidance as appropriate regarding the interaction of the two. 6. 24 hour rehab nursing will assist with bladder management, bowel management, safety, skin/wound care, disease management, medication administration, pain management and patient education and help integrate therapy concepts, techniques,education, etc. 7. PT will assess and treat for/with: Lower extremity strength, range of motion, stamina, balance, functional mobility, safety, adaptive techniques and equipment, NMR, education. Goals are: supervision to mod I. 8. OT will assess and treat for/with: ADL's, functional mobility, safety, upper extremity strength, adaptive techniques and equipment, ADL's, functional mobility, safety, upper extremity strength, adaptive techniques and equipment, NMR, education . Goals are: supervision to mod I. 9. SLP will assess and treat for/with: speech, cognition, safety, swallowing. Goals are: supervision to min assist. 10. Case Management and Social Worker will assess and treat for psychological issues and discharge planning. 11. Team conference will be held weekly to assess progress toward goals and to determine barriers to discharge.  12. Patient will receive at least 3 hours of therapy per day at least 5 days per week. 13. ELOS: 7-10 days Prognosis: excellent Medical Problem List and Plan:  1. Thromboembolic inferior  right cerebellum and right posterior lateral medullary infarct  2. DVT Prophylaxis/Anticoagulation: Xarelto 15 mg daily  3. Neuropsych: This patient is not today capable of making decisions on his/her own behalf.  4. Dysphagia. Patient remains n.p.o. with nasogastric tube feeds. Follow per speech therapy. Monitor for signs of aspiration   -reconsult GI depending upon whether he will need a G tube or not. 5. Atrial fibrillation. Continue Xarelto as advised. Cardiac rate control  6. Hypertension. Lisinopril 5 mg daily. Monitor with increased activity  7. Esophageal candidiasis. Continue Diflucan as advised  8. History of GI bleed/diverticulitis. Monitor hemoglobin and any signs of bleeding  9. Diabetes mellitus with peripheral neuropathy. Continue sliding scale insulin for now all maintained on nasogastric tube feeds  10. Pulmonary/likely sleep apnea. Check oxygen saturations every shift. Continue CPAP. Will need outpatient sleep study for long term planning. May need to be medicated to tolerate cpap mask.  Ranelle Oyster, MD, [place] [facility]  10/09/2012

## 2012-10-09 NOTE — Care Management Note (Signed)
    Page 1 of 1   10/09/2012     3:55:58 PM   CARE MANAGEMENT NOTE 10/09/2012  Patient:  Joe Snow, Joe Snow   Account Number:  [id number]  Date Initiated:  10/03/2012  Documentation initiated by:  Jacquelynn Cree  Subjective/Objective Assessment:   admitted with CVA, received tPA     Action/Plan:   PT/OT/ST evals-recommending inpatient rehab   Anticipated DC Date:  10/09/2012   Anticipated DC Plan:  IP REHAB FACILITY  In-house referral  Clinical Social Worker      DC Planning Services  CM consult      Choice offered to / List presented to:             Status of service:  Completed, signed off Medicare Important Message given?   (If response is "NO", the following Medicare IM given date fields will be blank) Date Medicare IM given:   Date Additional Medicare IM given:    Discharge Disposition:  IP REHAB FACILITY  Per UR Regulation:  Reviewed for med. necessity/level of care/duration of stay  If discussed at Long Length of Stay Meetings, dates discussed:   10/09/2011    Comments:

## 2012-10-09 NOTE — Progress Notes (Signed)
Patient resting comfortably on CPAP and tolerating well. Since placement a little before 0600, patient has not had any episodes of sitting up rapidly and gasping for air. Will pass to day shift.

## 2012-10-09 NOTE — Progress Notes (Signed)
SLP note  Deferred treatment today as patient with plans to d/c to CIR this pm. Discussed with patient and wife.  [name] MA, CCC-SLP [phone]

## 2012-10-09 NOTE — Progress Notes (Signed)
Physical Therapy Treatment Patient Details Name: Joe Snow MRN: [id number] DOB: [birth year] Today's Date: 10/09/2012 Time: 4098-1191 PT Time Calculation (min): 35 min  PT Assessment / Plan / Recommendation Comments on Treatment Session  Pt continues to be motivated for PT.     Follow Up Recommendations  CIR;Supervision/Assistance - 24 hour     Does the patient have the potential to tolerate intense rehabilitation     Barriers to Discharge        Equipment Recommendations  Rolling walker with 5" wheels    Recommendations for Other Services    Frequency Min 4X/week   Plan Frequency remains appropriate;Discharge plan needs to be updated    Precautions / Restrictions Precautions Precautions: Fall Restrictions Weight Bearing Restrictions: No   Pertinent Vitals/Pain RN indicated it was acceptable to work with pt on room air.  Pt SO2 within normal limits on room air during this session.  Oxygen mask replaced on pt end of session.    Mobility  Bed Mobility Bed Mobility: Not assessed Transfers Transfers: Sit to Stand;Stand to Sit Sit to Stand: 2: Max assist;With upper extremity assist;From chair/3-in-1 Stand to Sit: 3: Mod assist Stand to Sit: Patient Percentage: 80% Ambulation/Gait Ambulation/Gait Assistance: 1: +2 Total assist Ambulation/Gait: Patient Percentage: 70% Ambulation Distance (Feet): 60 Feet Assistive device: Rolling walker Ambulation/Gait Assistance Details: Two person assist for safety, left lateral wt shift while walking, & chair to follow.  Pt leans to right and has narrow BOS & requires max tactile cues to correct.  Cues for upright posture.  (A) in keeping RW close and with turning.     Gait Pattern: Step-through pattern;Lateral trunk lean to right;Decreased weight shift to left;Trunk flexed;Narrow base of support Gait velocity: decreased Stairs: No Wheelchair Mobility Wheelchair Mobility: No    Exercises     PT Diagnosis:    PT Problem List:    PT Treatment Interventions:     PT Goals Acute Rehab PT Goals Time For Goal Achievement: 10/15/12 Potential to Achieve Goals: Good Pt will go Supine/Side to Sit: with modified independence;with HOB 0 degrees Pt will go Sit to Supine/Side: with modified independence;with HOB 0 degrees Pt will go Sit to Stand: with supervision PT Goal: Sit to Stand - Progress: Progressing toward goal Pt will go Stand to Sit: with supervision;with upper extremity assist PT Goal: Stand to Sit - Progress: Progressing toward goal Pt will Transfer Bed to Chair/Chair to Bed: with min assist Pt will Ambulate: 16 - 50 feet;with min assist;with least restrictive assistive device PT Goal: Ambulate - Progress: Progressing toward goal Pt will Go Up / Down Stairs: 3-5 stairs;with least restrictive assistive device;with min assist  Visit Information  Last PT Received On: 10/09/12 Assistance Needed: +2    Subjective Data  Subjective: Pt sitting in chair agreeable to PT.  Daughters indicated he had not slept well & it was thought he had a bad reaction to medication.  They felt he was doing much better at the time of this session.   Cognition  Cognition Overall Cognitive Status: Appears within functional limits for tasks assessed/performed Area of Impairment: Safety/judgement;Awareness of errors Arousal/Alertness: Awake/alert Orientation Level: Appears intact for tasks assessed Behavior During Session: [facility] for tasks performed Safety/Judgement: Decreased awareness of safety precautions Awareness of Errors: Assistance required to identify errors made;Assistance required to correct errors made Awareness of Errors - Other Comments: appears unaware of R side lean, mod assist to correct with verbal cues and physical assist.    Balance  Static Standing  Balance Static Standing - Balance Support: Bilateral upper extremity supported Static Standing - Level of Assistance: 1: +2 Total assist Static Standing - Comment/# of  Minutes: 5 minutes.  Pt leans rt & anteriorly.  Able to reach outside of BOS with mod (A) for stability.  End of Session PT - End of Session Equipment Utilized During Treatment: Gait belt Activity Tolerance: Patient tolerated treatment well Patient left: in chair;with call bell/phone within reach;with family/visitor present Nurse Communication: Mobility status   GP     Enid Baas, SPTA 10/09/2012, 2:13 PM

## 2012-10-09 NOTE — Progress Notes (Signed)
Patient waking up suddenly gasping for air, very anxious and trying to catch breath on several occasions. With calming techniques, patient would lie and rest, but the same occurrence would happen 20-30 minutes later. O2 sats 100%. Pt has been doing this the past nights also, but this evening seemed more lethargic and less conversant. Not wanting to open eyes. Very weak. Dr. Amada Jupiter paged. Order received for blood gas. Will continue to monitor.

## 2012-10-09 NOTE — Progress Notes (Signed)
Rehab admissions - I have a rehab bed available today.  Wife and patient are in agreement to inpatient rehab.  Will admit to inpatient rehab today.  Call me for questions.  #[phone]

## 2012-10-10 ENCOUNTER — Inpatient Hospital Stay (HOSPITAL_COMMUNITY): Payer: Medicare Other | Admitting: Speech Pathology

## 2012-10-10 ENCOUNTER — Inpatient Hospital Stay (HOSPITAL_COMMUNITY): Payer: Medicare Other | Admitting: Occupational Therapy

## 2012-10-10 ENCOUNTER — Inpatient Hospital Stay (HOSPITAL_COMMUNITY): Payer: PRIVATE HEALTH INSURANCE | Admitting: Occupational Therapy

## 2012-10-10 ENCOUNTER — Inpatient Hospital Stay (HOSPITAL_COMMUNITY): Payer: Medicare Other

## 2012-10-10 ENCOUNTER — Inpatient Hospital Stay (HOSPITAL_COMMUNITY): Payer: Medicare Other | Admitting: Physical Therapy

## 2012-10-10 DIAGNOSIS — R1313 Dysphagia, pharyngeal phase: Secondary | ICD-10-CM

## 2012-10-10 DIAGNOSIS — J9600 Acute respiratory failure, unspecified whether with hypoxia or hypercapnia: Secondary | ICD-10-CM

## 2012-10-10 DIAGNOSIS — I639 Cerebral infarction, unspecified: Secondary | ICD-10-CM

## 2012-10-10 DIAGNOSIS — R1310 Dysphagia, unspecified: Secondary | ICD-10-CM

## 2012-10-10 DIAGNOSIS — I6330 Cerebral infarction due to thrombosis of unspecified cerebral artery: Secondary | ICD-10-CM

## 2012-10-10 DIAGNOSIS — I6350 Cerebral infarction due to unspecified occlusion or stenosis of unspecified cerebral artery: Secondary | ICD-10-CM

## 2012-10-10 LAB — GLUCOSE, CAPILLARY
Glucose-Capillary: 216 mg/dL — ABNORMAL HIGH (ref 70–99)
Glucose-Capillary: 165 mg/dL — ABNORMAL HIGH (ref 70–99)
Glucose-Capillary: 191 mg/dL — ABNORMAL HIGH (ref 70–99)

## 2012-10-10 LAB — CBC WITH DIFFERENTIAL/PLATELET
Basophils Absolute: 0 10*3/uL (ref 0.0–0.1)
Basophils Relative: 0 % (ref 0–1)
Hemoglobin: 12.4 g/dL — ABNORMAL LOW (ref 13.0–17.0)
MCHC: 30.9 g/dL (ref 30.0–36.0)
Monocytes Relative: 16 % — ABNORMAL HIGH (ref 3–12)
Neutro Abs: 4.9 10*3/uL (ref 1.7–7.7)
Neutrophils Relative %: 63 % (ref 43–77)

## 2012-10-10 LAB — COMPREHENSIVE METABOLIC PANEL
ALT: 60 U/L — ABNORMAL HIGH (ref 0–53)
AST: 66 U/L — ABNORMAL HIGH (ref 0–37)
Albumin: 3.3 g/dL — ABNORMAL LOW (ref 3.5–5.2)
Alkaline Phosphatase: 94 U/L (ref 39–117)
BUN: 18 mg/dL (ref 6–23)
Chloride: 100 mEq/L (ref 96–112)
Potassium: 3.4 mEq/L — ABNORMAL LOW (ref 3.5–5.1)
Sodium: 141 mEq/L (ref 135–145)
Total Bilirubin: 0.7 mg/dL (ref 0.3–1.2)

## 2012-10-10 MED ORDER — POTASSIUM CHLORIDE 20 MEQ/15ML (10%) PO LIQD
10.0000 meq | Freq: Two times a day (BID) | ORAL | Status: DC
  Filled 2012-10-10 (×2): qty 7.5

## 2012-10-10 MED ORDER — CALCIUM CARBONATE ANTACID 500 MG PO CHEW
1.0000 | CHEWABLE_TABLET | Freq: Two times a day (BID) | ORAL | Status: DC | PRN
  Filled 2012-10-10: qty 2

## 2012-10-10 MED ORDER — FREE WATER
200.0000 mL | Freq: Four times a day (QID) | Status: DC
  Administered 2012-10-10 (×2): 200 mL

## 2012-10-10 MED ORDER — ONDANSETRON HCL 4 MG/2ML IJ SOLN: 4.0000 mg | Freq: Four times a day (QID) | INTRAMUSCULAR | Status: DC | PRN

## 2012-10-10 MED ORDER — SIMVASTATIN 5 MG PO TABS
5.0000 mg | ORAL_TABLET | Freq: Every day | ORAL | Status: DC
  Filled 2012-10-10 (×2): qty 1

## 2012-10-10 MED ORDER — LISINOPRIL 5 MG PO TABS
5.0000 mg | ORAL_TABLET | Freq: Every day | ORAL | Status: DC
  Administered 2012-10-11: 5 mg via NASOGASTRIC
  Filled 2012-10-10 (×2): qty 1

## 2012-10-10 MED ORDER — TRIFLURIDINE 1 % OP SOLN: 1.0000 [drp] | Freq: Two times a day (BID) | OPHTHALMIC | Status: DC | PRN

## 2012-10-10 MED ORDER — ONDANSETRON HCL 4 MG PO TABS: 4.0000 mg | ORAL_TABLET | Freq: Four times a day (QID) | ORAL | Status: DC | PRN

## 2012-10-10 MED ORDER — POTASSIUM CHLORIDE 20 MEQ/15ML (10%) PO LIQD
10.0000 meq | Freq: Two times a day (BID) | ORAL | Status: DC
  Filled 2012-10-10 (×3): qty 7.5

## 2012-10-10 MED ORDER — SERTRALINE HCL 25 MG PO TABS
25.0000 mg | ORAL_TABLET | Freq: Every day | ORAL | Status: DC
  Administered 2012-10-10: 25 mg via NASOGASTRIC
  Filled 2012-10-10 (×3): qty 1

## 2012-10-10 MED ORDER — SENNOSIDES-DOCUSATE SODIUM 8.6-50 MG PO TABS: 1.0000 | ORAL_TABLET | Freq: Every evening | ORAL | Status: DC | PRN

## 2012-10-10 MED ORDER — POLYVINYL ALCOHOL 1.4 % OP SOLN: 2.0000 [drp] | OPHTHALMIC | Status: DC | PRN

## 2012-10-10 MED ORDER — GLUCERNA 1.2 CAL PO LIQD
1000.0000 mL | ORAL | Status: DC
  Administered 2012-10-10 – 2012-10-11 (×2): 1000 mL
  Filled 2012-10-10 (×6): qty 1000

## 2012-10-10 MED ORDER — POTASSIUM CHLORIDE CRYS ER 10 MEQ PO TBCR
10.0000 meq | EXTENDED_RELEASE_TABLET | Freq: Two times a day (BID) | ORAL | Status: DC
  Filled 2012-10-10 (×3): qty 1

## 2012-10-10 NOTE — Plan of Care (Signed)
Problem: RH BOWEL ELIMINATION Goal: RH STG MANAGE BOWEL W/EQUIPMENT W/ASSISTANCE STG Manage Bowel With Equipment With Assistance  Outcome: Progressing LBM 10-07-12 Goal: RH OTHER STG BOWEL ELIMINATION GOALS W/ASSIST Other STG Bowel Elimination Goals With Assistance.  Outcome: Progressing LBM 10-07-12

## 2012-10-10 NOTE — Progress Notes (Signed)
Patient noted with nasal gastric tube out at 1900. Medications noted given . Report given to [place], Charity fundraiser. Continue with plan of care .                                              Joe Snow

## 2012-10-10 NOTE — Progress Notes (Signed)
Occupational Therapy Session Note  Patient Details  Name: Joe Snow MRN: [id number] Date of Birth: [birth year]  Today's Date: 10/10/2012 Time: 1300-1330 Time Calculation (min): 30 min  Short Term Goals: Week 1:  OT Short Term Goal 1 (Week 1): Pt will complete bathing at min assist sit <> stand level OT Short Term Goal 2 (Week 1): Pt will complete UB dressing mod assist OT Short Term Goal 3 (Week 1): Pt will complete LB dressing max assist OT Short Term Goal 4 (Week 1): Pt will complete toilet transfer with mod assist  Skilled Therapeutic Interventions/Progress Updates:  Pt seen for 1:1 OT with focus on transfers, sitting balance, and functional use of RUE with grooming task.  Pt in bed upon arrival with son and daughter in law present.  Educated pt on pursed lip breathing and use of incentive spirometer to increase breathing/cardiorespiratory with activity.  Stand pivot transfer to recliner max assist.  Pt requested to shave, educated on safety due to pt on blood thinner so encouraged staff or family member to perform shaving.  However encouraged pt to complete rest of shaving task -- opening shaving cream, lathering cream on face, and washing face post shaving.  Pt's son reports pt had slight tremors in dominant RUE PTA.  Therapy Documentation Precautions:  Precautions Precautions: Fall Precaution Comments: decreased safety awareness, AFIB, DM, maintain Sp02  > 90% on 2 L 02 PRN, NPO with NG tube.   Restrictions Weight Bearing Restrictions: No Pain:  Pt with no c/o pain this session.  See FIM for current functional status  Therapy/Group: Individual Therapy  Leonette Monarch 10/10/2012, 2:31 PM

## 2012-10-10 NOTE — Care Management Note (Signed)
[facility] Individual Statement of Services  Patient Name:  Joe Snow  Date:  10/10/2012  Welcome to the [facility].  Our goal is to provide you with an individualized program based on your diagnosis and situation, designed to meet your specific needs.  With this comprehensive rehabilitation program, you will be expected to participate in at least 3 hours of rehabilitation therapies Monday-Friday, with modified therapy programming on the weekends.  Your rehabilitation program will include the following services:  Physical Therapy (PT), Occupational Therapy (OT), Speech Therapy (ST), 24 hour per day rehabilitation nursing, Case Management ( Social Worker), Rehabilitation Medicine, Nutrition Services and Pharmacy Services  Weekly team conferences will be held on Wednesday to discuss your progress.  Your  Social Worker will talk with you frequently to get your input and to update you on team discussions.  Team conferences with you and your family in attendance may also be held.  Expected length of stay: 2-3 weeks Overall anticipated outcome: Supervision with cues  Depending on your progress and recovery, your program may change.  Your  Social Worker will coordinate services and will keep you informed of any changes.  Your  SW name and contact number are listed  below.  The following services may also be recommended but are not provided by the [facility]:   Driving Evaluations  Home Health Rehabiltiation Services  Outpatient Rehabilitatation Servives    Arrangements will be made to provide these services after discharge if needed.  Arrangements include referral to agencies that provide these services.  Your insurance has been verified to be:  Medicare & AARP Your primary doctor is:  Dr. Sherrie Mustache  Pertinent information will be shared with your doctor and your insurance company.   Social Worker:  Dossie Der, [place]  [phone]  Information discussed with and copy given to patient by: Lucy Chris, 10/10/2012, 11:17 AM

## 2012-10-10 NOTE — Consult Note (Signed)
PULMONARY  / CRITICAL CARE MEDICINE  Name: Joe Snow MRN: [id number] DOB: [birth year]    ADMISSION DATE:  10/09/2012 CONSULTATION DATE:  10/11/12  REFERRING MD :  Rehab MD.  CHIEF COMPLAINT:  Respiratory failure.  BRIEF PATIENT DESCRIPTION: 79 year old with multiple previous stroke presenting to PCCM with multiple and frequent episodes of sporadic respiratory distress with choking more frequently at night.  But now is more present during the day as well.  Resolves with deep coughing.  LINES / TUBES: PIV  CULTURES: None  ANTIBIOTICS: None  PAST MEDICAL HISTORY :  Past Medical History  Diagnosis Date  . Hypertension associated with diabetes   . Diabetes   . Atrial fibrillation   . Embolic stroke involving left vertebral artery 10/02/2012  . Dysphagia, pharyngeal-neurogenic 10/05/2012   Past Surgical History  Procedure Laterality Date  . Prostate surgery N/A 1995    estimate  . Tonsillectomy Bilateral 1930s  . Esophagogastroduodenoscopy N/A 10/05/2012    Procedure: ESOPHAGOGASTRODUODENOSCOPY (EGD);  Surgeon: Iva Boop, MD;  Location: [facility] ENDOSCOPY;  Service: Endoscopy;  Laterality: N/A;   Prior to Admission medications   Medication Sig Start Date End Date Taking? Authorizing Provider  feeding supplement (GLUCERNA SHAKE) LIQD Take 237 mLs by mouth 2 (two) times daily between meals. 10/04/12   Layne Benton, NP  insulin detemir (LEVEMIR) 100 UNIT/ML injection Inject 20-25 Units into the skin 2 (two) times daily.    Historical Provider, MD  levothyroxine (SYNTHROID, LEVOTHROID) 88 MCG tablet Take 88 mcg by mouth daily.    Historical Provider, MD  lisinopril (PRINIVIL,ZESTRIL) 5 MG tablet Take 1 tablet (5 mg total) by mouth daily. 10/04/12   Layne Benton, NP  lovastatin (MEVACOR) 10 MG tablet Take 10 mg by mouth daily.    Historical Provider, MD  montelukast (SINGULAIR) 10 MG tablet Take 10 mg by mouth daily.    Historical Provider, MD  pioglitazone (ACTOS) 30 MG tablet Take 30  mg by mouth daily.    Historical Provider, MD  prednisoLONE acetate (PRED FORTE) 1 % ophthalmic suspension Place 1 drop into the left eye daily as needed (flares).    Historical Provider, MD  Rivaroxaban (XARELTO) 15 MG TABS tablet Take 1 tablet (15 mg total) by mouth daily with supper. 10/04/12   Layne Benton, NP  sertraline (ZOLOFT) 25 MG tablet Take 25 mg by mouth daily.    Historical Provider, MD  trifluridine (VIROPTIC) 1 % ophthalmic solution Place 1 drop into the left eye daily as needed (flares).    Historical Provider, MD   Allergies  Allergen Reactions  . Metoprolol Shortness Of Breath  . Amlodipine Other (See Comments)    Lethargy     FAMILY HISTORY:  No family history on file. SOCIAL HISTORY:  reports that he quit smoking about 34 years ago. His smoking use included Cigarettes. He smoked 0.00 packs per day. He has never used smokeless tobacco. He reports that he drinks about 1.0 ounces of alcohol per week. He reports that he does not use illicit drugs.  REVIEW OF SYSTEMS:   Patient unable to articulate symptoms well but 12 point is negative other than mentioned above.  SUBJECTIVE:   VITAL SIGNS: Temp:  [97 F (36.1 C)-97.3 F (36.3 C)] 97 F (36.1 C) (03/13 1500) Pulse Rate:  [80-85] 84 (03/13 1500) Resp:  [18-22] 22 (03/13 1500) BP: (148-172)/(60-91) 172/91 mmHg (03/13 1500) SpO2:  [95 %-98 %] 98 % (03/13 1500)  PHYSICAL EXAMINATION: General:  Chronically  ill appearing male, hoarse and unable to verbalize. Neuro:  Alert and interactive, moving all ext to command, clearly unable to protect airway, minimal gag. HEENT:  [place]/AT, PERRL, EOM-I and MMM. Neck:  Supple, -LAN and -thyromegally. Cardiovascular:  RRR, Nl S1/S2, -M/R/G. Lungs:  Coarse BS diffusely, more pronounced gurgling over the upper airway. Abdomen:  Soft, NT, ND and +BS. Musculoskeletal:  -edema and -tenderness. Skin:  Intact with multiple bruising.  Recent Labs Lab 10/05/12 1550 10/10/12 0712  NA  142 141  K 3.7 3.4*  CL 101 100  CO2 31 32  BUN 17 18  CREATININE 1.26 1.24  GLUCOSE 174* 231*   Recent Labs Lab 10/05/12 1550 10/06/12 0640 10/10/12 0712  HGB 13.0 12.6* 12.4*  HCT 40.6 40.3 40.1  WBC 9.9 9.5 7.8  PLT 186 207 176   Dg Chest Port 1 View  10/09/2012  *RADIOLOGY REPORT*  Clinical Data: Shortness of breath  PORTABLE CHEST - 1 VIEW  Comparison: 09/30/2012, 10/05/2012, 10/06/2012  Findings: Heart size mildly enlarged.  Aortic atherosclerosis. Mild central vascular prominence.  Mild right lung base opacity. Trace right effusion not excluded.  Mild elevation of the right hemidiaphragm.  Enteric tube descends below the level of the image. No acute osseous finding.  No  pneumothorax.  IMPRESSION: Mild right lung base opacity appears similar to 10/05/2012; atelectasis (favored), versus mild infiltrate or aspiration pneumonitis.   Original Report Authenticated By: Jearld Lesch, M.D.    ASSESSMENT / PLAN:  79 year old with multiple strokes, clearly unable to protect his airway.  From a pulmonary standpoint options are unfortunately limited.  I spent quite a bit of time speaking with the patient's son about options.  Options are to intubate and trach vs frequent NTS and comfort when respiratory failure becomes an issue.  Spoke with NP for rehab, recommend d/c of CPAP at this point since patient can not protect his airway and perform NTS as needed until patient's wife and other children are here for a more in depth discussion since the patient's son believes that speaking with the patient without the support of his wife bedside would be too upsetting for him.  Wife is to arrive tomorrow, rehab MD can speak with family or if need be please feel free to call me and I will be happy to be there or lead the discussion since tracheostomy comes with significant changes in life style that I truly would like to make sure the patient and family are aware of given age and over all health such as  nursing home placement and inability to speak and the need for PEG tube as well which are all major life altering decision prior to intubating and performing a trach.  Alyson Reedy, M.D. [facility] Pulmonary/Critical Care Medicine. Pager: [phone]. After hours pager: [phone].

## 2012-10-10 NOTE — Progress Notes (Signed)
Patient ID: Joe Snow, male   DOB: 10/31/1923, 79 y.o.   MRN: [id number] Subjective/Complaints: 79 y.o. right-handed male with documented history of hypertension, diabetes mellitus and atrial fibrillation off Coumadin due to diverticulitis with GI bleeding December 2013. Patient admitted 09/30/2012 with right-sided weakness and facial droop. He was initially taken to [facility] with cranial CT scan showing no acute intracranial abnormality. He was transferred to [facility] for further management. Patient did receive TPA at [facility]. Neurology service followup with workup ongoing . MRI of the brain 3/4 showed Small acute non hemorrhagic infarcts inferior aspect of  the right cerebellum and right posterior lateral aspect of the  medulla.  Carotid Dopplers with no ICA stenosis. Cardiology followup for history of atrial fibrillation and and echocardiogram completed showing ejection fraction 65% and normal systolic function. Placed on Xarelto for atrial fibrillation as well as stroke prophylaxis. Patient noted on 10/07/2012 with some increased confusion as well his swallowing difficulties MRI of the brain completed showing very been extension of the prior infarct with new small infarcts anterior right cerebellum and posterior right cerebellum. He was advised to continue present regimen with Xarelto. Patient with intermittent bouts of shortness of breath chest x-ray with no acute changes. ABGs initially consistent with hypoxia and chronic retention. Discuss with pulmonary in regards to ABGs and given that he intermittently is waking up gasping, concerned that he might have a component of sleep apnea and placed on CPAP. Followup speech therapy and findings of severe oral pharyngeal dysphagia as well as dysarthria with swallow study completed advised patient to be n.p.o. with nasogastric tube placed. There was some concern of possible need for gastrostomy tube and followup  gastroenterology Dr. Leone Payor and continue to monitor. There was findings of esophageal candidiasis and placed on Diflucan x14 days   Patient with episodes of gasping for air. He leans forward. He did suction himself for a moderate amount of saliva. This episode lasted about 1 minute. Voice is wet and hoarse Objective: Vital Signs: Blood pressure 148/60, pulse 80, temperature 97.3 F (36.3 C), temperature source Oral, resp. rate 18, height 5' 11.5" (1.816 m), weight 89.54 kg (197 lb 6.4 oz), SpO2 95.00%. Dg Chest Port 1 View  10/09/2012  *RADIOLOGY REPORT*  Clinical Data: Shortness of breath  PORTABLE CHEST - 1 VIEW  Comparison: 09/30/2012, 10/05/2012, 10/06/2012  Findings: Heart size mildly enlarged.  Aortic atherosclerosis. Mild central vascular prominence.  Mild right lung base opacity. Trace right effusion not excluded.  Mild elevation of the right hemidiaphragm.  Enteric tube descends below the level of the image. No acute osseous finding.  No  pneumothorax.  IMPRESSION: Mild right lung base opacity appears similar to 10/05/2012; atelectasis (favored), versus mild infiltrate or aspiration pneumonitis.   Original Report Authenticated By: Jearld Lesch, M.D.    Results for orders placed during the hospital encounter of 10/09/12 (from the past 72 hour(s))  GLUCOSE, CAPILLARY     Status: Abnormal   Collection Time    10/09/12  6:36 PM      Result Value Range   Glucose-Capillary 237 (*) 70 - 99 mg/dL   Comment 1 Notify RN    GLUCOSE, CAPILLARY     Status: Abnormal   Collection Time    10/10/12 12:30 AM      Result Value Range   Glucose-Capillary 216 (*) 70 - 99 mg/dL  GLUCOSE, CAPILLARY     Status: Abnormal   Collection Time    10/10/12  6:12  AM      Result Value Range   Glucose-Capillary 196 (*) 70 - 99 mg/dL  CBC WITH DIFFERENTIAL     Status: Abnormal   Collection Time    10/10/12  7:12 AM      Result Value Range   WBC 7.8  4.0 - 10.5 K/uL   RBC 4.64  4.22 - 5.81 MIL/uL    Hemoglobin 12.4 (*) 13.0 - 17.0 g/dL   HCT 78.2  95.6 - 21.3 %   MCV 86.4  78.0 - 100.0 fL   MCH 26.7  26.0 - 34.0 pg   MCHC 30.9  30.0 - 36.0 g/dL   RDW 08.6  57.8 - 46.9 %   Platelets 176  150 - 400 K/uL   Neutrophils Relative 63  43 - 77 %   Neutro Abs 4.9  1.7 - 7.7 K/uL   Lymphocytes Relative 18  12 - 46 %   Lymphs Abs 1.4  0.7 - 4.0 K/uL   Monocytes Relative 16 (*) 3 - 12 %   Monocytes Absolute 1.2 (*) 0.1 - 1.0 K/uL   Eosinophils Relative 3  0 - 5 %   Eosinophils Absolute 0.2  0.0 - 0.7 K/uL   Basophils Relative 0  0 - 1 %   Basophils Absolute 0.0  0.0 - 0.1 K/uL  COMPREHENSIVE METABOLIC PANEL     Status: Abnormal   Collection Time    10/10/12  7:12 AM      Result Value Range   Sodium 141  135 - 145 mEq/L   Potassium 3.4 (*) 3.5 - 5.1 mEq/L   Chloride 100  96 - 112 mEq/L   CO2 32  19 - 32 mEq/L   Glucose, Bld 231 (*) 70 - 99 mg/dL   BUN 18  6 - 23 mg/dL   Creatinine, Ser 6.29  0.50 - 1.35 mg/dL   Calcium 9.0  8.4 - 52.8 mg/dL   Total Protein 6.7  6.0 - 8.3 g/dL   Albumin 3.3 (*) 3.5 - 5.2 g/dL   AST 66 (*) 0 - 37 U/L   ALT 60 (*) 0 - 53 U/L   Alkaline Phosphatase 94  39 - 117 U/L   Total Bilirubin 0.7  0.3 - 1.2 mg/dL   GFR calc non Af Amer 51 (*) >90 mL/min   GFR calc Af Amer 59 (*) >90 mL/min   Comment:            The eGFR has been calculated     using the CKD EPI equation.     This calculation has not been     validated in all clinical     situations.     eGFR's persistently     <90 mL/min signify     possible Chronic Kidney Disease.  GLUCOSE, CAPILLARY     Status: Abnormal   Collection Time    10/10/12 11:55 AM      Result Value Range   Glucose-Capillary 165 (*) 70 - 99 mg/dL   Comment 1 Notify RN       HEENT: vocal hoarseness Heart regular rate and rhythm no murmurs Lungs have coarse wet upper airway sounds. No wheezing. No rales appreciated Abdomen positive bowel sounds soft nontender palpation Extremities no clubbing cyanosis or edema Neuro:  Sensation intact to light touch in the facial area as well as the limbs. Motor strength is 5/5 in bilateral deltoid, biceps, triceps, grip, hip flexor, knee extensors, ankle dorsiflexor plantar flexor Right upper  extremity has moderate ataxia on finger nose to finger testing. Right lower extremity is moderate ataxia with heel-to-shin testing. Sitting balance is fair Tongue is midline  Assessment/Plan: 1. Functional deficits secondary to Right posterior inferior cerebellar artery infarct which require 3+ hours per day of interdisciplinary therapy in a comprehensive inpatient rehab setting. Physiatrist is providing close team supervision and 24 hour management of active medical problems listed below. Physiatrist and rehab team continue to assess barriers to discharge/monitor patient progress toward functional and medical goals. FIM: FIM - Bathing Bathing Steps Patient Completed: Chest;Right Arm;Left Arm;Abdomen;Front perineal area;Right upper leg;Left upper leg Bathing: 3: Mod-Patient completes 5-7 76f 10 parts or 50-74%  FIM - Upper Body Dressing/Undressing Upper body dressing/undressing: 0: Wears gown/pajamas-no public clothing FIM - Lower Body Dressing/Undressing Lower body dressing/undressing: 0: Wears Oceanographer        FIM - Banker Devices: Bed rails;Arm rests Bed/Chair Transfer: 3: Supine > Sit: Mod A (lifting assist/Pt. 50-74%/lift 2 legs;2: Bed > Chair or W/C: Max A (lift and lower assist)  FIM - Locomotion: Wheelchair Locomotion: Wheelchair: 1: Total Assistance/staff pushes wheelchair (Pt<25%) FIM - Locomotion: Ambulation Ambulation/Gait Assistance: 1: +2 Total assist Locomotion: Ambulation: 1: Two helpers  Comprehension Comprehension Mode: Auditory Comprehension: 5-Follows basic conversation/direction: With extra time/assistive device  Expression Expression Mode: Verbal Expression: 5-Expresses basic  needs/ideas: With extra time/assistive device  Social Interaction Social Interaction: 5-Interacts appropriately 90% of the time - Needs monitoring or encouragement for participation or interaction.       Medical Problem List and Plan:  1. Thromboembolic inferior right cerebellum and right posterior lateral medullary infarct  2. DVT Prophylaxis/Anticoagulation: Xarelto 15 mg daily  3. Neuropsych: This patient is not today capable of making decisions on his/her own behalf.  4. Dysphagia. Patient remains n.p.o. with nasogastric tube feeds. Follow per speech therapy. Monitor for signs of aspiration  -reconsult GI depending upon whether he will need a G tube or not.  5. Atrial fibrillation. Continue Xarelto as advised. Cardiac rate control  6. Hypertension. Lisinopril 5 mg daily. Monitor with increased activity  7. Esophageal candidiasis. Continue Diflucan as advised  8. History of GI bleed/diverticulitis. Monitor hemoglobin and any signs of bleeding  9. Diabetes mellitus with peripheral neuropathy. Continue sliding scale insulin for now all maintained on nasogastric tube feeds  10. Pulmonary/likely sleep apnea. Check oxygen saturations every shift. Continue CPAP. Will need outpatient sleep study for long term planning. May need to be medicated to tolerate cpap mask. Pulmonologist to evaluate episodes of shortness of breath. Has been seen by cardiology already. I am questioning whether this may be related to pooling of secretions, speech therapy is reportedly concerned about possible vocal cord spasms and is planning to evaluate with FEES This patient is certainly a high risk for pneumonia   LOS (Days) 1 A FACE TO FACE EVALUATION WAS PERFORMED  KIRSTEINS,ANDREW E 10/10/2012, 3:19 PM

## 2012-10-10 NOTE — Evaluation (Signed)
Physical Therapy Assessment and Plan  Patient Details  Name: Joe Snow MRN: [id number] Date of Birth: 01/23/1924  PT Diagnosis: Abnormality of gait, Ataxia, Ataxic gait, Coordination disorder, Difficulty walking and Hemiplegia dominant Rehab Potential: Good ELOS: 2.5-3 weeks   Today's Date: 10/10/2012 Time: 4696-2952 Time Calculation (min): 60 min  Problem List:  Patient Active Problem List  Diagnosis  . Embolic stroke involving left vertebral artery  . Chronic a-fib  . Diabetes  . Essential hypertension, benign  . Shortness of breath  . Dysphagia, pharyngeal-neurogenic  . Esophageal candidiasis  . Hiccups  . COPD exacerbation  . OSA on CPAP  . CVA (cerebral infarction)    Past Medical History:  Past Medical History  Diagnosis Date  . Hypertension associated with diabetes   . Diabetes   . Atrial fibrillation   . Embolic stroke involving left vertebral artery 10/02/2012  . Dysphagia, pharyngeal-neurogenic 10/05/2012   Past Surgical History:  Past Surgical History  Procedure Laterality Date  . Prostate surgery N/A 1995    estimate  . Tonsillectomy Bilateral 1930s  . Esophagogastroduodenoscopy N/A 10/05/2012    Procedure: ESOPHAGOGASTRODUODENOSCOPY (EGD);  Surgeon: Iva Boop, MD;  Location: [facility] ENDOSCOPY;  Service: Endoscopy;  Laterality: N/A;    Assessment & Plan Clinical Impression: Patient is a 79 y.o. right-handed male with documented history of hypertension, diabetes mellitus and atrial fibrillation off Coumadin due to diverticulitis with GI bleeding December 2013. Patient admitted 09/30/2012 with right-sided weakness and facial droop. He was initially taken to [facility] with cranial CT scan showing no acute intracranial abnormality. He was transferred to [facility] for further management. Patient did receive TPA at [facility]. Neurology service followup with workup ongoing . MRI of the brain 10/01/12 showed small acute non  hemorrhagic infarcts inferior aspect of the right cerebellum and right posterior lateral aspect of the medulla. Carotid Dopplers with no ICA stenosis.   Cardiology followup for history of atrial fibrillation and and echocardiogram completed showing ejection fraction 65% and normal systolic function. Placed on Xarelto for atrial fibrillation as well as stroke prophylaxis. Patient noted on 10/07/2012 with some increased confusion as well his swallowing difficulties MRI of the brain completed showing  extension of the prior infarct with new small infarcts anterior right cerebellum and posterior right cerebellum. He was advised to continue present regimen with Xarelto.   Patient with intermittent bouts of shortness of breath chest x-ray with no acute changes. ABGs initially consistent with hypoxia and chronic retention. Discuss with pulmonary in regards to ABGs and given that he intermittently is waking up gasping, concerned that he might have a component of sleep apnea and placed on CPAP.   Followup speech therapy and findings of severe oral pharyngeal dysphagia as well as dysarthria with swallow study completed advised patient to be n.p.o. with nasogastric tube placed. There was some concern of possible need for gastrostomy tube and followup gastroenterology Dr. Leone Payor and continue to monitor. There was findings of esophageal candidiasis and placed on Diflucan x 14 days.  Patient transferred to CIR on 10/09/2012 .   Patient currently requires total with mobility secondary to muscle weakness, decreased cardiorespiratoy endurance and decreased oxygen support, impaired timing and sequencing, unbalanced muscle activation, ataxia, decreased coordination and decreased motor planning and decreased standing balance, decreased postural control, hemiplegia and decreased balance strategies.  Prior to hospitalization, patient was independent  with mobility and lived with Spouse in a House home.  Home access is 4Stairs to  enter.  Patient  will benefit from skilled PT intervention to maximize safe functional mobility, minimize fall risk and decrease caregiver burden for planned discharge home with 24 hour supervision.  Anticipate patient will benefit from follow up HH at discharge.  PT - End of Session Activity Tolerance: Decreased this session Endurance Deficit: Yes Endurance Deficit Description: Sedentary, loss of strength and energy PTA; difficulty breathing PT Assessment Rehab Potential: Good Barriers to Discharge: Inaccessible home environment Barriers to Discharge Comments: No rails for stairs to enter home; unable to install rails secondary to home being historic.   PT Plan PT Intensity: Minimum of 1-2 x/day ,45 to 90 minutes PT Frequency: 5 out of 7 days PT Duration Estimated Length of Stay: 2.5-3 weeks PT Treatment/Interventions: Ambulation/gait training;Balance/vestibular training;Community reintegration;Discharge planning;DME/adaptive equipment instruction;Functional mobility training;Neuromuscular re-education;Patient/family education;Stair training;Therapeutic Activities;Therapeutic Exercise;UE/LE Strength taining/ROM;UE/LE Coordination activities;Wheelchair propulsion/positioning PT Recommendation Recommendations for Other Services: Neuropsych consult (for depressed mood PTA) Follow Up Recommendations: Home health PT;24 hour supervision/assistance Patient destination: Home Equipment Recommended: Cane;Rolling walker with 5" wheels  Skilled Therapeutic Intervention Patient reporting difficulty with exhalation and often feels SOB, anxious.  RN reports patient retaining C02.  Educated patient on pursed lip breathing sitting in recliner with use of paper towel and visual demonstration for prolonged pursed lip breathing for increased ventilation, perfusion and removal of C02.  At end of session educated patient and wife on ELOS and overall goals for rehab.  Patient and wife verbalized  understanding.  PT Evaluation Precautions/Restrictions Precautions Precautions: Fall Precaution Comments: decreased safety awareness General Chart Reviewed: Yes  Vital SignsTherapy Vitals Pulse Rate: 80 BP: 148/60 mmHg Patient Position, if appropriate: Sitting Oxygen Therapy SpO2: 95 % O2 Device: None (Room air) Pain Pain Assessment Pain Assessment: No/denies pain Home Living/Prior Functioning Home Living Lives With: Spouse Available Help at Discharge: Family (patient has 3 children that can also assist) Type of Home: House Home Access: Stairs to enter [employer] of Steps: 4 Entrance Stairs-Rails: None Home Layout: Two level;Able to live on main level with bedroom/bathroom Alternate Level Stairs-Number of Steps: 20; 15 to landing + 5 more Alternate Level Stairs-Rails: Left Bathroom Shower/Tub: Health visitor: Handicapped height Bathroom Accessibility: Yes How Accessible: Accessible via wheelchair;Accessible via walker Home Adaptive Equipment: None Additional Comments: Home remodeled Prior Function Level of Independence: Independent with gait;Independent with transfers Able to Take Stairs?: Yes Driving: Yes Vocation: Part time employment Vocation Requirements: Owns a Veterinary surgeon; worked part time at [employer] Comments: Patient's wife reports PTA patietn was on a medication that was making him feel down, was not working, spending a lot of time at home, sitting, low energy Vision/Perception  Perception Perception: Within Functional Limits Praxis Praxis: Intact  Cognition Behaviors: Restless Sensation Sensation Light Touch: Appears Intact Stereognosis: Not tested Hot/Cold: Not tested Proprioception: Appears Intact Coordination Gross Motor Movements are Fluid and Coordinated: No Coordination and Movement Description: Mild RLE impaired coordination, ataxia and dysmetria Motor  Motor Motor: Hemiplegia;Ataxia;Motor impersistence   Mobility Transfers Stand Pivot Transfers: 2: Max assist Stand Pivot Transfer Details (indicate cue type and reason): Stand pivot with max A >> +2 A bilat HHA with verbal cues and manual facilitation for lateral weight shifting and advancement and placement of RLE; also required lifting and controlled lowering assistance Locomotion  Ambulation Ambulation: Yes Ambulation/Gait Assistance: 1: +2 Total assist Ambulation Distance (Feet): 50 Feet Assistive device: 2 person hand held assist Ambulation/Gait Assistance Details: Performed gait in controlled environment with +2 HHA; patient presents with R lateral lean, decreased R step length, ataxic  gait and poor motor control/coordination RLE  Gait Gait: Yes Gait Pattern: Impaired Gait Pattern: Step-to pattern;Decreased step length - right;Decreased weight shift to left;Ataxic;Lateral trunk lean to right;Narrow base of support Stairs / Additional Locomotion Stairs: Yes Stairs Assistance: 2: Max Editor, commissioning Details (indicate cue type and reason): Peformed stair negotiation with bilat rails and max A with step to technique; presented with R lateral lean, impaired control of R foot placement; required max A for L lateral weight shift to advance RLE on stairs and when pivoting Stair Management Technique: Two rails;Step to pattern;Forwards Number of Stairs: 5 Height of Stairs: 6 Wheelchair Mobility Wheelchair Mobility: No (total A recliner)  Trunk/Postural Assessment  Cervical Assessment Cervical Assessment: Within Functional Limits Thoracic Assessment Thoracic Assessment: Within Functional Limits Lumbar Assessment Lumbar Assessment:  (posterior tilt) Postural Control Postural Control: Deficits on evaluation (Stands with R trunk and lateral lean; poor L weight shift)  Balance Balance Balance Assessed: Yes Static Sitting Balance Static Sitting - Balance Support: No upper extremity supported;Feet supported Static Sitting - Level  of Assistance: 5: Stand by assistance Dynamic Sitting Balance Dynamic Sitting - Balance Support: Bilateral upper extremity supported;Left upper extremity supported;Right upper extremity supported Dynamic Sitting - Level of Assistance: 4: Min Oncologist Standing - Balance Support: Bilateral upper extremity supported Static Standing - Level of Assistance: 2: Max assist Dynamic Standing Balance Dynamic Standing - Balance Support: Bilateral upper extremity supported Dynamic Standing - Level of Assistance: 1: +1 Total assist;1: +2 Total assist Extremity Assessment  RLE Assessment RLE Assessment: Exceptions to [facility] RLE Strength RLE Overall Strength: Deficits RLE Overall Strength Comments: 3 to 4-/5 overall with poor endurance and poor motor control, grading of movement, coordination LLE Assessment LLE Assessment: Within Functional Limits  FIM:  FIM - Banker Devices: Arm rests Bed/Chair Transfer: 2: Bed > Chair or W/C: Max A (lift and lower assist);2: Chair or W/C > Bed: Max A (lift and lower assist) FIM - Locomotion: Wheelchair Locomotion: Wheelchair: 1: Total Assistance/staff pushes wheelchair (Pt<25%) FIM - Locomotion: Ambulation Ambulation/Gait Assistance: 1: +2 Total assist Locomotion: Ambulation: 1: Two helpers FIM - Locomotion: Stairs Locomotion: Building control surveyor: Radio broadcast assistant - 2 Locomotion: Stairs: 2: Up and Down 4 - 11 stairs with maximal assistance (Pt: 25 - 49%)   Refer to Care Plan for Long Term Goals  Recommendations for other services: Neuropsych  Discharge Criteria: Patient will be discharged from PT if patient refuses treatment 3 consecutive times without medical reason, if treatment goals not met, if there is a change in medical status, if patient makes no progress towards goals or if patient is discharged from hospital.  The above assessment, treatment plan, treatment alternatives and goals were  discussed and mutually agreed upon: by patient and by family  Juliaetta Desanctis 10/10/2012, 11:29 AM

## 2012-10-10 NOTE — Progress Notes (Signed)
Social Work Assessment and Plan Social Work Assessment and Plan  Patient Details  Name: Joe Snow MRN: [id number] Date of Birth: February 17, 1924  Today's Date: 10/10/2012  Problem List:  Patient Active Problem List  Diagnosis  . Embolic stroke involving left vertebral artery  . Chronic a-fib  . Diabetes  . Essential hypertension, benign  . Shortness of breath  . Dysphagia, pharyngeal-neurogenic  . Esophageal candidiasis  . Hiccups  . COPD exacerbation  . OSA on CPAP  . CVA (cerebral infarction)   Past Medical History:  Past Medical History  Diagnosis Date  . Hypertension associated with diabetes   . Diabetes   . Atrial fibrillation   . Embolic stroke involving left vertebral artery 10/02/2012  . Dysphagia, pharyngeal-neurogenic 10/05/2012   Past Surgical History:  Past Surgical History  Procedure Laterality Date  . Prostate surgery N/A 1995    estimate  . Tonsillectomy Bilateral 1930s  . Esophagogastroduodenoscopy N/A 10/05/2012    Procedure: ESOPHAGOGASTRODUODENOSCOPY (EGD);  Surgeon: Iva Boop, MD;  Location: [facility] ENDOSCOPY;  Service: Endoscopy;  Laterality: N/A;   Social History:  reports that he quit smoking about 34 years ago. His smoking use included Cigarettes. He smoked 0.00 packs per day. He has never used smokeless tobacco. He reports that he drinks about 1.0 ounces of alcohol per week. He reports that he does not use illicit drugs.  Family / Support Systems Marital Status: Married How Long?: 47 years Patient Roles: Spouse;Parent Spouse/Significant Other: Carney Bern  [phone]  [phone] Children: [name]-son  [phone]  [phone] Other Supports: Two daughter's very involved Anticipated Caregiver: Wife and children to assist Ability/Limitations of Caregiver: WIfe is in good health and willing to do waht she can for pt Caregiver Availability: 24/7 Family Dynamics: Close knit family three children are from pt's first marriage, but all get along and  are supportive of one another.  Social History Preferred language: English Religion:  Cultural Background: No issues Education: Product manager educated Read: Yes Write: Yes Employment Status: Retired Date Retired/Disabled/Unemployed: Still Systems analyst Issues: No issues Guardian/Conservator: None-according to MD pt is capable of making his own decisions.  Wife very much involved also   Abuse/Neglect Physical Abuse: Denies Verbal Abuse: Denies Sexual Abuse: Denies Exploitation of patient/patient's resources: Denies Self-Neglect: Denies  Emotional Status Pt's affect, behavior adn adjustment status: Pt is motivated to imporve and will do what he needs to do.  Wife is here and will take shifts along with his children to be here for him.  He has always been independent, so not used to relying upon others. Recent Psychosocial Issues: other medical issues but he managed Pyschiatric History: Was taking zoloft PTA wife reports MD placed him on it, thought it would help him.  Both feel doing well with the circumstances.  May help to have Neuro-psych eval while here.  Too tired to do depression screen, will try at a later time. Substance Abuse History: No issues  Patient / Family Perceptions, Expectations & Goals Pt/Family understanding of illness & functional limitations: Pt and wife can expalin his stroke and deficits.  Both are hopeful he will do well here.  Wife hopes he will get rid of the NG tube while here.  Pt reports: ' It has gone well so far. " Premorbid pt/family roles/activities: Husband, father, grandfather, retiree, Research scientist (medical), Mining engineer, etc Anticipated changes in roles/activities/participation: resume Pt/family expectations/goals: Pt states: " I want to be as independent as possibel before leaving here.  I am a hard worker."  Wife  states: " I hope his swallow gets better and he can eat regular food."  [employer]: None Premorbid  Home Care/DME Agencies: None Transportation available at discharge: [employer] referrals recommended: Support group (specify) (CVA Support group)  Discharge Planning Living Arrangements: Spouse/significant other Support Systems: Spouse/significant other;Children;Other relatives;Friends/neighbors;Church/faith community Type of Residence: Private residence Insurance Resources: Administrator (specify) Building services engineer secondary) Financial Resources: Tree surgeon;Other (Comment) Financial Screen Referred: No Living Expenses: Lives with family Money Management: Patient;Spouse Do you have any problems obtaining your medications?: No Home Management: Wife Patient/Family Preliminary Plans: Return home with wife who can be the main caregiver, children also to assist.  Wife and children have been taking turns staying with pt while hosptialized.  They feel it helps him to have a family member here, so they plan to be here alot. Social Work Anticipated Follow Up Needs: HH/OP;Support Group  Clinical Impression Pleasant gentleman who is motivated and ready to work in therapies.  Very supportive wife and family who will do what is needed to assist pt.  Family plans to be here a lot to participate in Therapies and provide emotional support.  Lucy Chris 10/10/2012, 11:49 AM

## 2012-10-10 NOTE — Evaluation (Signed)
Occupational Therapy Assessment and Plan  Patient Details  Name: Joe Snow MRN: [id number] Date of Birth: [birth year]  OT Diagnosis: abnormal posture, ataxia, disturbance of vision, hemiplegia affecting dominant side and muscle weakness (generalized) Rehab Potential: Rehab Potential: Good ELOS: 2-3 weeks   Today's Date: 10/10/2012 Time: 7829-5621 Time Calculation (min): 60 min  Problem List:  Patient Active Problem List  Diagnosis  . Embolic stroke involving left vertebral artery  . Chronic a-fib  . Diabetes  . Essential hypertension, benign  . Shortness of breath  . Dysphagia, pharyngeal-neurogenic  . Esophageal candidiasis  . Hiccups  . COPD exacerbation  . OSA on CPAP  . CVA (cerebral infarction)    Past Medical History:  Past Medical History  Diagnosis Date  . Hypertension associated with diabetes   . Diabetes   . Atrial fibrillation   . Embolic stroke involving left vertebral artery 10/02/2012  . Dysphagia, pharyngeal-neurogenic 10/05/2012   Past Surgical History:  Past Surgical History  Procedure Laterality Date  . Prostate surgery N/A 1995    estimate  . Tonsillectomy Bilateral 1930s  . Esophagogastroduodenoscopy N/A 10/05/2012    Procedure: ESOPHAGOGASTRODUODENOSCOPY (EGD);  Surgeon: Iva Boop, MD;  Location: [facility] ENDOSCOPY;  Service: Endoscopy;  Laterality: N/A;    Assessment & Plan Clinical Impression: Patient is a 79 y.o. right-handed male with documented history of hypertension, diabetes mellitus and atrial fibrillation off Coumadin due to diverticulitis with GI bleeding December 2013. Patient admitted 09/30/2012 with right-sided weakness and facial droop. He was initially taken to [facility] with cranial CT scan showing no acute intracranial abnormality. He was transferred to [facility] for further management. Patient did receive T[facility] at [facility]. Neurology service followup with workup ongoing . MRI of the brain 3/4  showed Small acute non hemorrhagic infarcts inferior aspect of  the right cerebellum and right posterior lateral aspect of the  medulla.  Carotid Dopplers with no ICA stenosis. Cardiology followup for history of atrial fibrillation and and echocardiogram completed showing ejection fraction 65% and normal systolic function. Placed on Xarelto for atrial fibrillation as well as stroke prophylaxis. Patient noted on 10/07/2012 with some increased confusion as well his swallowing difficulties MRI of the brain completed showing very been extension of the prior infarct with new small infarcts anterior right cerebellum and posterior right cerebellum. He was advised to continue present regimen with Xarelto. Patient with intermittent bouts of shortness of breath chest x-ray with no acute changes. ABGs initially consistent with hypoxia and chronic retention. Discuss with pulmonary in regards to ABGs and given that he intermittently is waking up gasping, concerned that he might have a component of sleep apnea and placed on CPAP. Followup speech therapy and findings of severe oral pharyngeal dysphagia as well as dysarthria with swallow study completed advised patient to be n.p.o. with nasogastric tube placed. There was some concern of possible need for gastrostomy tube and followup gastroenterology Dr. Leone Payor and continue to monitor. There was findings of esophageal candidiasis and placed on Diflucan x14 days .   Patient transferred to CIR on 10/09/2012 .    Patient currently requires max with basic self-care skills secondary to muscle weakness, decreased cardiorespiratoy endurance and decreased oxygen support, impaired timing and sequencing, unbalanced muscle activation, ataxia, decreased coordination and decreased motor planning, decreased safety awareness and decreased memory and decreased standing balance, decreased postural control, hemiplegia and decreased balance strategies.  Prior to hospitalization, patient could  complete ADLs with independent .  Patient will benefit from  skilled intervention to decrease level of assist with basic self-care skills and increase independence with basic self-care skills prior to discharge home with care partner.  Anticipate patient will require 24 hour supervision and follow up home health.  OT - End of Session Activity Tolerance: Tolerates 30+ min activity with multiple rests Endurance Deficit: Yes Endurance Deficit Description: Sedentary, loss of strength and energy PTA; difficulty breathing OT Assessment Rehab Potential: Good Barriers to Discharge: None OT Plan OT Intensity: Minimum of 1-2 x/day, 45 to 90 minutes OT Frequency: 5 out of 7 days OT Duration/Estimated Length of Stay: 2-3 weeks OT Treatment/Interventions: Balance/vestibular training;Cognitive remediation/compensation;Discharge planning;Disease mangement/prevention;DME/adaptive equipment instruction;Functional mobility training;Neuromuscular re-education;Pain management;Patient/family education;Psychosocial support;Self Care/advanced ADL retraining;Therapeutic Activities;Therapeutic Exercise;UE/LE Strength taining/ROM;UE/LE Coordination activities;Wheelchair propulsion/positioning OT Recommendation Recommendations for Other Services: Neuropsych consult Patient destination: Home Follow Up Recommendations: Home health OT;24 hour supervision/assistance Equipment Details: equipment TBD   Skilled Therapeutic Intervention OT eval completed, ADL assessment conducted at sit <> stand level from EOB.  Pt required max-mod assist sit <> stand with UE support.  Educated on safety with mobility due to high fall risk.  Pt completed stand pivot transfer with max assist for lift and controlled lowering.  Overall weakness, Rt > Lt, noted ataxia and dysmetria with [facility] and reaching for bathing and grooming items, with pt reporting "don't let me hit you".  OT Evaluation Precautions/Restrictions  Precautions Precautions:  Fall Precaution Comments: decreased safety awareness General   Vital Signs Therapy Vitals Pulse Rate: 80 BP: 148/60 mmHg Patient Position, if appropriate: Sitting Oxygen Therapy SpO2: 95 % O2 Device: None (Room air) Pain Pain Assessment Pain Assessment: No/denies pain Home Living/Prior Functioning Home Living Lives With: Spouse Available Help at Discharge: Family (patient has 3 children that can also assist) Type of Home: House Home Access: Stairs to enter [employer] of Steps: 4 Entrance Stairs-Rails: None Home Layout: Two level;Able to live on main level with bedroom/bathroom Alternate Level Stairs-Number of Steps: 20; 15 to landing + 5 more Alternate Level Stairs-Rails: Left Bathroom Shower/Tub: Health visitor: Handicapped height Bathroom Accessibility: Yes How Accessible: Accessible via wheelchair;Accessible via walker Home Adaptive Equipment: None Additional Comments: Home remodeled Prior Function Level of Independence: Independent with gait;Independent with transfers;Independent with basic ADLs Able to Take Stairs?: Yes Driving: Yes Vocation: Part time employment Vocation Requirements: Owns a Veterinary surgeon; worked part time at [employer] Comments: Patient's wife reports PTA patietn was on a medication that was making him feel down, was not working, spending a lot of time at home, sitting, low energy ADL ADL Eating: NPO Grooming: Setup Where Assessed-Grooming: Sitting at sink Upper Body Bathing: Minimal assistance Where Assessed-Upper Body Bathing: Edge of bed Lower Body Bathing: Moderate assistance Where Assessed-Lower Body Bathing: Edge of bed Toilet Transfer: Maximal assistance Toilet Transfer Method: Stand pivot ADL Comments: Pt with no clothes on eval, unable to assess dressing Vision/Perception  Vision - History Baseline Vision: Other (comment) (Herpes of Lt eye for 30 years, blurriness/double vision) Visual History:  Cataracts Patient Visual Report: No change from baseline Vision - Assessment Eye Alignment: Within Functional Limits Vision Assessment: Vision tested Ocular Range of Motion: Within Functional Limits Tracking/Visual Pursuits: Able to track stimulus in all quads without difficulty Saccades: Within functional limits Convergence: Within functional limits Perception Perception: Within Functional Limits Praxis Praxis: Intact  Cognition Overall Cognitive Status: Appears within functional limits for tasks assessed Arousal/Alertness: Awake/alert Attention: Selective Selective Attention: Appears intact Memory: Impaired Memory Impairment: Decreased recall of new information Awareness: Impaired Behaviors: Restless  Safety/Judgment: Impaired Sensation Sensation Light Touch: Appears Intact Stereognosis: Not tested Hot/Cold: Not tested Proprioception: Appears Intact Coordination Gross Motor Movements are Fluid and Coordinated: No Fine Motor Movements are Fluid and Coordinated: No Coordination and Movement Description: Mild RLE impaired coordination, ataxia and dysmetria Finger Nose Finger Test: ataxia and dysmetria Rt > Lt Motor  Motor Motor: Hemiplegia;Ataxia;Motor impersistence Mobility     Trunk/Postural Assessment  Cervical Assessment Cervical Assessment: Within Functional Limits Thoracic Assessment Thoracic Assessment: Within Functional Limits Lumbar Assessment Lumbar Assessment:  (posterior tilt) Postural Control Postural Control: Deficits on evaluation (Stands with R trunk and lateral lean; poor L weight shift)  Balance Balance Balance Assessed: Yes Static Sitting Balance Static Sitting - Balance Support: No upper extremity supported;Feet supported Static Sitting - Level of Assistance: 5: Stand by assistance Dynamic Sitting Balance Dynamic Sitting - Balance Support: Bilateral upper extremity supported;Left upper extremity supported;Right upper extremity  supported Dynamic Sitting - Level of Assistance: 4: Min Oncologist Standing - Balance Support: Bilateral upper extremity supported Static Standing - Level of Assistance: 2: Max assist Dynamic Standing Balance Dynamic Standing - Balance Support: Bilateral upper extremity supported Dynamic Standing - Level of Assistance: 1: +1 Total assist;1: +2 Total assist Extremity/Trunk Assessment RUE Assessment RUE Assessment: Exceptions to [facility] RUE AROM (degrees) RUE Overall AROM Comments: WFL RUE Strength RUE Overall Strength Comments: strength grossly 3/5 overall, weak grasp LUE Assessment LUE Assessment: Within Functional Limits (grossly 4/5 overall)  FIM:  FIM - Grooming Grooming Steps: Wash, rinse, dry face;Wash, rinse, dry hands;Oral care, brush teeth, clean dentures;Brush, comb hair Grooming: 5: Set-up assist to apply toothpaste FIM - Bathing Bathing Steps Patient Completed: Chest;Right Arm;Left Arm;Abdomen;Front perineal area;Right upper leg;Left upper leg Bathing: 3: Mod-Patient completes 5-7 47f 10 parts or 50-74% FIM - Upper Body Dressing/Undressing Upper body dressing/undressing: 0: Wears gown/pajamas-no public clothing FIM - Lower Body Dressing/Undressing Lower body dressing/undressing: 0: Wears Oceanographer FIM - Banker Devices: Bed rails;Arm rests Bed/Chair Transfer: 3: Supine > Sit: Mod A (lifting assist/Pt. 50-74%/lift 2 legs;2: Bed > Chair or W/C: Max A (lift and lower assist)   Refer to Care Plan for Long Term Goals  Recommendations for other services: Neuropsych  Discharge Criteria: Patient will be discharged from OT if patient refuses treatment 3 consecutive times without medical reason, if treatment goals not met, if there is a change in medical status, if patient makes no progress towards goals or if patient is discharged from hospital.  The above assessment, treatment plan, treatment  alternatives and goals were discussed and mutually agreed upon: by patient and by family  Leonette Monarch 10/10/2012, 11:53 AM

## 2012-10-10 NOTE — Progress Notes (Signed)
Patient information reviewed and entered into eRehab system by Marie Noel, RN, CRRN, PPS Coordinator.  Information including medical coding and functional independence measure will be reviewed and updated through discharge.     Per nursing patient was given "Data Collection Information Summary for Patients in Inpatient Rehabilitation Facilities with attached "Privacy Act Statement-Health Care Records" upon admission.  

## 2012-10-10 NOTE — Progress Notes (Signed)
Placed pt on CPAP via FFM ( ok by MD due to patient has N-G tube) settings of 10.0 cm H20 with 3 lpm O2 bleed in.  Pt. Tolerating well at this time.  RN aware.

## 2012-10-10 NOTE — Progress Notes (Signed)
INITIAL NUTRITION ASSESSMENT  DOCUMENTATION CODES Per approved criteria  -Not Applicable   INTERVENTION: 1. To better meet nutrition needs and accommodate therapy schedule, change formula to Glucerna 1.2. Initiate at 40 ml/hr, advance by 10 ml/hr every 4 hours to goal rate of Glucerna 1.2 to 80 ml/hr x 20 hours daily. (Anticipate TF to be held 4 hours daily for participation in therapy.) This goal rate will provide: 1920 kcal, 96 grams protein, 1288 ml free water. Discussed changes of TF plan with PA, RN and patient. 2. Add free water flushes of 200 ml QID - this will provide an additional 800 ml water daily 3. RD to continue to follow nutrition care plan  NUTRITION DIAGNOSIS: Inadequate oral intake related to inability to eat as evidenced by NPO status.   Goal: Intake to meet >90% of estimated nutrition needs.  Monitor:  weight trends, lab trends, I/O's, TF tolerance  Reason for Assessment: Malnutrition Screening Tool + New TF  79 y.o. male  Admitting Dx: CVA (cerebral infarction)  ASSESSMENT: Patient admitted 3/3 with R-sided weakness and facial droop. MRI of the brain 3/4 showed small acute non-hemorrhagic infarcts inferior aspect of the R cerebellum and R posterior lateral aspect of the medulla. Patient noted on 3/10 increased confusion and swallowing difficulties. F/u ST with findings of severe oral pharyngeal dysphagia and dysarthria. Swallow study completed with NPO rec's. NGT placed, pt received Jevity 1.2 at 65 ml/hr. There was findings of esophageal candidiasis and placed on Diflucan x 14 days .   Nutrition hx obtained from wife by RD on acute admission, wife states that because of pt's GIB in December, pt has been off coumadin and trying other medications. Wife reports pt has had multiple adverse reactions to these medications and feels that since this time, pt's appetite has not recovered. According to pt, he doesn't want to eat because "nothing tastes good." Wife states pt  drinks Glucerna at home.  Pt with ongoing elevated blood sugars.  Height: Ht Readings from Last 1 Encounters:  10/09/12 5' 11.5" (1.816 m)    Weight: Wt Readings from Last 1 Encounters:  10/09/12 197 lb 6.4 oz (89.54 kg)    Ideal Body Weight: 175 lb  % Ideal Body Weight: 113%  Wt Readings from Last 10 Encounters:  10/09/12 197 lb 6.4 oz (89.54 kg)  09/30/12 197 lb 8.5 oz (89.6 kg)  09/30/12 197 lb 8.5 oz (89.6 kg)    Usual Body Weight: 200 lb  % Usual Body Weight: 99%  BMI:  Body mass index is 27.15 kg/(m^2). Overweight.  Estimated Nutritional Needs: Kcal: 1900 - 2100 Protein: 90 - 105 grams Fluid: 1.9 - 2.1 liters  Skin: intact  Diet Order: NPO  EDUCATION NEEDS: -No education needs identified at this time  No intake or output data in the 24 hours ending 10/10/12 0948  Last BM: 3/7  Labs:   Recent Labs Lab 10/05/12 1550 10/10/12 0712  NA 142 141  K 3.7 3.4*  CL 101 100  CO2 31 32  BUN 17 18  CREATININE 1.26 1.24  CALCIUM 9.8 9.0  GLUCOSE 174* 231*    CBG (last 3)   Recent Labs  10/09/12 0640 10/09/12 1836 10/10/12 0612  GLUCAP 195* 237* 196*    Scheduled Meds: . fluconazole (DIFLUCAN) IV  50 mg Intravenous Q24H  . insulin aspart  0-15 Units Subcutaneous Q6H  . lisinopril  5 mg Oral Daily  . potassium chloride  10 mEq Oral BID  . rivaroxaban  15 mg Oral Q supper  . simvastatin  5 mg Oral q1800    Continuous Infusions: . feeding supplement (JEVITY 1.2 CAL)      Past Medical History  Diagnosis Date  . Hypertension associated with diabetes   . Diabetes   . Atrial fibrillation   . Embolic stroke involving left vertebral artery 10/02/2012  . Dysphagia, pharyngeal-neurogenic 10/05/2012    Past Surgical History  Procedure Laterality Date  . Prostate surgery N/A 1995    estimate  . Tonsillectomy Bilateral 1930s  . Esophagogastroduodenoscopy N/A 10/05/2012    Procedure: ESOPHAGOGASTRODUODENOSCOPY (EGD);  Surgeon: Iva Boop, MD;   Location: [facility] ENDOSCOPY;  Service: Endoscopy;  Laterality: N/A;    Jarold Motto MS, RD, LDN Pager: [phone] After-hours pager: [phone]

## 2012-10-10 NOTE — Evaluation (Signed)
Speech Language Pathology Assessment and Plan  Patient Details  Name: Joe Snow MRN: [id number] Date of Birth: 29-Nov-1923  SLP Diagnosis: Dysarthria;Dysphagia  Rehab Potential: Good ELOS: 2-3 weeks   Today's Date: 10/10/2012 Time: 1335-1430 Time Calculation (min): 55 min  Problem List:  Patient Active Problem List  Diagnosis  . Embolic stroke involving left vertebral artery  . Chronic a-fib  . Diabetes  . Essential hypertension, benign  . Shortness of breath  . Dysphagia, pharyngeal-neurogenic  . Esophageal candidiasis  . Hiccups  . COPD exacerbation  . OSA on CPAP  . CVA (cerebral infarction)   Past Medical History:  Past Medical History  Diagnosis Date  . Hypertension associated with diabetes   . Diabetes   . Atrial fibrillation   . Embolic stroke involving left vertebral artery 10/02/2012  . Dysphagia, pharyngeal-neurogenic 10/05/2012   Past Surgical History:  Past Surgical History  Procedure Laterality Date  . Prostate surgery N/A 1995    estimate  . Tonsillectomy Bilateral 1930s  . Esophagogastroduodenoscopy N/A 10/05/2012    Procedure: ESOPHAGOGASTRODUODENOSCOPY (EGD);  Surgeon: Iva Boop, MD;  Location: [facility] ENDOSCOPY;  Service: Endoscopy;  Laterality: N/A;    Assessment / Plan / Recommendation Clinical Impression  Joe Snow is an 79 y.o. right-handed male with documented history of hypertension, diabetes mellitus and atrial fibrillation off Coumadin due to diverticulitis with GI bleeding December 2013. Patient admitted 09/30/2012 with right-sided weakness and facial droop. He was initially taken to [facility] with cranial CT scan showing no acute intracranial abnormality. He was transferred to [facility] for further management. Patient did receive TPA at [facility]. MRI of the brain 3/4 showed small acute non hemorrhagic infarcts inferior aspect of the right cerebellum and right posterior lateral aspect of the medulla.  Carotid Dopplers with no ICA stenosis. Cardiology followup for history of atrial fibrillation and echocardiogram completed showing ejection fraction 65% and normal systolic function. Placed on Xarelto for atrial fibrillation as well as stroke prophylaxis. Patient noted on 10/07/2012 with some increased confusion as well his swallowing difficulties MRI of the brain completed showing extension of the prior infarct with new small infarcts anterior right cerebellum and posterior right cerebellum. He was advised to continue present medication regimen with Xarelto. Patient with intermittent bouts of shortness of breath chest x-ray with no acute changes. ABGs initially consistent with hypoxia and chronic retention. Discuss with pulmonary in regards to ABGs and given that he intermittently is waking up gasping, concerned that he might have a component of sleep apnea and placed on CPAP. Followup speech therapy and findings of severe pharyngeal dysphagia as well as dysarthria with swallow study completed advised patient to be NPO with nasogastric tube placed. There was some concern of possible need for gastrostomy tube and followup gastroenterology Dr. Leone Payor and continue to monitor. Of note, there were findings of esophageal candidiasis and placed on Diflucan x14 days. Recommendations for physical medicine rehabilitation were made; patient admitted to [facility] 10/09/12.  Speech and swallow evaluations completed and patient demonstrates moderate dysarthria and severe pharyngeal dysphagia characterized by decreased hyoid-laryngeal excursion and decreased management of secretions.  Additionally, adequate breath support and questionable vocal cord dysfunction maybe a contributing factor to shortness of breath which is impacting both his speech and swallow abilities at this time.  Given these deficits it is  SLP initiated treatment by facilitating swallow initiation and oral expectoration of secretions with  max assist verbal cues.    SLP Assessment  Patient will  need skilled Speech Lanaguage Pathology Services during CIR admission    Recommendations  Diet Recommendations: NPO Oral Care Recommendations: Oral care QID Patient destination: Home Follow up Recommendations: Outpatient SLP Equipment Recommended:  (TBD)    SLP Frequency 5 out of 7 days   SLP Treatment/Interventions Cueing hierarchy;Dysphagia/aspiration precaution training;Functional tasks;Internal/external aids;Patient/family education;Therapeutic Exercise;Neuromuscular electrical stimulation;Other (comment) (Pharyngeal strengthening, diaphragmatic breathing exercises)    Pain Pain Assessment Pain Assessment: No/denies pain Prior Functioning Cognitive/Linguistic Baseline: Within functional limits  Short Term Goals: Week 1: SLP Short Term Goal 1 (Week 1): Patient will demonstrate improved management of secreations with mod assist verbal cues. SLP Short Term Goal 2 (Week 1): Patient will participate in trials of ice chips with overt s/s of aspiraiton in less than 50% of trials. SLP Short Term Goal 3 (Week 1): Patient will perform diaphragmatic breathing exercises with mod assist verbal cues.  See FIM for current functional status Refer to Care Plan for Long Term Goals  Recommendations for other services: None  Discharge Criteria: Patient will be discharged from SLP if patient refuses treatment 3 consecutive times without medical reason, if treatment goals not met, if there is a change in medical status, if patient makes no progress towards goals or if patient is discharged from hospital.  The above assessment, treatment plan, treatment alternatives and goals were discussed and mutually agreed upon: by patient and by family  Charlane Ferretti., CCC-SLP [phone]  BOWIE,MELISSA 10/10/2012, 5:24 PM

## 2012-10-11 ENCOUNTER — Inpatient Hospital Stay (HOSPITAL_COMMUNITY): Payer: PRIVATE HEALTH INSURANCE | Admitting: Speech Pathology

## 2012-10-11 ENCOUNTER — Encounter (HOSPITAL_COMMUNITY): Payer: PRIVATE HEALTH INSURANCE | Admitting: Occupational Therapy

## 2012-10-11 ENCOUNTER — Inpatient Hospital Stay (HOSPITAL_COMMUNITY)
Admission: AD | Admit: 2012-10-11 | Discharge: 2012-10-17 | DRG: 065 | Disposition: A | Discharge status: Rehabilitation Facility | Payer: Medicare Other | Source: Ambulatory Visit | Attending: Internal Medicine | Admitting: Internal Medicine

## 2012-10-11 ENCOUNTER — Inpatient Hospital Stay (HOSPITAL_COMMUNITY): Payer: PRIVATE HEALTH INSURANCE | Admitting: Physical Therapy

## 2012-10-11 DIAGNOSIS — I6340 Cerebral infarction due to embolism of unspecified cerebral artery: Principal | ICD-10-CM | POA: Diagnosis present

## 2012-10-11 DIAGNOSIS — I10 Essential (primary) hypertension: Secondary | ICD-10-CM | POA: Diagnosis present

## 2012-10-11 DIAGNOSIS — Z794 Long term (current) use of insulin: Secondary | ICD-10-CM

## 2012-10-11 DIAGNOSIS — E785 Hyperlipidemia, unspecified: Secondary | ICD-10-CM | POA: Diagnosis present

## 2012-10-11 DIAGNOSIS — R471 Dysarthria and anarthria: Secondary | ICD-10-CM | POA: Diagnosis present

## 2012-10-11 DIAGNOSIS — E876 Hypokalemia: Secondary | ICD-10-CM | POA: Diagnosis present

## 2012-10-11 DIAGNOSIS — E1149 Type 2 diabetes mellitus with other diabetic neurological complication: Secondary | ICD-10-CM | POA: Diagnosis present

## 2012-10-11 DIAGNOSIS — Z87891 Personal history of nicotine dependence: Secondary | ICD-10-CM

## 2012-10-11 DIAGNOSIS — I4891 Unspecified atrial fibrillation: Secondary | ICD-10-CM | POA: Diagnosis present

## 2012-10-11 DIAGNOSIS — R1310 Dysphagia, unspecified: Secondary | ICD-10-CM

## 2012-10-11 DIAGNOSIS — Z79899 Other long term (current) drug therapy: Secondary | ICD-10-CM

## 2012-10-11 DIAGNOSIS — I639 Cerebral infarction, unspecified: Secondary | ICD-10-CM

## 2012-10-11 DIAGNOSIS — E119 Type 2 diabetes mellitus without complications: Secondary | ICD-10-CM

## 2012-10-11 DIAGNOSIS — Z431 Encounter for attention to gastrostomy: Secondary | ICD-10-CM

## 2012-10-11 DIAGNOSIS — I63112 Cerebral infarction due to embolism of left vertebral artery: Secondary | ICD-10-CM

## 2012-10-11 DIAGNOSIS — D649 Anemia, unspecified: Secondary | ICD-10-CM | POA: Diagnosis present

## 2012-10-11 DIAGNOSIS — B3781 Candidal esophagitis: Secondary | ICD-10-CM

## 2012-10-11 DIAGNOSIS — E039 Hypothyroidism, unspecified: Secondary | ICD-10-CM | POA: Diagnosis present

## 2012-10-11 DIAGNOSIS — I482 Chronic atrial fibrillation, unspecified: Secondary | ICD-10-CM

## 2012-10-11 DIAGNOSIS — J9600 Acute respiratory failure, unspecified whether with hypoxia or hypercapnia: Secondary | ICD-10-CM

## 2012-10-11 DIAGNOSIS — R1313 Dysphagia, pharyngeal phase: Secondary | ICD-10-CM

## 2012-10-11 DIAGNOSIS — E1142 Type 2 diabetes mellitus with diabetic polyneuropathy: Secondary | ICD-10-CM | POA: Diagnosis present

## 2012-10-11 HISTORY — DX: Cardiac arrhythmia, unspecified: I49.9

## 2012-10-11 LAB — CBC
Platelets: 199 10*3/uL (ref 150–400)
RBC: 4.83 MIL/uL (ref 4.22–5.81)
WBC: 11.1 10*3/uL — ABNORMAL HIGH (ref 4.0–10.5)

## 2012-10-11 LAB — COMPREHENSIVE METABOLIC PANEL
Albumin: 3.6 g/dL (ref 3.5–5.2)
BUN: 20 mg/dL (ref 6–23)
Creatinine, Ser: 1.23 mg/dL (ref 0.50–1.35)
Potassium: 3.7 mEq/L (ref 3.5–5.1)
Total Protein: 7.2 g/dL (ref 6.0–8.3)

## 2012-10-11 LAB — PROCALCITONIN: Procalcitonin: <0.1 ng/mL

## 2012-10-11 LAB — GLUCOSE, CAPILLARY
Glucose-Capillary: 240 mg/dL — ABNORMAL HIGH (ref 70–99)
Glucose-Capillary: 192 mg/dL — ABNORMAL HIGH (ref 70–99)
Glucose-Capillary: 199 mg/dL — ABNORMAL HIGH (ref 70–99)

## 2012-10-11 LAB — PHOSPHORUS: Phosphorus: 2.4 mg/dL (ref 2.3–4.6)

## 2012-10-11 LAB — MAGNESIUM: Magnesium: 2.1 mg/dL (ref 1.5–2.5)

## 2012-10-11 LAB — PROTIME-INR: Prothrombin Time: 13.6 seconds (ref 11.6–15.2)

## 2012-10-11 LAB — APTT: aPTT: 41 seconds — ABNORMAL HIGH (ref 24–37)

## 2012-10-11 MED ORDER — FAMOTIDINE IN NACL 20-0.9 MG/50ML-% IV SOLN
20.0000 mg | Freq: Two times a day (BID) | INTRAVENOUS | Status: DC
  Administered 2012-10-11 – 2012-10-13 (×5): 20 mg via INTRAVENOUS
  Filled 2012-10-11 (×8): qty 50

## 2012-10-11 MED ORDER — ETOMIDATE 2 MG/ML IV SOLN
40.0000 mg | Freq: Once | INTRAVENOUS | Status: DC
  Filled 2012-10-11: qty 20

## 2012-10-11 MED ORDER — FLUCONAZOLE 100MG IVPB
100.0000 mg | INTRAVENOUS | Status: DC
Start: 2012-10-11 — End: 2012-10-14
  Administered 2012-10-11 – 2012-10-13 (×3): 100 mg via INTRAVENOUS
  Filled 2012-10-11 (×4): qty 50

## 2012-10-11 MED ORDER — PREDNISOLONE ACETATE 1 % OP SUSP
1.0000 [drp] | Freq: Every day | OPHTHALMIC | Status: DC | PRN
  Filled 2012-10-11: qty 1

## 2012-10-11 MED ORDER — VECURONIUM BROMIDE 10 MG IV SOLR
10.0000 mg | Freq: Once | INTRAVENOUS | Status: DC
  Filled 2012-10-11: qty 10

## 2012-10-11 MED ORDER — CLONIDINE HCL 0.1 MG/24HR TD PTWK
0.1000 mg | MEDICATED_PATCH | TRANSDERMAL | Status: DC
  Administered 2012-10-11: 0.1 mg via TRANSDERMAL
  Filled 2012-10-11: qty 1

## 2012-10-11 MED ORDER — LEVOTHYROXINE SODIUM 100 MCG IV SOLR
44.0000 ug | Freq: Every day | INTRAVENOUS | Status: DC
Start: 2012-10-11 — End: 2012-10-17
  Administered 2012-10-11 – 2012-10-16 (×5): 44 ug via INTRAVENOUS
  Filled 2012-10-11 (×7): qty 5

## 2012-10-11 MED ORDER — MORPHINE SULFATE 2 MG/ML IJ SOLN
2.0000 mg | Freq: Once | INTRAMUSCULAR | Status: AC
  Filled 2012-10-11: qty 1

## 2012-10-11 MED ORDER — RIVAROXABAN 15 MG PO TABS: 15.0000 mg | ORAL_TABLET | Freq: Every day | ORAL | Status: DC

## 2012-10-11 MED ORDER — DEXTROSE-NACL 5-0.45 % IV SOLN
INTRAVENOUS | Status: DC
  Administered 2012-10-11 – 2012-10-13 (×2): via INTRAVENOUS

## 2012-10-11 MED ORDER — SODIUM CHLORIDE 0.9 % IV SOLN: 250.0000 mL | INTRAVENOUS | Status: DC | PRN

## 2012-10-11 MED ORDER — LORAZEPAM 2 MG/ML IJ SOLN: 0.5000 mg | Freq: Four times a day (QID) | INTRAMUSCULAR | Status: DC | PRN

## 2012-10-11 MED ORDER — POTASSIUM CHLORIDE 10 MEQ/100ML IV SOLN
10.0000 meq | INTRAVENOUS | Status: AC
  Administered 2012-10-11 (×4): 10 meq via INTRAVENOUS
  Filled 2012-10-11 (×4): qty 100

## 2012-10-11 MED ORDER — INSULIN ASPART 100 UNIT/ML ~~LOC~~ SOLN
0.0000 [IU] | SUBCUTANEOUS | Status: DC
  Administered 2012-10-11 (×3): 2 [IU] via SUBCUTANEOUS
  Administered 2012-10-12: 1 [IU] via SUBCUTANEOUS
  Administered 2012-10-12 (×3): 2 [IU] via SUBCUTANEOUS
  Administered 2012-10-12: 3 [IU] via SUBCUTANEOUS
  Administered 2012-10-12: 2 [IU] via SUBCUTANEOUS
  Administered 2012-10-12: 1 [IU] via SUBCUTANEOUS
  Administered 2012-10-13 – 2012-10-15 (×15): 2 [IU] via SUBCUTANEOUS
  Administered 2012-10-16: 1 [IU] via SUBCUTANEOUS
  Administered 2012-10-16: 2 [IU] via SUBCUTANEOUS
  Administered 2012-10-16 (×2): 1 [IU] via SUBCUTANEOUS
  Administered 2012-10-16 (×2): 2 [IU] via SUBCUTANEOUS
  Administered 2012-10-17: 1 [IU] via SUBCUTANEOUS
  Administered 2012-10-17 (×3): 2 [IU] via SUBCUTANEOUS
  Administered 2012-10-17: 17:00:00 via SUBCUTANEOUS

## 2012-10-11 MED ORDER — TRIFLURIDINE 1 % OP SOLN
1.0000 [drp] | Freq: Every day | OPHTHALMIC | Status: DC | PRN
  Filled 2012-10-11: qty 7.5

## 2012-10-11 MED ORDER — HYDRALAZINE HCL 20 MG/ML IJ SOLN
10.0000 mg | INTRAMUSCULAR | Status: DC | PRN
  Administered 2012-10-11 – 2012-10-12 (×2): 20 mg via INTRAVENOUS
  Administered 2012-10-12: 10 mg via INTRAVENOUS
  Administered 2012-10-13 – 2012-10-14 (×4): 20 mg via INTRAVENOUS
  Filled 2012-10-11 (×3): qty 1
  Filled 2012-10-11: qty 2
  Filled 2012-10-11 (×4): qty 1

## 2012-10-11 MED ORDER — BISACODYL 10 MG RE SUPP
10.0000 mg | Freq: Once | RECTAL | Status: AC
  Administered 2012-10-11: 10 mg via RECTAL
  Filled 2012-10-11: qty 1

## 2012-10-11 MED ORDER — FENTANYL CITRATE 0.05 MG/ML IJ SOLN: 200.0000 ug | Freq: Once | INTRAMUSCULAR | Status: DC

## 2012-10-11 MED ORDER — MIDAZOLAM HCL 2 MG/2ML IJ SOLN: 4.0000 mg | Freq: Once | INTRAMUSCULAR | Status: DC

## 2012-10-11 NOTE — Progress Notes (Signed)
Dr. Tyron Russell radiologist called to verify NG tube placement; Per MD ok to start tube feeds.

## 2012-10-11 NOTE — Discharge Summary (Signed)
  Discharge summary job # [id number]

## 2012-10-11 NOTE — Progress Notes (Signed)
Patient ID: Joe Snow, male   DOB: 1924-06-01, 79 y.o.   MRN: [id number] Subjective/Complaints: 79 y.o. right-handed male with documented history of hypertension, diabetes mellitus and atrial fibrillation off Coumadin due to diverticulitis with GI bleeding December 2013. Patient admitted 09/30/2012 with right-sided weakness and facial droop. He was initially taken to [facility] with cranial CT scan showing no acute intracranial abnormality. He was transferred to [facility] for further management. Patient did receive TPA at [facility]. Neurology service followup with workup ongoing . MRI of the brain 3/4 showed Small acute non hemorrhagic infarcts inferior aspect of  the right cerebellum and right posterior lateral aspect of the  medulla.  Carotid Dopplers with no ICA stenosis. Cardiology followup for history of atrial fibrillation and and echocardiogram completed showing ejection fraction 65% and normal systolic function. Placed on Xarelto for atrial fibrillation as well as stroke prophylaxis. Patient noted on 10/07/2012 with some increased confusion as well his swallowing difficulties MRI of the brain completed showing very been extension of the prior infarct with new small infarcts anterior right cerebellum and posterior right cerebellum. He was advised to continue present regimen with Xarelto. Patient with intermittent bouts of shortness of breath chest x-ray with no acute changes. ABGs initially consistent with hypoxia and chronic retention. Discuss with pulmonary in regards to ABGs and given that he intermittently is waking up gasping, concerned that he might have a component of sleep apnea and placed on CPAP. Followup speech therapy and findings of severe oral pharyngeal dysphagia as well as dysarthria with swallow study completed advised patient to be n.p.o. with nasogastric tube placed. There was some concern of possible need for gastrostomy tube and followup  gastroenterology Dr. Leone Payor and continue to monitor. There was findings of esophageal candidiasis and placed on Diflucan x14 days   Patient with episodes of gasping for air. He leans forward. He did suction himself for a moderate amount of saliva. This episode lasted about 1 minute. Voice is wet and hoarse Appreciate pulmonary note Coughed out NGT Objective: Vital Signs: Blood pressure 190/90, pulse 89, temperature 98.3 F (36.8 C), temperature source Oral, resp. rate 17, height 5' 11.5" (1.816 m), weight 92.6 kg (204 lb 2.3 oz), SpO2 95.00%. Dg Abd Portable 1v  10/10/2012  *RADIOLOGY REPORT*  Clinical Data: Nasogastric tube placement, coughing  PORTABLE ABDOMEN - 1 VIEW  Comparison: Portable exam 2243 hours compared to 10/09/2038  Findings: Nasogastric tube tip projects over gastric antrum. Retained contrast in colon. Calcified gallstones right upper quadrant. Single surgical clip in pelvis. Nonobstructive bowel gas pattern. Diffuse osseous demineralization. No urinary tract calcification.  IMPRESSION: Nasogastric tube tip projects over gastric antrum. Cholelithiasis.   Original Report Authenticated By: Ulyses Southward, M.D.    Results for orders placed during the hospital encounter of 10/09/12 (from the past 72 hour(s))  GLUCOSE, CAPILLARY     Status: Abnormal   Collection Time    10/09/12  6:36 PM      Result Value Range   Glucose-Capillary 237 (*) 70 - 99 mg/dL   Comment 1 Notify RN    GLUCOSE, CAPILLARY     Status: Abnormal   Collection Time    10/10/12 12:30 AM      Result Value Range   Glucose-Capillary 216 (*) 70 - 99 mg/dL  GLUCOSE, CAPILLARY     Status: Abnormal   Collection Time    10/10/12  6:12 AM      Result Value Range   Glucose-Capillary 196 (*) 70 -  99 mg/dL  CBC WITH DIFFERENTIAL     Status: Abnormal   Collection Time    10/10/12  7:12 AM      Result Value Range   WBC 7.8  4.0 - 10.5 K/uL   RBC 4.64  4.22 - 5.81 MIL/uL   Hemoglobin 12.4 (*) 13.0 - 17.0 g/dL   HCT  11.9  14.7 - 82.9 %   MCV 86.4  78.0 - 100.0 fL   MCH 26.7  26.0 - 34.0 pg   MCHC 30.9  30.0 - 36.0 g/dL   RDW 56.2  13.0 - 86.5 %   Platelets 176  150 - 400 K/uL   Neutrophils Relative 63  43 - 77 %   Neutro Abs 4.9  1.7 - 7.7 K/uL   Lymphocytes Relative 18  12 - 46 %   Lymphs Abs 1.4  0.7 - 4.0 K/uL   Monocytes Relative 16 (*) 3 - 12 %   Monocytes Absolute 1.2 (*) 0.1 - 1.0 K/uL   Eosinophils Relative 3  0 - 5 %   Eosinophils Absolute 0.2  0.0 - 0.7 K/uL   Basophils Relative 0  0 - 1 %   Basophils Absolute 0.0  0.0 - 0.1 K/uL  COMPREHENSIVE METABOLIC PANEL     Status: Abnormal   Collection Time    10/10/12  7:12 AM      Result Value Range   Sodium 141  135 - 145 mEq/L   Potassium 3.4 (*) 3.5 - 5.1 mEq/L   Chloride 100  96 - 112 mEq/L   CO2 32  19 - 32 mEq/L   Glucose, Bld 231 (*) 70 - 99 mg/dL   BUN 18  6 - 23 mg/dL   Creatinine, Ser 7.84  0.50 - 1.35 mg/dL   Calcium 9.0  8.4 - 69.6 mg/dL   Total Protein 6.7  6.0 - 8.3 g/dL   Albumin 3.3 (*) 3.5 - 5.2 g/dL   AST 66 (*) 0 - 37 U/L   ALT 60 (*) 0 - 53 U/L   Alkaline Phosphatase 94  39 - 117 U/L   Total Bilirubin 0.7  0.3 - 1.2 mg/dL   GFR calc non Af Amer 51 (*) >90 mL/min   GFR calc Af Amer 59 (*) >90 mL/min   Comment:            The eGFR has been calculated     using the CKD EPI equation.     This calculation has not been     validated in all clinical     situations.     eGFR's persistently     <90 mL/min signify     possible Chronic Kidney Disease.  GLUCOSE, CAPILLARY     Status: Abnormal   Collection Time    10/10/12 11:55 AM      Result Value Range   Glucose-Capillary 165 (*) 70 - 99 mg/dL   Comment 1 Notify RN    GLUCOSE, CAPILLARY     Status: Abnormal   Collection Time    10/10/12  5:53 PM      Result Value Range   Glucose-Capillary 191 (*) 70 - 99 mg/dL   Comment 1 Notify RN    GLUCOSE, CAPILLARY     Status: Abnormal   Collection Time    10/10/12  9:09 PM      Result Value Range   Glucose-Capillary  174 (*) 70 - 99 mg/dL   Comment 1 Notify RN  GLUCOSE, CAPILLARY     Status: Abnormal   Collection Time    10/11/12  5:15 AM      Result Value Range   Glucose-Capillary 240 (*) 70 - 99 mg/dL     HEENT: vocal hoarseness Heart regular rate and rhythm no murmurs Lungs have coarse wet upper airway sounds. No wheezing. No rales appreciated Abdomen positive bowel sounds soft nontender palpation Extremities no clubbing cyanosis or edema Neuro: Sensation intact to light touch in the facial area as well as the limbs. Motor strength is 5/5 in bilateral deltoid, biceps, triceps, grip, hip flexor, knee extensors, ankle dorsiflexor plantar flexor Right upper extremity has moderate ataxia on finger nose to finger testing. Right lower extremity is moderate ataxia with heel-to-shin testing. Sitting balance is fair Tongue is midline  Assessment/Plan: 1. Functional deficits secondary to Right posterior inferior cerebellar artery infarct  Unable to protect airway I agree with pulmonary that pt needs trach and PEG, pt wants family conference to decide.  If this is not pursued , will need directives for code status , mech vent etc Hold off on therapy until this is decided I feel pt will have an improvement in pulm status with time FIM: FIM - Bathing Bathing Steps Patient Completed: Chest;Right Arm;Left Arm;Abdomen;Front perineal area;Right upper leg;Left upper leg Bathing: 3: Mod-Patient completes 5-7 12f 10 parts or 50-74%  FIM - Upper Body Dressing/Undressing Upper body dressing/undressing: 0: Wears gown/pajamas-no public clothing FIM - Lower Body Dressing/Undressing Lower body dressing/undressing: 0: Wears gown/pajamas-no public clothing  FIM - Toileting Toileting: 0: Activity did not occur     FIM - Banker Devices: Bed rails;Arm rests Bed/Chair Transfer: 3: Supine > Sit: Mod A (lifting assist/Pt. 50-74%/lift 2 legs;2: Bed > Chair or W/C: Max A (lift  and lower assist)  FIM - Locomotion: Wheelchair Locomotion: Wheelchair: 1: Total Assistance/staff pushes wheelchair (Pt<25%) FIM - Locomotion: Ambulation Ambulation/Gait Assistance: 1: +2 Total assist Locomotion: Ambulation: 1: Two helpers  Comprehension Comprehension Mode: Auditory Comprehension: 5-Follows basic conversation/direction: With no assist  Expression Expression Mode: Verbal Expression: 4-Expresses basic 75 - 89% of the time/requires cueing 10 - 24% of the time. Needs helper to occlude trach/needs to repeat words.  Social Interaction Social Interaction: 5-Interacts appropriately 90% of the time - Needs monitoring or encouragement for participation or interaction.  Problem Solving Problem Solving: 3-Solves basic 50 - 74% of the time/requires cueing 25 - 49% of the time  Memory Memory: 4-Recognizes or recalls 75 - 89% of the time/requires cueing 10 - 24% of the time Medical Problem List and Plan:  1. Thromboembolic inferior right cerebellum and right posterior lateral medullary infarct  2. DVT Prophylaxis/Anticoagulation: Xarelto 15 mg daily -hold until trach decision made 3. Neuropsych: This patient is not today capable of making decisions on his/her own behalf.  4. Dysphagia. Patient remains n.p.o. with nasogastric tube feeds. Follow per speech therapy. Monitor for signs of aspiration  -reconsult GI depending upon whether he will need a G tube or not.  5. Atrial fibrillation. Continue Xarelto as advised. Cardiac rate control  6. Hypertension. Lisinopril 5 mg daily. Monitor with increased activity  7. Esophageal candidiasis. Continue Diflucan as advised  8. History of GI bleed/diverticulitis. Monitor hemoglobin and any signs of bleeding  9. Diabetes mellitus with peripheral neuropathy. Continue sliding scale insulin for now all maintained on nasogastric tube feeds  10. Pulmonary/likely sleep apnea. Check oxygen saturations every shift. D/C CPAP.as per pulm  PulmonologistHas been seen by cardiology already.This  patient is certainly a high risk for pneumonia   LOS (Days) 2 A FACE TO FACE EVALUATION WAS PERFORMED  KIRSTEINS,ANDREW E 10/11/2012, 8:25 AM

## 2012-10-11 NOTE — Progress Notes (Signed)
Pt c/o consitpation, soap suds enema given with a small amount of soft stool in return, Pt has had multiple episodes of coughing, gagging and inability to catch breath, pt coughed out NG tube, DrMarland Kitchen Wynn Banker in to see pt, blood pressure 180/100, pulse 100, pt attempting to get out of bed to catch breath,  catapress patch applied per MD order, awaiting family members to arrive to page pulmonologist, two daughters currently at bedside

## 2012-10-11 NOTE — H&P (Signed)
PULMONARY  / CRITICAL CARE MEDICINE  Name: Kyen Taite MRN: [id number] DOB: Nov 19, 1923    ADMISSION DATE:  10/09/2012 CONSULTATION DATE:  10/11/12  REFERRING MD :  Rehab MD.  CHIEF COMPLAINT:  Respiratory failure.  HPI: 79 year old with multiple previous stroke /  [facility] for [facility] and in rehab where he he is congitively intact and even transferring with assist and can speak with difficulty but retaining secretions and unable to swallow. Not eaten in 10 days. Unable to lie flat due to secretions. PEr neurology:  Mostly bulbar stroke with good prognosis to regain swallowing. PCCM asked to evaluate. After detailed family meeting patient and family decided to go with percutaenous dilataional trach and IR guided PEG. So PCCM admitting from rehab on 10/11/12 to 3100 ICU for trach on 10/14/12. Last dose xarelto 10/10/12  Family reluctant to having a panda in because it is uncomfortable and he coughs it out  LINES / TUBES: PIV  CULTURES: None  ANTIBIOTICS: None  PAST MEDICAL HISTORY :  Past Medical History  Diagnosis Date  . Hypertension associated with diabetes   . Diabetes   . Atrial fibrillation   . Embolic stroke involving left vertebral artery 10/02/2012  . Dysphagia, pharyngeal-neurogenic 10/05/2012   Past Surgical History  Procedure Laterality Date  . Prostate surgery N/A 1995    estimate  . Tonsillectomy Bilateral 1930s  . Esophagogastroduodenoscopy N/A 10/05/2012    Procedure: ESOPHAGOGASTRODUODENOSCOPY (EGD);  Surgeon: Iva Boop, MD;  Location: [facility] ENDOSCOPY;  Service: Endoscopy;  Laterality: N/A;   Prior to Admission medications   Medication Sig Start Date End Date Taking? Authorizing Provider  feeding supplement (GLUCERNA SHAKE) LIQD Take 237 mLs by mouth 2 (two) times daily between meals. 10/04/12   Layne Benton, NP  insulin detemir (LEVEMIR) 100 UNIT/ML injection Inject 20-25 Units into the skin 2 (two) times daily.    Historical Provider, MD   levothyroxine (SYNTHROID, LEVOTHROID) 88 MCG tablet Take 88 mcg by mouth daily.    Historical Provider, MD  lisinopril (PRINIVIL,ZESTRIL) 5 MG tablet Take 1 tablet (5 mg total) by mouth daily. 10/04/12   Layne Benton, NP  lovastatin (MEVACOR) 10 MG tablet Take 10 mg by mouth daily.    Historical Provider, MD  montelukast (SINGULAIR) 10 MG tablet Take 10 mg by mouth daily.    Historical Provider, MD  pioglitazone (ACTOS) 30 MG tablet Take 30 mg by mouth daily.    Historical Provider, MD  prednisoLONE acetate (PRED FORTE) 1 % ophthalmic suspension Place 1 drop into the left eye daily as needed (flares).    Historical Provider, MD  Rivaroxaban (XARELTO) 15 MG TABS tablet Take 1 tablet (15 mg total) by mouth daily with supper. 10/04/12   Layne Benton, NP  sertraline (ZOLOFT) 25 MG tablet Take 25 mg by mouth daily.    Historical Provider, MD  trifluridine (VIROPTIC) 1 % ophthalmic solution Place 1 drop into the left eye daily as needed (flares).    Historical Provider, MD   Allergies  Allergen Reactions  . Metoprolol Shortness Of Breath  . Amlodipine Other (See Comments)    Lethargy     FAMILY HISTORY:  No family history on file. SOCIAL HISTORY:  reports that he quit smoking about 34 years ago. His smoking use included Cigarettes. He smoked 0.00 packs per day. He has never used smokeless tobacco. He reports that he drinks about 1.0 ounces of alcohol per week. He reports that he does  not use illicit drugs.  REVIEW OF SYSTEMS:   Patient unable to articulate symptoms well but 12 point is negative other than mentioned above.  SUBJECTIVE:   VITAL SIGNS: Temp:  [97 F (36.1 C)-98.3 F (36.8 C)] 98.3 F (36.8 C) (03/14 0509) Pulse Rate:  [80-89] 89 (03/14 0509) Resp:  [17-22] 17 (03/14 0509) BP: (172-198)/(70-100) 180/100 mmHg (03/14 0852) SpO2:  [94 %-98 %] 95 % (03/14 0509) Weight:  [92.6 kg (204 lb 2.3 oz)] 92.6 kg (204 lb 2.3 oz) (03/14 0500)  PHYSICAL EXAMINATION: General:   Chronically ill appearing male, hoarse and verbalizes with difficulty Neuro:  Alert and interactive, moving all ext to command, clearly unable to protect airway, minimal gag. HEENT:  [place]/AT, PERRL, EOM-I and MMM. Neck:  Supple, -LAN and -thyromegally. Cardiovascular:  RRR, Nl S1/S2, -M/R/G. Lungs:  Coarse BS diffusely, more pronounced gurgling over the upper airway. Abdomen:  Soft, NT, ND and +BS. Musculoskeletal:  -edema and -tenderness. Skin:  Intact with multiple bruising.  Recent Labs Lab 10/05/12 1550 10/10/12 0712  NA 142 141  K 3.7 3.4*  CL 101 100  CO2 31 32  BUN 17 18  CREATININE 1.26 1.24  GLUCOSE 174* 231*    Recent Labs Lab 10/05/12 1550 10/06/12 0640 10/10/12 0712  HGB 13.0 12.6* 12.4*  HCT 40.6 40.3 40.1  WBC 9.9 9.5 7.8  PLT 186 207 176   Dg Abd Portable 1v  10/10/2012  *RADIOLOGY REPORT*  Clinical Data: Nasogastric tube placement, coughing  PORTABLE ABDOMEN - 1 VIEW  Comparison: Portable exam 2243 hours compared to 10/09/2038  Findings: Nasogastric tube tip projects over gastric antrum. Retained contrast in colon. Calcified gallstones right upper quadrant. Single surgical clip in pelvis. Nonobstructive bowel gas pattern. Diffuse osseous demineralization. No urinary tract calcification.  IMPRESSION: Nasogastric tube tip projects over gastric antrum. Cholelithiasis.   Original Report Authenticated By: Ulyses Southward, M.D.     Dg Abd Portable 1v  10/10/2012  *RADIOLOGY REPORT*  Clinical Data: Nasogastric tube placement, coughing  PORTABLE ABDOMEN - 1 VIEW  Comparison: Portable exam 2243 hours compared to 10/09/2038  Findings: Nasogastric tube tip projects over gastric antrum. Retained contrast in colon. Calcified gallstones right upper quadrant. Single surgical clip in pelvis. Nonobstructive bowel gas pattern. Diffuse osseous demineralization. No urinary tract calcification.  IMPRESSION: Nasogastric tube tip projects over gastric antrum. Cholelithiasis.   Original Report  Authenticated By: Ulyses Southward, M.D.     ASSESSMENT AND PLAN  RESPIRATORY  Recent Labs Lab 10/09/12 0247 10/09/12 0450  PHART 7.347* 7.326*  PCO2ART 55.2* 57.6*  PO2ART 56.7* 153.0*  HCO3 29.5* 29.2*  O2SAT 87.5 99.9   A:  Lot of difficulty managing resp scretions due to bulbar stroke. AT risk for intubation P: No NIMV Intubate if worsens  CARDIAC No results found for this basename: TROPONINI, PROBNP,  in the last 168 hours A:nil acute. bAseline a fib P: Monitor Hold xarelto on 10/10/12 for trach; give aspirin rectal  NEUROLOGIC A:  Bulbar stroke with dysphagia and resp secretino issues but motor and cognition good P: Per neuro  INFECTIOUS DISEASE  Recent Labs Lab 10/05/12 1550 10/06/12 0640 10/10/12 0712  WBC 9.9 9.5 7.8   A:  Nil acute but at risk for aspiration pna P: monitor  RENAL & ELECTROLYTES  Recent Labs Lab 10/05/12 1550 10/10/12 0712  NA 142 141  K 3.7 3.4*  CL 101 100  CO2 31 32  BUN 17 18  CREATININE 1.26 1.24  CALCIUM 9.8 9.0  Intake/Output     03/13 0701 - 03/14 0700 03/14 0701 - 03/15 0700   Urine (mL/kg/hr) 925 (0.4) 200 (0.5)   Total Output 925 200   Net -925 -200           A: mild hypokalemia P: replete  HEMATOLOGIC  Recent Labs Lab 10/05/12 1550 10/06/12 0640 10/10/12 0712  HGB 13.0 12.6* 12.4*  HCT 40.6 40.3 40.1  PLT 186 207 176   A:  Anemia of critical illness P: - PRBC for hgb </= 6.9gm%    - exceptions are   -  if ACS susepcted/confirmed then transfuse for hgb </= 8.0gm%,  or    -  If septic shock first 24h and scvo2 < 70% then transfuse for hgb </= 9.0gm%   - active bleeding with hemodynamic instability, then transfuse regardless of hemoglobin value   At at all times try to transfuse 1 unit prbc as possible with exception of active hemorrhage    GASTROINTESTINAL  Recent Labs Lab 10/10/12 0712  AST 66*  ALT 60*  ALKPHOS 94  BILITOT 0.7  PROT 6.7  ALBUMIN 3.3*   A:   Mild  transaminitis. Dysphagia due to stroke Dx of esophageal candidasis x 14 days starting 10/07/12 per gi  P: - d/w dr Leone Payor: GI will peg patient - reduce diflucan rx for 7 days from 10/07/12 (short term Rx enough per Dr Leone Payor due to mild nature)  ENDOCRINE  Recent Labs Lab 10/10/12 0612 10/10/12 1155 10/10/12 1753 10/10/12 2109 10/11/12 0515  GLUCAP 196* 165* 191* 174* 240*   A:  diabetic P:  ssi  DERMATOLOGY A:no bedsore P: monitor     GLOBAL: 10/11/12: Wife, son, and daughter x 2 extensively updated. They wish to proceed with trach/peg. They are in significant emotional distress due to his npo status and want him trrach and peg ASAP    Dr. Kalman Shan, M.D., [facility].C.P Pulmonary and Critical Care Medicine Staff Physician [facility] Pulmonary and Critical Care Pager: [phone], If no answer or between  15:00h - 7:00h: call [phone]  10/11/2012 11:10 AM

## 2012-10-11 NOTE — Progress Notes (Signed)
Report called to 3100, pt transferred via bed in stable condition, wife and son at bedside.

## 2012-10-11 NOTE — Significant Event (Signed)
Pt c/o pain at IV site with KCL infusion.  He is NPO.  Will give morphine 2 mg IV x one.  Coralyn Helling, MD [facility] Pulmonary/Critical Care 10/11/2012, 3:31 PM

## 2012-10-11 NOTE — Progress Notes (Signed)
Lisinopril 5mg  given to pt early for B/P =190/90 per Tressa Busman, PA; Dulcolax suppository to be administered per PA Dan Angiuilli. Will continue to monitor pt

## 2012-10-11 NOTE — Progress Notes (Addendum)
Called by Dr. Marchelle Gearing - re: persistent dysphagia. Patient will be getting tracheostomy Mon 3/17. We will follow-up re: possible PEG placement. Xarelto stopped today.

## 2012-10-12 ENCOUNTER — Inpatient Hospital Stay (HOSPITAL_COMMUNITY): Payer: Medicare Other

## 2012-10-12 DIAGNOSIS — I6350 Cerebral infarction due to unspecified occlusion or stenosis of unspecified cerebral artery: Secondary | ICD-10-CM

## 2012-10-12 DIAGNOSIS — I4891 Unspecified atrial fibrillation: Secondary | ICD-10-CM

## 2012-10-12 DIAGNOSIS — R1310 Dysphagia, unspecified: Secondary | ICD-10-CM

## 2012-10-12 DIAGNOSIS — B3781 Candidal esophagitis: Secondary | ICD-10-CM

## 2012-10-12 DIAGNOSIS — J9600 Acute respiratory failure, unspecified whether with hypoxia or hypercapnia: Secondary | ICD-10-CM

## 2012-10-12 LAB — GLUCOSE, CAPILLARY
Glucose-Capillary: 135 mg/dL — ABNORMAL HIGH (ref 70–99)
Glucose-Capillary: 148 mg/dL — ABNORMAL HIGH (ref 70–99)
Glucose-Capillary: 152 mg/dL — ABNORMAL HIGH (ref 70–99)
Glucose-Capillary: 170 mg/dL — ABNORMAL HIGH (ref 70–99)
Glucose-Capillary: 209 mg/dL — ABNORMAL HIGH (ref 70–99)

## 2012-10-12 LAB — CBC
MCH: 27.1 pg (ref 26.0–34.0)
MCV: 84.5 fL (ref 78.0–100.0)
Platelets: 188 10*3/uL (ref 150–400)
RDW: 15.6 % — ABNORMAL HIGH (ref 11.5–15.5)
WBC: 9.4 10*3/uL (ref 4.0–10.5)

## 2012-10-12 LAB — BASIC METABOLIC PANEL
Calcium: 9.2 mg/dL (ref 8.4–10.5)
Chloride: 99 mEq/L (ref 96–112)
Creatinine, Ser: 1.31 mg/dL (ref 0.50–1.35)
GFR calc Af Amer: 55 mL/min — ABNORMAL LOW (ref >90)
Sodium: 140 mEq/L (ref 135–145)

## 2012-10-12 NOTE — H&P (Signed)
PULMONARY  / CRITICAL CARE MEDICINE  Name: Joe Snow MRN: [id number] DOB: 06/21/1924    ADMISSION DATE:  10/11/2012 CONSULTATION DATE:  10/11/2012  REFERRING MD :  Rehab MD  BRIEF PATIENT DESCRIPTION: 79 yo with multiple strokes and dysphagia / difficulty handling secretions scheduled for trach / PEG placement.  LINES / TUBES:  CULTURES:  ANTIBIOTICS:  SUBJECTIVE: No issues overnight. Wfe updated.   VITAL SIGNS: Temp:  [97.8 F (36.6 C)-99 F (37.2 C)] 97.8 F (36.6 C) (03/15 0409) Pulse Rate:  [71-91] 71 (03/15 0700) Resp:  [13-22] 18 (03/15 0700) BP: (136-212)/(54-126) 148/87 mmHg (03/15 0700) SpO2:  [93 %-100 %] 96 % (03/15 0700) Weight:  [92.6 kg (204 lb 2.3 oz)] 92.6 kg (204 lb 2.3 oz) (03/14 1200)  PHYSICAL EXAMINATION: General:  Chronically ill appearing male, hoarse and verbalizes with difficulty Neuro:  Alert and interactive, moving all ext to command, clearly unable to protect airway, minimal gag.weak cough HEENT:  [place]/AT, PERRL, EOM-I and MMM. Neck:  Supple, -LAN and -thyromegally. Cardiovascular:  RRR, Nl S1/S2, -M/R/G. Lungs:  Coarse rhonchi diffusely, more pronounced gurgling over the upper airway. Abdomen:  Soft, NT, ND and +BS. Musculoskeletal:  -edema and -tenderness. Skin:  Intact with multiple bruising.  LABS:  Recent Labs Lab 10/05/12 1550 10/06/12 0640 10/09/12 0247 10/09/12 0450 10/10/12 0712 10/11/12 1454 10/12/12 0535  HGB 13.0 12.6*  --   --  12.4* 12.9* 11.7*  WBC 9.9 9.5  --   --  7.8 11.1* 9.4  PLT 186 207  --   --  176 199 188  NA 142  --   --   --  141 140 140  K 3.7  --   --   --  3.4* 3.7 3.7  CL 101  --   --   --  100 99 99  CO2 31  --   --   --  32 32 33*  GLUCOSE 174*  --   --   --  231* 192* 171*  BUN 17  --   --   --  18 20 18   CREATININE 1.26  --   --   --  1.24 1.23 1.31  CALCIUM 9.8  --   --   --  9.0 9.5 9.2  MG  --   --   --   --   --  2.1  --   PHOS  --   --   --   --   --  2.4  --   AST  --   --   --   --   66* 54*  --   ALT  --   --   --   --  60* 57*  --   ALKPHOS  --   --   --   --  94 96  --   BILITOT  --   --   --   --  0.7 0.7  --   PROT  --   --   --   --  6.7 7.2  --   ALBUMIN  --   --   --   --  3.3* 3.6  --   INR  --   --   --   --   --  1.05  --   APTT  --   --   --   --   --  41*  --   PHART  --   --  7.347* 7.326*  --   --   --  PCO2ART  --   --  55.2* 57.6*  --   --   --   PO2ART  --   --  56.7* 153.0*  --   --   --   HCO3  --   --  29.5* 29.2*  --   --   --   O2SAT  --   --  87.5 99.9  --   --   --    ASSESSMENT AND PLAN:  RESPIRATORY A:  Difficulty managing secretions secondary to bulbar stroke. P: NIMV contraindicated Intubate if worse Trach on 3/17   CARDIAC A: Chronic AF. P: Xarelto held 10/10/12 ASA  NEUROLOGIC A:  Bulbar stroke with dysphagia  P: Per Neurology Avoid sedatives   INFECTIOUS DISEASE A:  No active issues P: No intervention required  RENAL A: Mild hypokalemia P: Replace K PRN Trend BMP IVF D5 1/2NS  HEMATOLOGIC A:  Anemia of critical illness. P: Trend CBC  GASTROINTESTINAL A:  Mild transaminitis. Dysphagia. Esophageal candidiasis. P: GI following Awaiting PEG placement Diflucan  ENDOCRINE A:  DM2. P:  SSI  CLINICAL SUMMARY: 79 yo with multiple strokes and dysphagia / difficulty handling secretions scheduled for trach / PEG placement.  Holding Xarelto.  If respiratory difficulty - intubate, BiPAP is not indicated.  PARRETT,TAMMY NP-C  Pulmonary and Critical Care Medicine   Pulmonary and Critical Care  [phone]  3/15/20147:39 AM  I have personally obtained a history, examined the patient, evaluated laboratory and imaging results, formulated the assessment and plan and placed orders.  Lonia Farber, MD Pulmonary and Critical Care Medicine [facility] Pager: [phone]  10/12/2012, 11:15 AM

## 2012-10-12 NOTE — Discharge Summary (Deleted)
Joe Snow, Joe Snow NO.:  [id number]  MEDICAL RECORD NO.:  [id number]  LOCATION:  3104                         FACILITY:  [facility]  PHYSICIAN:  Erick Colace, M.D.DATE OF BIRTH:  05-Dec-1923  DATE OF ADMISSION:  10/09/2012 DATE OF DISCHARGE:  10/11/2012                              DISCHARGE SUMMARY   DISCHARGE DIAGNOSES: 1. 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 2. Dysphagia. 3. Pulmonary respiratory failure. 4. Atrial fibrillation. 5. Hypertension. 6. Esophageal candidiasis. 7. History of gastrointestinal bleed. 8. Diabetes mellitus. 9. Peripheral neuropathy.  HISTORY OF PRESENT ILLNESS:  This is an 79 year old right-handed male with history of hypertension, atrial fibrillation, off Coumadin due to diverticulitis since July 02, 2012, who was admitted September 30, 2012, with right-sided weakness and facial droop.  He was initially taken to [facility].  Cranial CT scan negative.  He was transferred to [facility] for further management.  The patient did receive t-PA at [facility].  MRI of the brain March 4, showed small acute nonhemorrhagic infarct inferior aspect of the right cerebellum and posterolateral aspects of the medulla.  Carotid Dopplers with no ICA stenosis.  Cardiology followup for atrial fibrillation. Echocardiogram showing ejection fraction 65% and normal systolic function, placed on Xarelto for stroke prophylaxis as well as atrial fibrillation.  The patient noted October 07, 2012, with some increased confusion as well as swallowing difficulties.  MRI of the brain completed showing that there have been extension of the prior infarct with a new small infarct anterior cerebellum and posterior right cerebellum.  He remained on Xarelto.  Bouts of increased shortness of breath.  Chest x-ray negative.  ABGs consistent  with hypoxia, discussed with Pulmonary Services while on acute care, intermittent, waking up, and gasping, concerned that he might have a component of sleep apnea and initially placed on CPAP.  Followup speech therapy for severe oropharyngeal dysphagia, he was made n.p.o. with nasogastric tube feeds. There was some current syringe he may need a gastrostomy tube feeds for nutritional support.  The patient was admitted for comprehensive rehab program.  PAST MEDICAL HISTORY:  See discharge diagnoses.  SOCIAL HISTORY:  She lives with spouse.  FUNCTIONAL HISTORY:  Prior to admission was independent.  FUNCTIONAL STATUS:  Upon admission to rehab services was +2 total assist to ambulate 60 feet with a rolling walker.  PHYSICAL EXAMINATION:  VITAL SIGNS:  Blood pressure 130/74, pulse 85, temperature 97.9, respirations 18. GENERAL:  This was an alert male.  He was able to name person inconsistent to follow commands.  EXTREMITIES:  He moved all extremities. LUNGS:  Decreased breath sounds. CARDIAC:  Rate controlled. ABDOMEN:  Soft, nontender.  Good bowel sounds.  REHABILITATION HOSPITAL COURSE:  The patient was admitted to inpatient rehab services with therapies initiated on a 3-hour daily basis consisting of physical therapy, occupational therapy, speech therapy, and rehabilitation nursing.  The following issues were addressed during the patient's rehabilitation stay.  Pertaining to [name] thromboembolic inferior right cerebellum posterolateral medullary infarct remained stable, maintained on Xarelto.  He would follow up Neurology Services.  Noted issues of dysphagia, he  was n.p.o., nasogastric tube feeds were in place.  There was some concerns that he may need PEG tube with followup per speech therapy.  His blood pressures remained controlled and monitored.  During his rehab stay, he noted also issues in relation to pulmonary suspect sleep apnea.  However, Pulmonary Services did  follow up.  This was discontinued.  He was having issues in regards to frequent episodes of sporadic respiratory distress and choking more frequently at night from a pulmonary standpoint formally consulted to Pulmonary Services October 10, 2012, felt that a tracheostomy tube may need to be placed.  All issues in regards to this were discussed with family.  Due to this ongoing care, he would be discharged to acute care services for both tracheostomy and consideration of PEG tube for nutritional support.  All medication changes were addressed as per Pulmonary Services.  The patient will be considered back to [facility] once medically stable to continue ongoing therapies.     Mariam Dollar, P.A.   ______________________________ Erick Colace, M.D.    DA/MEDQ  D:  10/11/2012  T:  10/12/2012  Job:  [id number]  cc:   Erick Colace, M.D. Kalman Shan, MD

## 2012-10-12 NOTE — Discharge Summary (Signed)
NAME:  Joe Snow, Joe Snow             ACCOUNT NO.:  [id number]  MEDICAL RECORD NO.:  [id number]  LOCATION:  3104                         FACILITY:  [facility]  PHYSICIAN:  Andrew E. Kirsteins, M.D.DATE OF BIRTH:  11/28/1923  DATE OF ADMISSION:  10/09/2012 DATE OF DISCHARGE:  10/11/2012                              DISCHARGE SUMMARY   DISCHARGE DIAGNOSES: 1. 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 2. Dysphagia. 3. Pulmonary respiratory failure. 4. Atrial fibrillation. 5. Hypertension. 6. Esophageal candidiasis. 7. History of gastrointestinal bleed. 8. Diabetes mellitus. 9. Peripheral neuropathy.  HISTORY OF PRESENT ILLNESS:  This is an 79-year-old right-handed male with history of hypertension, atrial fibrillation, off Coumadin due to diverticulitis since July 02, 2012, who was admitted September 30, 2012, with right-sided weakness and facial droop.  He was initially taken to [facility].  Cranial CT scan negative.  He was transferred to [facility] for further management.  The patient did receive t-PA at [facility].  MRI of the brain March 4, showed small acute nonhemorrhagic infarct inferior aspect of the right cerebellum and posterolateral aspects of the medulla.  Carotid Dopplers with no ICA stenosis.  Cardiology followup for atrial fibrillation. Echocardiogram showing ejection fraction 65% and normal systolic function, placed on Xarelto for stroke prophylaxis as well as atrial fibrillation.  The patient noted October 07, 2012, with some increased confusion as well as swallowing difficulties.  MRI of the brain completed showing that there have been extension of the prior infarct with a new small infarct anterior cerebellum and posterior right cerebellum.  He remained on Xarelto.  Bouts of increased shortness of breath.  Chest x-ray negative.  ABGs consistent  with hypoxia, discussed with Pulmonary Services while on acute care, intermittent, waking up, and gasping, concerned that he might have a component of sleep apnea and initially placed on CPAP.  Followup speech therapy for severe oropharyngeal dysphagia, he was made n.p.o. with nasogastric tube feeds. There was some current syringe he may need a gastrostomy tube feeds for nutritional support.  The patient was admitted for comprehensive rehab program.  PAST MEDICAL HISTORY:  See discharge diagnoses.  SOCIAL HISTORY:  She lives with spouse.  FUNCTIONAL HISTORY:  Prior to admission was independent.  FUNCTIONAL STATUS:  Upon admission to rehab services was +2 total assist to ambulate 60 feet with a rolling walker.  PHYSICAL EXAMINATION:  VITAL SIGNS:  Blood pressure 130/74, pulse 85, temperature 97.9, respirations 18. GENERAL:  This was an alert male.  He was able to name person inconsistent to follow commands.  EXTREMITIES:  He moved all extremities. LUNGS:  Decreased breath sounds. CARDIAC:  Rate controlled. ABDOMEN:  Soft, nontender.  Good bowel sounds.  REHABILITATION HOSPITAL COURSE:  The patient was admitted to inpatient rehab services with therapies initiated on a 3-hour daily basis consisting of physical therapy, occupational therapy, speech therapy, and rehabilitation nursing.  The following issues were addressed during the patient's rehabilitation stay.  Pertaining to Mr. Giuliano thromboembolic inferior right cerebellum posterolateral medullary infarct remained stable, maintained on Xarelto.  He would follow up Neurology Services.  Noted issues of dysphagia, he   was n.p.o., nasogastric tube feeds were in place.  There was some concerns that he may need PEG tube with followup per speech therapy.  His blood pressures remained controlled and monitored.  During his rehab stay, he noted also issues in relation to pulmonary suspect sleep apnea.  However, Pulmonary Services did  follow up.  This was discontinued.  He was having issues in regards to frequent episodes of sporadic respiratory distress and choking more frequently at night from a pulmonary standpoint formally consulted to Pulmonary Services October 10, 2012, felt that a tracheostomy tube may need to be placed.  All issues in regards to this were discussed with family.  Due to this ongoing care, he would be discharged to acute care services for both tracheostomy and consideration of PEG tube for nutritional support.  All medication changes were addressed as per Pulmonary Services.  The patient will be considered back to Rehab Services once medically stable to continue ongoing therapies.     Cruzita Lipa, P.A.   ______________________________ Andrew E. Kirsteins, M.D.    DA/MEDQ  D:  10/11/2012  T:  10/12/2012  Job:  [id number]  cc:   Andrew E. Kirsteins, M.D. Murali Ramaswamy, MD 

## 2012-10-12 NOTE — Progress Notes (Signed)
I was called by the bedside nurse; the patient developed acute respiratory distress; he has h/o multiple strokes and has been having problems managing his secretions; chronic aspiration;   Plan for trach and PEG Monday 10/14/12   Deep tracheal suctioning to be performed; if he continues to have respiratory problems we will have a very low threshold intubation;;

## 2012-10-12 NOTE — Discharge Summary (Deleted)
NAME:  Joe Snow, Joe Snow             ACCOUNT NO.:  [id number]  MEDICAL RECORD NO.:  [id number]  LOCATION:  3104                         FACILITY:  [facility]  PHYSICIAN:  Andrew E. Kirsteins, M.D.DATE OF BIRTH:  02/05/1924  DATE OF ADMISSION:  10/09/2012 DATE OF DISCHARGE:  10/11/2012                              DISCHARGE SUMMARY   DISCHARGE DIAGNOSES: 1. 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 2. Dysphagia. 3. Pulmonary respiratory failure. 4. Atrial fibrillation. 5. Hypertension. 6. Esophageal candidiasis. 7. History of gastrointestinal bleed. 8. Diabetes mellitus. 9. Peripheral neuropathy.  HISTORY OF PRESENT ILLNESS:  This is an 79-year-old right-handed male with history of hypertension, atrial fibrillation, off Coumadin due to diverticulitis since July 02, 2012, who was admitted September 30, 2012, with right-sided weakness and facial droop.  He was initially taken to [facility].  Cranial CT scan negative.  He was transferred to [place] for further management.  The patient did receive t-PA at [facility].  MRI of the brain March 4, showed small acute nonhemorrhagic infarct inferior aspect of the right cerebellum and posterolateral aspects of the medulla.  Carotid Dopplers with no ICA stenosis.  Cardiology followup for atrial fibrillation. Echocardiogram showing ejection fraction 65% and normal systolic function, placed on Xarelto for stroke prophylaxis as well as atrial fibrillation.  The patient noted October 07, 2012, with some increased confusion as well as swallowing difficulties.  MRI of the brain completed showing that there have been extension of the prior infarct with a new small infarct anterior cerebellum and posterior right cerebellum.  He remained on Xarelto.  Bouts of increased shortness of breath.  Chest x-ray negative.  ABGs consistent  with hypoxia, discussed with Pulmonary Services while on acute care, intermittent, waking up, and gasping, concerned that he might have a component of sleep apnea and initially placed on CPAP.  Followup speech therapy for severe oropharyngeal dysphagia, he was made n.p.o. with nasogastric tube feeds. There was some current syringe he may need a gastrostomy tube feeds for nutritional support.  The patient was admitted for comprehensive rehab program.  PAST MEDICAL HISTORY:  See discharge diagnoses.  SOCIAL HISTORY:  She lives with spouse.  FUNCTIONAL HISTORY:  Prior to admission was independent.  FUNCTIONAL STATUS:  Upon admission to rehab services was +2 total assist to ambulate 60 feet with a rolling walker.  PHYSICAL EXAMINATION:  VITAL SIGNS:  Blood pressure 130/74, pulse 85, temperature 97.9, respirations 18. GENERAL:  This was an alert male.  He was able to name person inconsistent to follow commands.  EXTREMITIES:  He moved all extremities. LUNGS:  Decreased breath sounds. CARDIAC:  Rate controlled. ABDOMEN:  Soft, nontender.  Good bowel sounds.  REHABILITATION HOSPITAL COURSE:  The patient was admitted to inpatient rehab services with therapies initiated on a 3-hour daily basis consisting of physical therapy, occupational therapy, speech therapy, and rehabilitation nursing.  The following issues were addressed during the patient's rehabilitation stay.  Pertaining to Joe Snow thromboembolic inferior right cerebellum posterolateral medullary infarct remained stable, maintained on Xarelto.  He would follow up Neurology Services.  Noted issues of dysphagia, he   was n.p.o., nasogastric tube feeds were in place.  There was some concerns that he may need PEG tube with followup per speech therapy.  His blood pressures remained controlled and monitored.  During his rehab stay, he noted also issues in relation to pulmonary suspect sleep apnea.  However, Pulmonary Services did  follow up.  This was discontinued.  He was having issues in regards to frequent episodes of sporadic respiratory distress and choking more frequently at night from a pulmonary standpoint formally consulted to Pulmonary Services October 10, 2012, felt that a tracheostomy tube may need to be placed.  All issues in regards to this were discussed with family.  Due to this ongoing care, he would be discharged to acute care services for both tracheostomy and consideration of PEG tube for nutritional support.  All medication changes were addressed as per Pulmonary Services.  The patient will be considered back to Rehab Services once medically stable to continue ongoing therapies.     Daniel Angiulli, P.A.   ______________________________ Andrew E. Kirsteins, M.D.    DA/MEDQ  D:  10/11/2012  T:  10/12/2012  Job:  [id number]  cc:   Andrew E. Kirsteins, M.D. Murali Ramaswamy, MD 

## 2012-10-13 ENCOUNTER — Encounter (HOSPITAL_COMMUNITY): Payer: Self-pay | Admitting: *Deleted

## 2012-10-13 LAB — BLOOD GAS, ARTERIAL
Acid-Base Excess: 5.2 mmol/L — ABNORMAL HIGH (ref 0.0–2.0)
O2 Saturation: 98.7 %
Patient temperature: 98.6
TCO2: 30.9 mmol/L (ref 0–100)
pH, Arterial: 7.422 (ref 7.350–7.450)

## 2012-10-13 LAB — GLUCOSE, CAPILLARY
Glucose-Capillary: 165 mg/dL — ABNORMAL HIGH (ref 70–99)
Glucose-Capillary: 153 mg/dL — ABNORMAL HIGH (ref 70–99)

## 2012-10-13 MED ORDER — LEVALBUTEROL HCL 0.63 MG/3ML IN NEBU: 0.6300 mg | INHALATION_SOLUTION | Freq: Four times a day (QID) | RESPIRATORY_TRACT | Status: DC | PRN

## 2012-10-13 NOTE — Progress Notes (Signed)
eLink Physician-Brief Progress Note Patient Name: Joe Snow DOB: May 11, 1924 MRN: [id number]  Date of Service  10/13/2012   HPI/Events of Note   abg wnl  eICU Interventions  No distress, continue to monitor   Intervention Category Intermediate Interventions: Diagnostic test evaluation  Nelda Bucks. 10/13/2012, 4:08 AM

## 2012-10-13 NOTE — Progress Notes (Signed)
PULMONARY  / CRITICAL CARE MEDICINE  Name: Joe Snow MRN: [id number] DOB: 1923/08/02    ADMISSION DATE:  10/11/2012 CONSULTATION DATE:  10/11/2012  REFERRING MD :  Rehab MD  BRIEF PATIENT DESCRIPTION: 79 yo with multiple strokes and dysphagia / difficulty handling secretions scheduled for trach / PEG placement.  LINES / TUBES:  CULTURES:  ANTIBIOTICS:  SUBJECTIVE:  Episode last pm, with increased WOB and agitated -CXR w/ no acute issues . ABG wnl.  NT sxn helped quite a bit .    VITAL SIGNS: Temp:  [97.4 F (36.3 C)-98.1 F (36.7 C)] 97.8 F (36.6 C) (03/16 0341) Pulse Rate:  [68-101] 80 (03/16 0600) Resp:  [12-22] 13 (03/16 0500) BP: (130-192)/(54-143) 168/94 mmHg (03/16 0600) SpO2:  [92 %-100 %] 98 % (03/16 0600)  PHYSICAL EXAMINATION: General:  Chronically ill appearing male, hoarse and verbalizes with difficulty Neuro:  Alert and interactive, moving all ext to command, clearly unable to protect airway, minimal gag.weak cough HEENT:  [place]/AT, PERRL, EOM-I and MMM. Neck:  Supple, -LAN and -thyromegally. Cardiovascular:  RRR, Nl S1/S2, -M/R/G. Lungs:  Coarse rhonchi diffusely, more pronounced gurgling over the upper airway. Abdomen:  Soft, NT, ND and +BS. Musculoskeletal:  -edema and -tenderness. Skin:  Intact with multiple bruising.  LABS:  Recent Labs Lab 10/09/12 0247 10/09/12 0450  10/10/12 0712 10/11/12 1454 10/12/12 0535 10/13/12 0325  HGB  --   --   --  12.4* 12.9* 11.7*  --   WBC  --   --   --  7.8 11.1* 9.4  --   PLT  --   --   --  176 199 188  --   NA  --   --   --  141 140 140  --   K  --   --   < > 3.4* 3.7 3.7  --   CL  --   --   --  100 99 99  --   CO2  --   --   --  32 32 33*  --   GLUCOSE  --   --   --  231* 192* 171*  --   BUN  --   --   --  18 20 18   --   CREATININE  --   --   --  1.24 1.23 1.31  --   CALCIUM  --   --   --  9.0 9.5 9.2  --   MG  --   --   --   --  2.1  --   --   PHOS  --   --   --   --  2.4  --   --   AST  --    --   --  66* 54*  --   --   ALT  --   --   --  60* 57*  --   --   ALKPHOS  --   --   --  94 96  --   --   BILITOT  --   --   --  0.7 0.7  --   --   PROT  --   --   --  6.7 7.2  --   --   ALBUMIN  --   --   --  3.3* 3.6  --   --   INR  --   --   --   --  1.05  --   --  APTT  --   --   --   --  41*  --   --   PHART 7.347* 7.326*  --   --   --   --  7.422  PCO2ART 55.2* 57.6*  --   --   --   --  46.1*  PO2ART 56.7* 153.0*  --   --   --   --  93.2  HCO3 29.5* 29.2*  --   --   --   --  29.5*  O2SAT 87.5 99.9  --   --   --   --  98.7  < > = values in this interval not displayed. ASSESSMENT AND PLAN:  RESPIRATORY A:  Difficulty managing secretions secondary to bulbar stroke. P: NIMV contraindicated Intubate if worse Trach on 3/17  Add xopenex neb for pulm hygiene   CARDIAC A: Chronic AF. HTN  P: Xarelto held 10/10/12 ASA Hydralazine prn   NEUROLOGIC A:  Bulbar stroke with dysphagia  P: Per Neurology Avoid sedatives   INFECTIOUS DISEASE A:  No active issues P: No intervention required  RENAL A: Mild hypokalemia P: Replace K PRN Trend BMP IVF D5 1/2NS  HEMATOLOGIC A:  Anemia of critical illness. P: Trend CBC  GASTROINTESTINAL A:  Mild transaminitis. Dysphagia. Esophageal candidiasis. P: GI following Awaiting PEG placement Diflucan Cont IVF -D5 1/2 NS   ENDOCRINE A:  DM2. P:  SSI  CLINICAL SUMMARY: 79 yo with multiple strokes and dysphagia / difficulty handling secretions scheduled for trach / PEG placement.  Holding Xarelto.  If respiratory difficulty - intubate, BiPAP is not indicated.  PARRETT,TAMMY NP-C  Pulmonary and Critical Care Medicine  [facility]  [phone]  3/16/20147:31 AM  I have personally obtained a history, examined the patient, evaluated laboratory and imaging results, formulated the assessment and plan and placed orders.  Lonia Farber, MD Pulmonary and Critical Care Medicine [facility] Pager: [phone]  10/13/2012, 3:08 PM

## 2012-10-13 NOTE — Progress Notes (Signed)
eLink Physician-Brief Progress Note Patient Name: Samay Delcarlo DOB: 1923-10-18 MRN: [id number]  Date of Service  10/13/2012   HPI/Events of Note   lethargic  eICU Interventions  abg   Intervention Category Major Interventions: Acute renal failure - evaluation and management  Zeynep Fantroy J. 10/13/2012, 3:30 AM

## 2012-10-14 ENCOUNTER — Encounter (HOSPITAL_COMMUNITY): Payer: PRIVATE HEALTH INSURANCE

## 2012-10-14 DIAGNOSIS — R1313 Dysphagia, pharyngeal phase: Secondary | ICD-10-CM

## 2012-10-14 LAB — GLUCOSE, CAPILLARY
Glucose-Capillary: 173 mg/dL — ABNORMAL HIGH (ref 70–99)
Glucose-Capillary: 159 mg/dL — ABNORMAL HIGH (ref 70–99)

## 2012-10-14 LAB — CBC
HCT: 40.6 % (ref 39.0–52.0)
Hemoglobin: 12.7 g/dL — ABNORMAL LOW (ref 13.0–17.0)
RBC: 4.83 MIL/uL (ref 4.22–5.81)
WBC: 7.9 10*3/uL (ref 4.0–10.5)

## 2012-10-14 LAB — BASIC METABOLIC PANEL
BUN: 12 mg/dL (ref 6–23)
CO2: 33 mEq/L — ABNORMAL HIGH (ref 19–32)
Chloride: 102 mEq/L (ref 96–112)
Glucose, Bld: 183 mg/dL — ABNORMAL HIGH (ref 70–99)
Potassium: 3.3 mEq/L — ABNORMAL LOW (ref 3.5–5.1)

## 2012-10-14 MED ORDER — DEXTROSE 5 % IV SOLN
250.0000 mg | INTRAVENOUS | Status: DC | PRN
  Filled 2012-10-14: qty 5

## 2012-10-14 MED ORDER — HYDRALAZINE HCL 20 MG/ML IJ SOLN
10.0000 mg | INTRAMUSCULAR | Status: DC | PRN
  Administered 2012-10-14 – 2012-10-15 (×2): 20 mg via INTRAVENOUS
  Filled 2012-10-14 (×3): qty 1

## 2012-10-14 MED ORDER — KCL IN DEXTROSE-NACL 40-5-0.45 MEQ/L-%-% IV SOLN
INTRAVENOUS | Status: DC
  Administered 2012-10-14 – 2012-10-15 (×3): via INTRAVENOUS
  Filled 2012-10-14 (×6): qty 1000

## 2012-10-14 MED ORDER — LEVALBUTEROL HCL 0.63 MG/3ML IN NEBU: 0.6300 mg | INHALATION_SOLUTION | RESPIRATORY_TRACT | Status: DC | PRN

## 2012-10-14 MED ORDER — POTASSIUM CHLORIDE 10 MEQ/100ML IV SOLN
10.0000 meq | INTRAVENOUS | Status: AC
  Administered 2012-10-14 (×2): 10 meq via INTRAVENOUS
  Filled 2012-10-14: qty 200

## 2012-10-14 MED ORDER — DEXTROSE 5 % IV SOLN
3.0000 g | Freq: Once | INTRAVENOUS | Status: AC
  Administered 2012-10-15: 3 g via INTRAVENOUS
  Filled 2012-10-14 (×2): qty 3000

## 2012-10-14 MED FILL — Hydralazine HCl Inj 20 MG/ML: INTRAMUSCULAR | Qty: 1 | Status: AC

## 2012-10-14 NOTE — Progress Notes (Signed)
PULMONARY  / CRITICAL CARE MEDICINE  Name: Joe Snow MRN: [id number] DOB: 30-Jun-1924    ADMISSION DATE:  10/11/2012 CONSULTATION DATE:  10/11/2012  REFERRING MD :  Rehab MD  BRIEF PATIENT DESCRIPTION: 79 yo with multiple strokes and dysphagia / difficulty handling secretions scheduled for trach / PEG placement.  LINES / TUBES:  CULTURES:  ANTIBIOTICS:  SUBJECTIVE:  No distress. No new complaints. RASS 0. Strong cough.   VITAL SIGNS: Temp:  [97.3 F (36.3 C)-98 F (36.7 C)] 97.9 F (36.6 C) (03/17 0800) Pulse Rate:  [57-86] 79 (03/17 1100) Resp:  [12-26] 22 (03/17 1100) BP: (127-187)/(61-108) 184/80 mmHg (03/17 1100) SpO2:  [93 %-99 %] 96 % (03/17 1100) Weight:  [82.1 kg (181 lb)] 82.1 kg (181 lb) (03/17 0600)  PHYSICAL EXAMINATION: General:  NAD Neuro:  RASS 0. MAEs HEENT:  [place]/AT, PERRL, EOMI Neck:  No JVD Cardiovascular:  RRR s M Lungs:  Clear anteriorly. Abdomen:  Soft, NT, NABS. Ext: No edema, warm.  LABS: BMET    Component Value Date/Time   NA 142 10/14/2012 0423   K 3.3* 10/14/2012 0423   CL 102 10/14/2012 0423   CO2 33* 10/14/2012 0423   GLUCOSE 183* 10/14/2012 0423   BUN 12 10/14/2012 0423   CREATININE 1.38* 10/14/2012 0423   CALCIUM 9.4 10/14/2012 0423   GFRNONAA 45* 10/14/2012 0423   GFRAA 52* 10/14/2012 0423    CBC    Component Value Date/Time   WBC 7.9 10/14/2012 0423   RBC 4.83 10/14/2012 0423   HGB 12.7* 10/14/2012 0423   HCT 40.6 10/14/2012 0423   PLT 180 10/14/2012 0423   MCV 84.1 10/14/2012 0423   MCH 26.3 10/14/2012 0423   MCHC 31.3 10/14/2012 0423   RDW 16.1* 10/14/2012 0423   LYMPHSABS 1.4 10/10/2012 0712   MONOABS 1.2* 10/10/2012 0712   EOSABS 0.2 10/10/2012 0712   BASOSABS 0.0 10/10/2012 0712    CXR: No new film  ASSESSMENT AND PLAN:  NEUROLOGIC A:  Bulbar stroke with dysphagia  P: PEG planned for 3/18 Rehab transfer after PEG   RESPIRATORY A:  Airway hygiene much improved P: No indication for trach - cancelled    CARDIAC A: Chronic AF. HTN  P: Xarelto held 10/10/12 ASA Hydralazine prn to maintain SBP < 160 mmHg   INFECTIOUS DISEASE A:  No active issues P:   RENAL A: Mild hypokalemia P: Replete K+ IVFs adjusted   HEMATOLOGIC A:  No issues P:   GASTROINTESTINAL A:  Dysphagia.  Esophageal candidiasis. P: Has completed 7 days fluconazole - D/C 3/17 PEG planned 31/8 - discussed with GI  ENDOCRINE A:  DM2. P:  Cont SSI    Billy Fischer, MD ; [facility] [phone].  After 5:30 PM or weekends, call [phone]   10/14/2012, 12:07 PM

## 2012-10-14 NOTE — Progress Notes (Signed)
Patient seen, examined, and I agree with the above documentation, including the assessment and plan. Plan for PEG placement tomorrow.  Will need abx prior to procedure. Anticoagulation with Xarelto has been off since 3/14 which is ample time before  Jennye Moccasin, PA-C has discussed the nature of the procedure, as well as the risks, benefits, and alternatives carefully and thoroughly with the patient's daughter. She agreed to proceed.

## 2012-10-14 NOTE — Progress Notes (Signed)
     [facility] Daily Rounding Note 10/14/2012, 12:01 PM  SUBJECTIVE:       Tired, sleeping a lot.  Somewhat agitated when briefly awake.  He is very hungry but had refused replacement of NGT last week and over weekend  OBJECTIVE:         Vital signs in last 24 hours:    Temp:  [97.3 F (36.3 C)-98 F (36.7 C)] 97.9 F (36.6 C) (03/17 0800) Pulse Rate:  [57-86] 79 (03/17 1100) Resp:  [12-26] 22 (03/17 1100) BP: (127-187)/(61-108) 184/80 mmHg (03/17 1100) SpO2:  [93 %-99 %] 96 % (03/17 1100) Weight:  [82.1 kg (181 lb)] 82.1 kg (181 lb) (03/17 0600)   General: looks unwell , comfortable   Heart: RRR Chest:  Abdomen: clear in front  Extremities: no CCE  Intake/Output from previous day: 03/16 0701 - 03/17 0700 In: 1975 [I.V.:1875; IV Piggyback:100] Out: 570 [Urine:570]  Intake/Output this shift: Total I/O In: 250 [I.V.:50; IV Piggyback:200] Out: 125 [Urine:125]  Lab Results:  Recent Labs  10/11/12 1454 10/12/12 0535 10/14/12 0423  WBC 11.1* 9.4 7.9  HGB 12.9* 11.7* 12.7*  HCT 41.5 36.5* 40.6  PLT 199 188 180   BMET  Recent Labs  10/11/12 1454 10/12/12 0535 10/14/12 0423  NA 140 140 142  K 3.7 3.7 3.3*  CL 99 99 102  CO2 32 33* 33*  GLUCOSE 192* 171* 183*  BUN 20 18 12   CREATININE 1.23 1.31 1.38*  CALCIUM 9.5 9.2 9.4   LFT  Recent Labs  10/11/12 1454  PROT 7.2  ALBUMIN 3.6  AST 54*  ALT 57*  ALKPHOS 96  BILITOT 0.7   PT/INR  Recent Labs  10/11/12 1454  LABPROT 13.6  INR 1.05   Hepatitis Panel No results found for this basename: HEPBSAG, HCVAB, HEPAIGM, HEPBIGM,  in the last 72 hours  Studies/Results: Dg Chest Port 1 View  10/12/2012  *RADIOLOGY REPORT*  Clinical Data: Follow-up respiratory failure.  Trach scheduled for 10/14/2012.  PORTABLE CHEST - 1 VIEW  Comparison: 10/09/2012  Findings: The heart is enlarged.  This is accentuated by the portable position and patient rotation.  There is mild perihilar bronchitic change.  No focal  consolidations or pleural effusions are identified.  There is minimal right base atelectasis.  A feeding tube has been removed.  IMPRESSION:  1. Cardiomegaly. 2.  Bronchitic changes.   Original Report Authenticated By: Norva Pavlov, M.D.     ASSESMENT: *  dysphagia  *  Difficulty handling secretions post cva. Improved to point that trach for today has been cancelled.  *  candida esophagitis, abnormal motion arytenoid on EGD 10/05/12.  Treated with Diflucan 3/14 - 3/17.   PLAN: *  PEG tomorrow 3/18 around 1 PM. preop abx.  Risks d/w pts daughter.  Has been off Xarelto since 3/14    LOS: 3 days   Jennye Moccasin  10/14/2012, 12:01 PM Pager: [phone]  Pager: [phone]

## 2012-10-14 NOTE — Progress Notes (Addendum)
INITIAL NUTRITION ASSESSMENT  DOCUMENTATION CODES Per approved criteria  -Not Applicable   INTERVENTION: 1. Once PEG ready for use recommend Glucerna 1.2 start @ 30 ml/hr and increase by 10 ml every 4 hours to goal rate of 70 ml/hr.  2. Add free water flushes of 200 ml QID - this will provide an additional 800 ml water daily  NUTRITION DIAGNOSIS: Inadequate oral intake related to inability to eat as evidenced by NPO status.   Goal: Intake to meet >90% of estimated nutrition needs.  Monitor:  weight trends, lab trends, I/O's, TF tolerance  Reason for Assessment: Positive Malnutrition Screening Tool   79 y.o. male  Admitting Dx: difficulty handling secretions scheduled for trach / PEG placement   ASSESSMENT: Nutrition hx obtained from wife by RD on acute admission 3/3, wife states that because of pt's GIB in December, pt has been off coumadin and trying other medications. Wife reports pt has had multiple adverse reactions to these medications and feels that since this time, pt's appetite has not recovered. According to pt, he doesn't want to eat because "nothing tastes good." Wife states pt drinks Glucerna at home.  Pt with ongoing elevated blood sugars.  Patient admitted 3/3 with R-sided weakness and facial droop. MRI of the brain 3/4 showed small acute non-hemorrhagic infarcts inferior aspect of the R cerebellum and R posterior lateral aspect of the medulla. Patient noted on 3/10 increased confusion and swallowing difficulties. F/u ST with findings of severe oral pharyngeal dysphagia and dysarthria. Swallow study completed with NPO rec's. NGT placed, pt received Jevity 1.2 at 65 ml/hr. There was findings of esophageal candidiasis and placed on Diflucan x 14 days.   Pt was transferred to rehab 3/10. Pt continued to have elevated blood sugars therefore enteral nutrition was changed to Glucerna 1.2. Pt's NGT came out Friday and pt refused to have it replaced over the weekend. Pt was  transferred to 3100 on 3/14 for trach and PEG placement. Per MD notes pt is doing better with his secretions and no longer needs a trach but is scheduled for PEG on 3/18.   Height: Ht Readings from Last 1 Encounters:  10/11/12 5' 11.5" (1.816 m)    Weight: Wt Readings from Last 1 Encounters:  10/14/12 181 lb (82.1 kg)    Ideal Body Weight: 175 lb  % Ideal Body Weight: 113%  Wt Readings from Last 10 Encounters:  10/14/12 181 lb (82.1 kg)  10/11/12 204 lb 2.3 oz (92.6 kg)  09/30/12 197 lb 8.5 oz (89.6 kg)  09/30/12 197 lb 8.5 oz (89.6 kg)    Usual Body Weight: 200 lb  % Usual Body Weight: 99%  BMI:  Body mass index is 24.89 kg/(m^2). Overweight.  Estimated Nutritional Needs: Kcal: 1900 - 2100 Protein: 90 - 105 grams Fluid: 1.9 - 2.1 liters  Skin: intact  Diet Order: NPO  EDUCATION NEEDS: -No education needs identified at this time   Intake/Output Summary (Last 24 hours) at 10/14/12 1426 Last data filed at 10/14/12 1230  Gross per 24 hour  Intake   1675 ml  Output    380 ml  Net   1295 ml    Last BM: 3/10, last documented  Labs:   Recent Labs Lab 10/10/12 0712 10/11/12 1454 10/12/12 0535 10/14/12 0423  NA 141 140 140 142  K 3.4* 3.7 3.7 3.3*  CL 100 99 99 102  CO2 32 32 33* 33*  BUN 18 20 18 12   CREATININE 1.24 1.23 1.31 1.38*  CALCIUM 9.0 9.5 9.2 9.4  MG  --  2.1  --   --   PHOS  --  2.4  --   --   GLUCOSE 231* 192* 171* 183*    CBG (last 3)   Recent Labs  10/14/12 0331 10/14/12 0746 10/14/12 1152  GLUCAP 173* 159* 180*    Scheduled Meds: . insulin aspart  0-9 Units Subcutaneous Q4H  . levothyroxine  44 mcg Intravenous Daily    Continuous Infusions: . dextrose 5 % and 0.45 % NaCl with KCl 40 mEq/L 50 mL/hr at 10/14/12 1000    Past Medical History  Diagnosis Date  . Hypertension associated with diabetes   . Diabetes   . Atrial fibrillation   . Embolic stroke involving left vertebral artery 10/02/2012  . Dysphagia,  pharyngeal-neurogenic 10/05/2012    Past Surgical History  Procedure Laterality Date  . Prostate surgery N/A 1995    estimate  . Tonsillectomy Bilateral 1930s  . Esophagogastroduodenoscopy N/A 10/05/2012    Procedure: ESOPHAGOGASTRODUODENOSCOPY (EGD);  Surgeon: Iva Boop, MD;  Location: [facility] ENDOSCOPY;  Service: Endoscopy;  Laterality: N/A;    Kendell Bane RD, LDN, CNSC [phone] Pager [phone] After Hours Pager

## 2012-10-14 NOTE — Progress Notes (Signed)
Hypokalemia   K replaced  

## 2012-10-15 ENCOUNTER — Encounter (HOSPITAL_COMMUNITY): Payer: Self-pay | Admitting: *Deleted

## 2012-10-15 ENCOUNTER — Encounter (HOSPITAL_COMMUNITY)
Admission: AD | Disposition: A | Discharge status: Rehabilitation Facility | Payer: Self-pay | Source: Ambulatory Visit | Attending: Internal Medicine

## 2012-10-15 DIAGNOSIS — E119 Type 2 diabetes mellitus without complications: Secondary | ICD-10-CM

## 2012-10-15 HISTORY — PX: PEG PLACEMENT: SHX5437

## 2012-10-15 LAB — BASIC METABOLIC PANEL
BUN: 11 mg/dL (ref 6–23)
Chloride: 102 mEq/L (ref 96–112)
Creatinine, Ser: 1.35 mg/dL (ref 0.50–1.35)
GFR calc Af Amer: 53 mL/min — ABNORMAL LOW (ref >90)
GFR calc non Af Amer: 46 mL/min — ABNORMAL LOW (ref >90)
Glucose, Bld: 174 mg/dL — ABNORMAL HIGH (ref 70–99)

## 2012-10-15 LAB — GLUCOSE, CAPILLARY
Glucose-Capillary: 188 mg/dL — ABNORMAL HIGH (ref 70–99)
Glucose-Capillary: 198 mg/dL — ABNORMAL HIGH (ref 70–99)

## 2012-10-15 SURGERY — INSERTION, PEG TUBE
Anesthesia: Moderate Sedation

## 2012-10-15 MED ORDER — RIVAROXABAN 15 MG PO TABS
15.0000 mg | ORAL_TABLET | Freq: Every day | ORAL | Status: DC
  Filled 2012-10-15: qty 1

## 2012-10-15 MED ORDER — MIDAZOLAM HCL 5 MG/ML IJ SOLN
INTRAMUSCULAR | Status: AC
  Filled 2012-10-15: qty 2

## 2012-10-15 MED ORDER — FENTANYL CITRATE 0.05 MG/ML IJ SOLN
INTRAMUSCULAR | Status: DC | PRN
  Administered 2012-10-15: 25 ug via INTRAVENOUS
  Administered 2012-10-15 (×3): 12.5 ug via INTRAVENOUS

## 2012-10-15 MED ORDER — SODIUM CHLORIDE 0.9 % IV SOLN: INTRAVENOUS | Status: DC

## 2012-10-15 MED ORDER — MIDAZOLAM HCL 10 MG/2ML IJ SOLN
INTRAMUSCULAR | Status: DC | PRN
  Administered 2012-10-15 (×3): 1 mg via INTRAVENOUS
  Administered 2012-10-15: 2 mg via INTRAVENOUS

## 2012-10-15 MED ORDER — FENTANYL CITRATE 0.05 MG/ML IJ SOLN
INTRAMUSCULAR | Status: AC
  Filled 2012-10-15: qty 4

## 2012-10-15 MED ORDER — POTASSIUM CHLORIDE 20 MEQ/15ML (10%) PO LIQD
40.0000 meq | Freq: Two times a day (BID) | ORAL | Status: AC
  Administered 2012-10-15 (×2): 40 meq
  Filled 2012-10-15 (×3): qty 30

## 2012-10-15 NOTE — Op Note (Signed)
[name] [address] [place], [address]   OPERATIVE PROCEDURE REPORT  PATIENT: Joe Snow, Joe Snow  MR#: [id number] BIRTHDATE :1923-10-16 , 88  yrs. old GENDER: Male ENDOSCOPIST: Beverley Fiedler, MD PROCEDURE DATE:  10/15/2012 PROCEDURE:   EGD with PEG placement ASA CLASS:   Class III INDICATIONS:1.  dysphagia.   2.  feeding difficulties. MEDICATIONS: These medications were titrated to patient response per physician's verbal order, Fentanyl 62.5 mcg IV, and Versed 5 mg IV  TOPICAL ANESTHETIC: Cetacaine Spray  DESCRIPTION OF PROCEDURE:  After the risks benefits and alternatives of the procedure were thoroughly explained, informed consent was obtained.  The EG-[id number] ([id number])  endoscope was introduced through the mouth  and advanced to the second portion of the duodenum , without limitation.   The instrument was slowly withdrawn as the mucosa was fully examined.  ESOPHAGUS: The mucosa of the esophagus appeared normal.  STOMACH: The mucosa of the stomach appeared normal.  DUODENUM: The duodenal mucosa showed no abnormalities.  The stomach was then inflated with air, and by a combination of transillumination and manual palpation, the site for the gastrostomy tube placement was selected and marked on the anterior abdominal wall.  The skin of the anterior abdomen was surgically prepped and draped with sterile towels.  Utilizing strict sterile technique, the selected site was then anesthetized with 1% xylocaine by injection into the skin and subcutaneous tissue.  A 1 cm incision was made through the skin and subcutaneous tissue, and the needle/cannula assembly was then passed through the abdominal wall and through the anterior wall of the stomach, maintaining visualization with the endoscope.  A snare device previously placed through the instrument channel was then opened and placed around the cannula, the needle was removed, and the insertion wire was  passed through the cannula and into the stomach lumen.  The snare was then loosened from the cannula, and repositioned to snare the insertion wire.  The snare was then pulled up to the endoscope distal tip, and the scope was then withdrawn bringing with it the snare and insertion wire.  The insertion wire was then released from the snare, and then loop-attached to the [employer] Pull PEG   The G-tube insertion site was then cleansed once again, and the external bolster was placed over the tube to secure it to the abdominal wall.  A sterile dressing was then applied, and the procedure terminated.  COMPLICATIONS: There were no complications.  ENDOSCOPIC IMPRESSION: 1.   The mucosa of the esophagus appeared normal 2.   The mucosa of the stomach appeared normal 3.   The duodenal mucosa showed no abnormalities 4.   Successful placement of percutaneous endoscopic gastrostomy  RECOMMENDATIONS: 1.  Keep abdominal binder in place to prevent inadvertent removal 2.  Okay to use PEG tube for medication and water in 4 hours; okay for tube feeds tomorrow after tube checked by GI team  eSigned:  Beverley Fiedler, MD 10/15/2012 8:28 AM CC:The Patient

## 2012-10-15 NOTE — Interval H&P Note (Signed)
History and Physical Interval Note: No interval change in history.  Xarelto off since 10/11/12.  For PEG placement The nature of the procedure, as well as the risks, benefits, and alternatives were carefully and thoroughly reviewed with the patient. Ample time for discussion and questions allowed. The patient understood, was satisfied, and agreed to proceed.  Pre-procedure abx given (Ancef 3g)   10/15/2012 7:39 AM  Joe Snow  has presented today for surgery, with the diagnosis of dysphagia in pt s/p CVA  The various methods of treatment have been discussed with the patient and family. After consideration of risks, benefits and other options for treatment, the patient has consented to  Procedure(s): PERCUTANEOUS ENDOSCOPIC GASTROSTOMY (PEG) PLACEMENT (N/A) as a surgical intervention .  The patient's history has been reviewed, patient examined, no change in status, stable for surgery.  I have reviewed the patient's chart and labs.  Questions were answered to the patient's satisfaction.     Joe Snow M

## 2012-10-15 NOTE — Progress Notes (Signed)
PULMONARY  / CRITICAL CARE MEDICINE  Name: Joe Snow MRN: [id number] DOB: 07-22-1924    ADMISSION DATE:  10/11/2012 CONSULTATION DATE:  10/11/2012  REFERRING MD :  Rehab MD  BRIEF PATIENT DESCRIPTION: 79 yo with multiple strokes and dysphagia / difficulty handling secretions scheduled for trach / PEG placement.  Secretion mgmt improved and trach cancelled for now. PEG placed 3/18  3/8 EGD Leone Payor): INDICATIONS: Dysphagia. ? food impaction. FINDINGS: White exudates consistent with candidiasis in the esophagus  3/18 PEG placement (Pyrtle). Findings: Nl esophageal mucosa. Successful placement of PEG tube  LINES / TUBES: PEG 3/18 >>   CULTURES:  ANTIBIOTICS: Fluconazole 3/10 >> 3/17 (suspected esophageal candidiasis)   SUBJECTIVE:  No distress. No new complaints. RASS 0. Strong cough.  VITAL SIGNS: Temp:  [97.5 F (36.4 C)-98.8 F (37.1 C)] 98.6 F (37 C) (03/18 1230) Pulse Rate:  [72-99] 89 (03/18 1230) Resp:  [11-24] 16 (03/18 1230) BP: (140-198)/(62-127) 154/97 mmHg (03/18 1230) SpO2:  [92 %-100 %] 94 % (03/18 1230)  PHYSICAL EXAMINATION: General:  NAD Neuro:  RASS 0. MAEs HEENT:  /AT, PERRL, EOMI Neck:  No JVD Cardiovascular:  RRR s M Lungs:  Clear anteriorly. Abdomen:  Soft, NT, NABS. Ext: No edema, warm.  LABS: BMET    Component Value Date/Time   NA 141 10/15/2012 0315   K 3.0* 10/15/2012 0315   CL 102 10/15/2012 0315   CO2 28 10/15/2012 0315   GLUCOSE 174* 10/15/2012 0315   BUN 11 10/15/2012 0315   CREATININE 1.35 10/15/2012 0315   CALCIUM 9.3 10/15/2012 0315   GFRNONAA 46* 10/15/2012 0315   GFRAA 53* 10/15/2012 0315    CBC    Component Value Date/Time   WBC 7.9 10/14/2012 0423   RBC 4.83 10/14/2012 0423   HGB 12.7* 10/14/2012 0423   HCT 40.6 10/14/2012 0423   PLT 180 10/14/2012 0423   MCV 84.1 10/14/2012 0423   MCH 26.3 10/14/2012 0423   MCHC 31.3 10/14/2012 0423   RDW 16.1* 10/14/2012 0423   LYMPHSABS 1.4 10/10/2012 0712   MONOABS 1.2* 10/10/2012  0712   EOSABS 0.2 10/10/2012 0712   BASOSABS 0.0 10/10/2012 0712    CXR: No new film  ASSESSMENT AND PLAN: A:  Bulbar stroke with dysphagia  P: PEG planned for 3/18 Rehab transfer after PEG  A:  Airway hygiene much improved P: Cont current mgmt. No trach for now but remains an option in future if deemed necessary  CARDIAC A: Chronic AF. HTN  P: Xarelto held 10/10/12 for PEG. Resumed 3/18 Cont ASA Cont hydralazine prn to maintain SBP < 160 mmHg   RENAL A: Mild hypokalemia P: Replete K+ Cont IVFs until enteral nutrition started   GASTROINTESTINAL A:  Dysphagia.  Esophageal candidiasis, resolved after 7 days fluconazole P: Has completed 7 days fluconazole - D/C 3/17 PEG placed 3/18   OK to begin using 3/19 per GI   ENDOCRINE A:  DM2. P:  Cont SSI   Transfer out of ICU to Tele. TRH to assume care as of 3/19 and PCCM to sign off. Please call if we can be of further assistance   Billy Fischer, MD ; [facility] [phone].  After 5:30 PM or weekends, call [phone]   10/15/2012, 3:49 PM

## 2012-10-15 NOTE — Progress Notes (Signed)
Kelly Cooper, PTA [phone] 10/15/2012  

## 2012-10-15 NOTE — Progress Notes (Signed)
Orthopedic Tech Progress Note Patient Details:  Joe Snow 03/17/1924 [id number]  Ortho Devices Type of Ortho Device: Abdominal binder Ortho Device/Splint Interventions: Ordered   Shawnie Pons 10/15/2012, 1:56 PM

## 2012-10-16 ENCOUNTER — Encounter (HOSPITAL_COMMUNITY): Payer: Self-pay | Admitting: Internal Medicine

## 2012-10-16 ENCOUNTER — Inpatient Hospital Stay (HOSPITAL_COMMUNITY): Payer: Medicare Other

## 2012-10-16 DIAGNOSIS — Z431 Encounter for attention to gastrostomy: Secondary | ICD-10-CM

## 2012-10-16 LAB — GLUCOSE, CAPILLARY
Glucose-Capillary: 164 mg/dL — ABNORMAL HIGH (ref 70–99)
Glucose-Capillary: 168 mg/dL — ABNORMAL HIGH (ref 70–99)
Glucose-Capillary: 167 mg/dL — ABNORMAL HIGH (ref 70–99)

## 2012-10-16 MED ORDER — FREE WATER
200.0000 mL | Status: DC
  Administered 2012-10-16 – 2012-10-17 (×7): 200 mL

## 2012-10-16 MED ORDER — GLUCERNA 1.2 CAL PO LIQD
1000.0000 mL | ORAL | Status: DC
  Administered 2012-10-16: 1000 mL
  Filled 2012-10-16: qty 1000

## 2012-10-16 MED ORDER — LORAZEPAM 2 MG/ML IJ SOLN
0.5000 mg | Freq: Four times a day (QID) | INTRAMUSCULAR | Status: DC | PRN
  Administered 2012-10-17: 0.5 mg via INTRAVENOUS
  Filled 2012-10-16: qty 1

## 2012-10-16 MED ORDER — RIVAROXABAN 15 MG PO TABS
15.0000 mg | ORAL_TABLET | Freq: Every day | ORAL | Status: DC
  Administered 2012-10-17: 15 mg via ORAL
  Filled 2012-10-16: qty 1

## 2012-10-16 MED ORDER — POTASSIUM CHLORIDE 20 MEQ/15ML (10%) PO LIQD
40.0000 meq | Freq: Three times a day (TID) | ORAL | Status: AC
  Administered 2012-10-16 (×2): 40 meq
  Filled 2012-10-16 (×2): qty 30

## 2012-10-16 MED ORDER — HYDRALAZINE HCL 20 MG/ML IJ SOLN
10.0000 mg | INTRAMUSCULAR | Status: DC | PRN
  Administered 2012-10-16 (×3): 10 mg via INTRAVENOUS
  Filled 2012-10-16 (×2): qty 1

## 2012-10-16 MED ORDER — GLUCERNA 1.2 CAL PO LIQD
1000.0000 mL | ORAL | Status: DC
  Filled 2012-10-16 (×4): qty 1000

## 2012-10-16 NOTE — Care Management Note (Unsigned)
    Page 1 of 1   10/16/2012     10:28:59 AM   CARE MANAGEMENT NOTE 10/16/2012  Patient:  Joe Snow, Joe Snow   Account Number:  [id number]  Date Initiated:  10/15/2012  Documentation initiated by:  Carlyle Lipa  Subjective/Objective Assessment:   Bulbar stroke readmitted from CIR for trach/PEG Placement     Action/Plan:   back to CIR after procedures   Anticipated DC Date:  10/17/2012   Anticipated DC Plan:  IP REHAB FACILITY         Choice offered to / List presented to:             Status of service:  In process, will continue to follow Medicare Important Message given?   (If response is "NO", the following Medicare IM given date fields will be blank) Date Medicare IM given:   Date Additional Medicare IM given:    Discharge Disposition:    Per UR Regulation:  Reviewed for med. necessity/level of care/duration of stay  If discussed at Long Length of Stay Meetings, dates discussed:   10/17/2012    Comments:  10-16-12 1024 Tomi Bamberger, RN,BSN [phone] CM did speak to wife/ pt at bedside and they are agreeable to see if pt is eligible for CIR. CM placed CIR consult along with PT/OT. CM did call Rehab Admission Coordinator- Trish Mage, RN to make her aware of consult being placed. Pt is post peg tube placement 10-15-12. Per MD notes: Secretion mgmt improved and trach cancelled for now. CM will continue to monitor for disposition needs.

## 2012-10-16 NOTE — Progress Notes (Signed)
[facility] PROGRESS NOTE  Joe Snow WUJ:[id number] DOB: [birth year] DOA: 10/11/2012 PCP: Sherrie Mustache, MD  Assessment/Plan:   Brief History:  79 yo with multiple strokes and dysphagia / difficulty handling secretions scheduled for trach / PEG placement. Secretion mgmt improved and trach cancelled for now. PEG placed 3/18  1. Bulbar stroke with dysphagia ; s/p PEG placement and started feed on 3/19. If he is able to tolerate feeds able to go to CIR in am.   2. Airway hygiene much improved   Cont current mgmt. No trach for now but remains an option in future if deemed necessary.  3. Chronic AF Xarelto held 10/10/12 for PEG. Resumed 3/18  Cont ASA  Cont hydralazine prn to maintain SBP < 160 mmHg  4. Mild hypokalemia Replete as needed.   5. Dysphagia.  Esophageal candidiasis, resolved after 7 days fluconazole.  6. Diabetes Mellitus: SSI.      Code Status: full code Family Communication: wife at bedside Disposition Plan: CIR possibly tomorrow, if he is able to tolerate the feeds.    Consultants:  PCCM  GI  Procedures: 3/8 EGD Leone Payor): INDICATIONS: Dysphagia. ? food impaction. FINDINGS: White exudates consistent with candidiasis in the esophagus  3/18 PEG placement (Pyrtle). Findings: Nl esophageal mucosa. Successful placement of PEG tube     HPI/Subjective: REQUESTING FOOD.  Wife at bedside. No new complaints.   Objective: Filed Vitals:   10/16/12 0556 10/16/12 0757 10/16/12 0845 10/16/12 0928  BP: 104/70 195/101 189/116 180/86  Pulse: 64 90 80   Temp: 98.4 F (36.9 C) 98.4 F (36.9 C) 98 F (36.7 C)   TempSrc: Oral  Oral   Resp: 18 18 18    Height:      Weight:      SpO2: 94% 96% 98%     Intake/Output Summary (Last 24 hours) at 10/16/12 1042 Last data filed at 10/16/12 0752  Gross per 24 hour  Intake    250 ml  Output    325 ml  Net    -75 ml   Filed Weights   10/11/12 1100 10/11/12 1200 10/14/12 0600  Weight: 92.6 kg (204 lb  2.3 oz) 92.6 kg (204 lb 2.3 oz) 82.1 kg (181 lb)    Exam:  General: NAD  Neck: No JVD  Cardiovascular: RRR  Lungs: CTAB Abdomen: Soft, NT, ND  Ext: No edema, warm.   Data Reviewed: Basic Metabolic Panel:  Recent Labs Lab 10/10/12 0712 10/11/12 1454 10/12/12 0535 10/14/12 0423 10/15/12 0315  NA 141 140 140 142 141  K 3.4* 3.7 3.7 3.3* 3.0*  CL 100 99 99 102 102  CO2 32 32 33* 33* 28  GLUCOSE 231* 192* 171* 183* 174*  BUN 18 20 18 12 11   CREATININE 1.24 1.23 1.31 1.38* 1.35  CALCIUM 9.0 9.5 9.2 9.4 9.3  MG  --  2.1  --   --   --   PHOS  --  2.4  --   --   --    Liver Function Tests:  Recent Labs Lab 10/10/12 0712 10/11/12 1454  AST 66* 54*  ALT 60* 57*  ALKPHOS 94 96  BILITOT 0.7 0.7  PROT 6.7 7.2  ALBUMIN 3.3* 3.6   No results found for this basename: LIPASE, AMYLASE,  in the last 168 hours No results found for this basename: AMMONIA,  in the last 168 hours CBC:  Recent Labs Lab 10/10/12 0712 10/11/12 1454 10/12/12 0535 10/14/12 0423  WBC 7.8 11.1* 9.4 7.9  NEUTROABS  4.9  --   --   --   HGB 12.4* 12.9* 11.7* 12.7*  HCT 40.1 41.5 36.5* 40.6  MCV 86.4 85.9 84.5 84.1  PLT 176 199 188 180   Cardiac Enzymes: No results found for this basename: CKTOTAL, CKMB, CKMBINDEX, TROPONINI,  in the last 168 hours BNP (last 3 results) No results found for this basename: PROBNP,  in the last 8760 hours CBG:  Recent Labs Lab 10/15/12 1627 10/15/12 1929 10/15/12 2351 10/16/12 0349 10/16/12 0733  GLUCAP 168* 163* 138* 131* 142*    No results found for this or any previous visit (from the past 240 hour(s)).   Studies: No results found.  Scheduled Meds: . insulin aspart  0-9 Units Subcutaneous Q4H  . levothyroxine  44 mcg Intravenous Daily  . potassium chloride  40 mEq Per Tube TID  . [START ON 10/17/2012] rivaroxaban  15 mg Oral Q supper   Continuous Infusions: . dextrose 5 % and 0.45 % NaCl with KCl 40 mEq/L 50 mL/hr at 10/15/12 2257  . feeding  supplement (GLUCERNA 1.2 CAL)      Active Problems:   * No active hospital problems. Kathlen Mody  [facility] Pager [phone]. If 7PM-7AM, please contact night-coverage at [email or web address], password [facility] 10/16/2012, 10:42 AM  LOS: 5 days

## 2012-10-16 NOTE — Progress Notes (Signed)
     [facility] Daily Rounding Note 10/16/2012, 8:40 AM  SUBJECTIVE:       He is very hungry.  No abdominal pain except for hunger.  No nausea  OBJECTIVE:         Vital signs in last 24 hours:    Temp:  [97.5 F (36.4 C)-98.6 F (37 C)] 98.4 F (36.9 C) (03/19 0757) Pulse Rate:  [64-96] 90 (03/19 0757) Resp:  [14-23] 18 (03/19 0757) BP: (104-195)/(70-115) 195/101 mmHg (03/19 0757) SpO2:  [92 %-100 %] 96 % (03/19 0757) Last BM Date: 10/06/12 General: looks better, more alert   Heart: RRR Chest: clear B.  Vocal weakness/hoarseness. Abdomen: soft, NT, ND, scant dry blood at PEG site, no fresh blood.  BS hypoactive.   Extremities: no CCE Neuro/Psych:  Pleasant, a bit agitated.  Appropriate.   Intake/Output from previous day: 03/18 0701 - 03/19 0700 In: 250 [I.V.:250] Out: 325 [Urine:325]  Intake/Output this shift:    Lab Results:  Recent Labs  10/14/12 0423  WBC 7.9  HGB 12.7*  HCT 40.6  PLT 180   BMET  Recent Labs  10/14/12 0423 10/15/12 0315  NA 142 141  K 3.3* 3.0*  CL 102 102  CO2 33* 28  GLUCOSE 183* 174*  BUN 12 11  CREATININE 1.38* 1.35  CALCIUM 9.4 9.3    ASSESMENT: *  One day s/p PEG placed 3/18 due to dysphagia/difficulty handling secretions post multiple CVAs. Marland Kitchen     PLAN: *  Start Glucerna 1.2  @ 30 ml/hr and increase by 10 ml every 4 hours to goal rate of 70 ml/hr.  *  Xarelto;  Per Dr Lauro Franklin request I changed restart of this to tomorrow PM    LOS: 5 days   Joe Snow  10/16/2012, 8:40 AM Pager: [phone]

## 2012-10-16 NOTE — Evaluation (Signed)
Physical Therapy Evaluation Patient Details Name: Joe Snow MRN: [id number] DOB: 1923-10-08 Today's Date: 10/16/2012 Time: 0865-7846 PT Time Calculation (min): 33 min  PT Assessment / Plan / Recommendation Clinical Impression  pt presents with Resp Distress and Peg Placement.  pt transfer from CIR where he was there for only a couple days after a CVA.  pt would benefit from returning to CIR for continued rehab.      PT Assessment  Patient needs continued PT services    Follow Up Recommendations  CIR    Does the patient have the potential to tolerate intense rehabilitation      Barriers to Discharge None      Equipment Recommendations  Rolling walker with 5" wheels    Recommendations for Other Services Rehab consult   Frequency Min 4X/week    Precautions / Restrictions Precautions Precautions: Fall Precaution Comments: Abdominal Binder Restrictions Weight Bearing Restrictions: No   Pertinent Vitals/Pain Denies pain.  Pt with frequent coughing up secretions during session.        Mobility  Bed Mobility Bed Mobility: Sitting - Scoot to Edge of Bed;Supine to Sit Supine to Sit: 4: Min assist;With rails;HOB elevated Sitting - Scoot to Edge of Bed: 5: Supervision Details for Bed Mobility Assistance: MinA with trunk only.  Mildly unsteady with initally difficulty finding midline in sitting.   Transfers Transfers: Sit to Stand;Stand to Sit Sit to Stand: 1: +2 Total assist;With upper extremity assist;From bed Sit to Stand: Patient Percentage: 70% Stand to Sit: 1: +2 Total assist;With upper extremity assist;To chair/3-in-1 Stand to Sit: Patient Percentage: 70% Details for Transfer Assistance: cues for UE use, increased BOS, attending to balance deficit.   Ambulation/Gait Ambulation/Gait Assistance: 1: +2 Total assist Ambulation/Gait: Patient Percentage: 80% Ambulation Distance (Feet): 10 Feet Assistive device: 2 person hand held assist Ambulation/Gait Assistance  Details: 2 people utilized for safety and chair follow.  pt tends to lean to R side and has difficulty wt shifting to L side.  pt with narrow BOS and tends to keep side stepping R even with cueing for LE positioning.   Gait Pattern: Step-to pattern;Decreased step length - right;Decreased weight shift to left;Ataxic;Lateral trunk lean to right;Narrow base of support Stairs: No Wheelchair Mobility Wheelchair Mobility: No    Exercises     PT Diagnosis: Difficulty walking  PT Problem List: Decreased strength;Decreased activity tolerance;Decreased balance;Decreased mobility;Decreased coordination;Decreased cognition;Decreased knowledge of use of DME;Decreased safety awareness PT Treatment Interventions: DME instruction;Gait training;Stair training;Functional mobility training;Therapeutic activities;Therapeutic exercise;Balance training;Neuromuscular re-education;Cognitive remediation;Patient/family education   PT Goals Acute Rehab PT Goals PT Goal Formulation: With patient/family Time For Goal Achievement: 10/30/12 Potential to Achieve Goals: Good Pt will go Supine/Side to Sit: with modified independence;with HOB 0 degrees PT Goal: Supine/Side to Sit - Progress: Goal set today Pt will go Sit to Supine/Side: with modified independence;with HOB 0 degrees PT Goal: Sit to Supine/Side - Progress: Goal set today Pt will go Sit to Stand: with supervision PT Goal: Sit to Stand - Progress: Goal set today Pt will go Stand to Sit: with supervision;with upper extremity assist PT Goal: Stand to Sit - Progress: Goal set today Pt will Transfer Bed to Chair/Chair to Bed: with supervision PT Transfer Goal: Bed to Chair/Chair to Bed - Progress: Goal set today Pt will Ambulate: 51 - 150 feet;with min assist;with rolling walker PT Goal: Ambulate - Progress: Goal set today Pt will Go Up / Down Stairs: 3-5 stairs;with least restrictive assistive device;with min assist PT Goal: Up/Down Stairs -  Progress: Goal set  today  Visit Information  Last PT Received On: 10/16/12 Assistance Needed: +2 PT/OT Co-Evaluation/Treatment: Yes    Subjective Data  Subjective: Let's do this.   Patient Stated Goal: Dance   Prior Functioning  Home Living Lives With: Spouse Available Help at Discharge: Family Type of Home: House Home Access: Stairs to enter Secretary/administrator of Steps: 4 Entrance Stairs-Rails: None Home Layout: Two level;Able to live on main level with bedroom/bathroom Alternate Level Stairs-Number of Steps: 20; 15 to landing + 5 more Alternate Level Stairs-Rails: Left Bathroom Shower/Tub: Health visitor: Handicapped height Bathroom Accessibility: Yes How Accessible: Accessible via wheelchair;Accessible via walker Home Adaptive Equipment: None Additional Comments: Home remodeled Prior Function Level of Independence: Independent Able to Take Stairs?: Yes Driving: Yes Vocation: Part time employment Communication Communication: HOH Dominant Hand: Right    Cognition  Cognition Overall Cognitive Status: Impaired Area of Impairment: Memory;Safety/judgement Arousal/Alertness: Awake/alert Orientation Level: Appears intact for tasks assessed Behavior During Session: [facility] for tasks performed Memory Deficits: pt has some difficulty with STM Safety/Judgement: Decreased safety judgement for tasks assessed;Impulsive;Decreased awareness of need for assistance    Extremity/Trunk Assessment Right Lower Extremity Assessment RLE ROM/Strength/Tone: Deficits RLE ROM/Strength/Tone Deficits: 4/5 RLE Sensation: WFL - Light Touch RLE Coordination: Deficits RLE Coordination Deficits: decreased coordination right leg for heel to shin test right compared to left.  Decreased functional coordination while stepping around to chair (ataxic type movements, decreased ability to place foot where he wants it.   Left Lower Extremity Assessment LLE ROM/Strength/Tone: WFL for tasks assessed LLE  Sensation: WFL - Light Touch LLE Coordination: WFL - gross/fine motor   Balance Balance Balance Assessed: Yes Static Standing Balance Static Standing - Balance Support: Bilateral upper extremity supported;Left upper extremity supported;Right upper extremity supported;No upper extremity supported Static Standing - Level of Assistance: 1: +2 Total assist (80%) Static Standing - Comment/# of Minutes: pt able to stand at sink utilizing counter top for Bil and unilateral UE support progressing to no UE support.  cues needed for attending to balance, R lateral lean and increasing BOS.    End of Session PT - End of Session Equipment Utilized During Treatment: Gait belt Activity Tolerance: Patient tolerated treatment well Patient left: in chair;with call bell/phone within reach;with family/visitor present Nurse Communication: Mobility status  GP     Sunny Schlein, [place] [phone] 10/16/2012, 3:00 PM

## 2012-10-16 NOTE — Progress Notes (Signed)
About 30 minutes after administering KCL solution diluted with 4 oz of water via PEG tube to the patient sitting greater than 30 degrees in bed, patient reported coughing spell and spit up some of the potassium solution.  Patient instructed to take deep breaths and hearty coughs.  Patient suctioned and oxygen saturation was 97% on room air. Dr. Blake Divine notified and instructed if this happens again to pause tube feedings for a couple of hours.  Will continue to monitor. [place], Mitzi Hansen

## 2012-10-16 NOTE — Progress Notes (Signed)
Patient seen, examined, and I agree with the above documentation, including the assessment and plan. Okay to fully use PEG tube Agree with holding Xarelto another 24 hour before restarting given recent PEG placement

## 2012-10-16 NOTE — Progress Notes (Signed)
NUTRITION CONSULT/FOLLOW UP  Intervention:    Initiate Glucerna 1.2 formula @ 30 ml/hr and increase by 10 ml every 4 hours to goal rate of 70 ml/hr RD to follow for nutrition care plan  Nutrition Dx:   Inadequate oral intake related to inability to eat as evidenced by NPO status, ongoing  Goal:   EN to meet >/= 90% of estimated nutrition needs, currently unmet  Monitor:   EN regimen & tolerance, weight, labs, I/O's  Assessment:   Patient s/p PEG tube placement 3/18.  Glucerna 1.2 formula initiation & management orders in place.  RD to add Adult Enteral Nutrition Protocol order set.  Height: Ht Readings from Last 1 Encounters:  10/11/12 5' 11.5" (1.816 m)    Weight Status:   Wt Readings from Last 1 Encounters:  10/14/12 181 lb (82.1 kg)    Re-estimated needs:  Kcal: 1900-2100 Protein: 90-105 gm Fluid: 1.9-2.1 L  Skin: Intact  Diet Order: NPO   Intake/Output Summary (Last 24 hours) at 10/16/12 1052 Last data filed at 10/16/12 0752  Gross per 24 hour  Intake    250 ml  Output    325 ml  Net    -75 ml    Last BM: 3/9  Labs:   Recent Labs Lab 10/10/12 0712 10/11/12 1454 10/12/12 0535 10/14/12 0423 10/15/12 0315  NA 141 140 140 142 141  K 3.4* 3.7 3.7 3.3* 3.0*  CL 100 99 99 102 102  CO2 32 32 33* 33* 28  BUN 18 20 18 12 11   CREATININE 1.24 1.23 1.31 1.38* 1.35  CALCIUM 9.0 9.5 9.2 9.4 9.3  MG  --  2.1  --   --   --   PHOS  --  2.4  --   --   --   GLUCOSE 231* 192* 171* 183* 174*    CBG (last 3)   Recent Labs  10/15/12 2351 10/16/12 0349 10/16/12 0733  GLUCAP 138* 131* 142*    Scheduled Meds: . insulin aspart  0-9 Units Subcutaneous Q4H  . levothyroxine  44 mcg Intravenous Daily  . potassium chloride  40 mEq Per Tube TID  . [START ON 10/17/2012] rivaroxaban  15 mg Oral Q supper    Continuous Infusions: . dextrose 5 % and 0.45 % NaCl with KCl 40 mEq/L 50 mL/hr at 10/15/12 2257  . feeding supplement (GLUCERNA 1.2 CAL)      Maureen Chatters, RD, LDN Pager #: [phone] After-Hours Pager #: [phone]

## 2012-10-16 NOTE — Evaluation (Signed)
Occupational Therapy Evaluation Patient Details Name: Joe Snow MRN: [id number] DOB: 1924/01/26 Today's Date: 10/16/2012 Time: 8295-6213 OT Time Calculation (min): 37 min  OT Assessment / Plan / Recommendation Clinical Impression  Pt presents with resp distress and peg placement.  Admitted from CIR s/p CVA. Recommend return to CIR when medically stable. Will continue to follow acutely.    OT Assessment  Patient needs continued OT Services    Follow Up Recommendations  CIR    Barriers to Discharge None    Equipment Recommendations  3 in 1 bedside comode    Recommendations for Other Services Rehab consult  Frequency  Min 3X/week    Precautions / Restrictions Precautions Precautions: Fall Precaution Comments: Abdominal Binder Restrictions Weight Bearing Restrictions: No   Pertinent Vitals/Pain See vitals    ADL  Upper Body Bathing: Simulated;Minimal assistance Where Assessed - Upper Body Bathing: Unsupported sitting Lower Body Bathing: Simulated;Moderate assistance Where Assessed - Lower Body Bathing: Supported sit to stand Upper Body Dressing: Simulated;Minimal assistance Where Assessed - Upper Body Dressing: Unsupported sitting Lower Body Dressing: Performed;Moderate assistance Where Assessed - Lower Body Dressing: Supported sit to stand Toilet Transfer: Chief of Staff: Patient Percentage: 70% Statistician Method: Sit to Barista:  (bed to chair) Equipment Used: Gait belt Transfers/Ambulation Related to ADLs: Pt ambulated from bed to sink with +2 total assist (~15 ft). Pt leans to Right side and tends to veer right when ambulating.   ADL Comments: Pt donned socks sitting EOB with assist for balance (pt leans right) and assist to adust undergarment up over hips once in standing (was already wearing undergarment on arrival just need to pull further up over hips). Performed standing balance activities at sink  with manual and verbal cues to maintain balance and upright posture.      OT Diagnosis:    OT Problem List: Decreased strength;Decreased activity tolerance;Impaired balance (sitting and/or standing);Decreased safety awareness;Decreased knowledge of use of DME or AE;Decreased knowledge of precautions;Impaired UE functional use;Decreased coordination;Decreased cognition OT Treatment Interventions: Self-care/ADL training;Therapeutic exercise;Neuromuscular education;DME and/or AE instruction;Therapeutic activities;Cognitive remediation/compensation;Patient/family education;Balance training   OT Goals Acute Rehab OT Goals OT Goal Formulation: With patient/family Time For Goal Achievement: 10/30/12 Potential to Achieve Goals: Good ADL Goals Pt Will Perform Grooming: with supervision;Standing at sink ADL Goal: Grooming - Progress: Goal set today Pt Will Transfer to Toilet: with min assist;Stand pivot transfer;3-in-1 ADL Goal: Toilet Transfer - Progress: Goal set today Pt Will Perform Toileting - Hygiene: with min assist;Sit to stand from 3-in-1/toilet ADL Goal: Toileting - Hygiene - Progress: Goal set today Miscellaneous OT Goals Miscellaneous OT Goal #1: Pt will perform dynamic standing balance task with min assist  >10 min as precursor for ADLs. OT Goal: Miscellaneous Goal #1 - Progress: Goal set today  Visit Information  Last OT Received On: 10/16/12 Assistance Needed: +2 PT/OT Co-Evaluation/Treatment: Yes    Subjective Data      Prior Functioning     Home Living Lives With: Spouse Available Help at Discharge: Family Type of Home: House Home Access: Stairs to enter [employer] of Steps: 4 Entrance Stairs-Rails: None Home Layout: Two level;Able to live on main level with bedroom/bathroom Alternate Level Stairs-Number of Steps: 20; 15 to landing + 5 more Alternate Level Stairs-Rails: Left Bathroom Shower/Tub: Health visitor: Handicapped  height Bathroom Accessibility: Yes How Accessible: Accessible via wheelchair;Accessible via walker Home Adaptive Equipment: None Additional Comments: Home remodeled Prior Function Level of Independence: Independent Able to Take Stairs?:  Yes Driving: Yes Vocation: Part time employment Communication Communication: HOH Dominant Hand: Right         Vision/Perception Vision - History Baseline Vision:  (herpes of left eye for 30 years) Visual History: Cataracts Patient Visual Report: No change from baseline   Cognition  Cognition Overall Cognitive Status: Impaired Area of Impairment: Memory;Safety/judgement Arousal/Alertness: Awake/alert Orientation Level: Appears intact for tasks assessed Behavior During Session: [facility] for tasks performed Memory Deficits: pt has some difficulty with STM Safety/Judgement: Decreased safety judgement for tasks assessed;Impulsive;Decreased awareness of need for assistance    Extremity/Trunk Assessment Right Upper Extremity Assessment RUE ROM/Strength/Tone: Within functional levels RUE Sensation: WFL - Light Touch;WFL - Proprioception RUE Coordination: Deficits (tremor) Left Upper Extremity Assessment LUE ROM/Strength/Tone: Within functional levels LUE Sensation: WFL - Light Touch;WFL - Proprioception LUE Coordination: Deficits (tremor) Right Lower Extremity Assessment RLE ROM/Strength/Tone: Deficits RLE ROM/Strength/Tone Deficits: 4/5 RLE Sensation: WFL - Light Touch RLE Coordination: Deficits RLE Coordination Deficits: decreased coordination right leg for heel to shin test right compared to left.  Decreased functional coordination while stepping around to chair (ataxic type movements, decreased ability to place foot where he wants it.   Left Lower Extremity Assessment LLE ROM/Strength/Tone: WFL for tasks assessed LLE Sensation: WFL - Light Touch LLE Coordination: WFL - gross/fine motor     Mobility Bed Mobility Bed Mobility: Sitting -  Scoot to Edge of Bed;Supine to Sit Supine to Sit: 4: Min assist;With rails;HOB elevated Sitting - Scoot to Edge of Bed: 5: Supervision Details for Bed Mobility Assistance: MinA with trunk only.  Mildly unsteady with initally difficulty finding midline in sitting.   Transfers Transfers: Sit to Stand;Stand to Sit Sit to Stand: 1: +2 Total assist;With upper extremity assist;From bed Sit to Stand: Patient Percentage: 70% Stand to Sit: 1: +2 Total assist;With upper extremity assist;To chair/3-in-1 Stand to Sit: Patient Percentage: 70% Details for Transfer Assistance: cues for UE use, increased BOS, attending to balance deficit.       Exercise     Balance Balance Balance Assessed: Yes Dynamic Sitting Balance Dynamic Sitting - Balance Support: During functional activity;No upper extremity supported Dynamic Sitting - Level of Assistance: 4: Min assist Dynamic Sitting Balance - Compensations: assist for balance (pt leans right) while donning socks EOB. Static Standing Balance Static Standing - Balance Support: Bilateral upper extremity supported;Left upper extremity supported;Right upper extremity supported;No upper extremity supported Static Standing - Level of Assistance: 1: +2 Total assist (80%) Static Standing - Comment/# of Minutes: pt able to stand at sink utilizing counter top for Bil and unilateral UE support progressing to no UE support.  cues needed for attending to balance, R lateral lean and increasing BOS.   Dynamic Standing Balance Dynamic Standing - Balance Support: Bilateral upper extremity supported Dynamic Standing - Level of Assistance: 1: +2 Total assist (80%) Dynamic Standing - Balance Activities: Lateral lean/weight shifting Dynamic Standing - Comments: VC and tactile cues for foot place/BOS.    End of Session OT - End of Session Equipment Utilized During Treatment: Gait belt Activity Tolerance: Patient tolerated treatment well Patient left: in chair;with call  bell/phone within reach;with family/visitor present Nurse Communication: Mobility status  GO    10/16/2012 Cipriano Mile OTR/L Pager [phone] Office [phone]  Cipriano Mile 10/16/2012, 4:00 PM

## 2012-10-16 NOTE — Progress Notes (Signed)
Rehab admission - Received request for re-consult for inpatient rehab.  Noted PT and OT recommending CIR.  I will discuss with rehab team in am to determine if re-admit to inpatient rehab is needed.  Currently rehab beds are full.  Will follow up in am.  Call me for questions.  #[phone]

## 2012-10-17 ENCOUNTER — Inpatient Hospital Stay (HOSPITAL_COMMUNITY)
Admission: RE | Admit: 2012-10-17 | Discharge: 2012-10-25 | DRG: 945 | Disposition: A | Discharge status: Other Acute Inpatient Hospital | Payer: Medicare Other | Source: Intra-hospital | Attending: Physical Medicine & Rehabilitation | Admitting: Physical Medicine & Rehabilitation

## 2012-10-17 DIAGNOSIS — R471 Dysarthria and anarthria: Secondary | ICD-10-CM

## 2012-10-17 DIAGNOSIS — R29898 Other symptoms and signs involving the musculoskeletal system: Secondary | ICD-10-CM

## 2012-10-17 DIAGNOSIS — I669 Occlusion and stenosis of unspecified cerebral artery: Secondary | ICD-10-CM

## 2012-10-17 DIAGNOSIS — IMO0002 Reserved for concepts with insufficient information to code with codable children: Secondary | ICD-10-CM

## 2012-10-17 DIAGNOSIS — Z931 Gastrostomy status: Secondary | ICD-10-CM

## 2012-10-17 DIAGNOSIS — Z5189 Encounter for other specified aftercare: Principal | ICD-10-CM

## 2012-10-17 DIAGNOSIS — I6340 Cerebral infarction due to embolism of unspecified cerebral artery: Secondary | ICD-10-CM

## 2012-10-17 DIAGNOSIS — Z87891 Personal history of nicotine dependence: Secondary | ICD-10-CM

## 2012-10-17 DIAGNOSIS — R1312 Dysphagia, oropharyngeal phase: Secondary | ICD-10-CM

## 2012-10-17 DIAGNOSIS — I4891 Unspecified atrial fibrillation: Secondary | ICD-10-CM

## 2012-10-17 DIAGNOSIS — E1142 Type 2 diabetes mellitus with diabetic polyneuropathy: Secondary | ICD-10-CM

## 2012-10-17 DIAGNOSIS — K5900 Constipation, unspecified: Secondary | ICD-10-CM

## 2012-10-17 DIAGNOSIS — E039 Hypothyroidism, unspecified: Secondary | ICD-10-CM

## 2012-10-17 DIAGNOSIS — I10 Essential (primary) hypertension: Secondary | ICD-10-CM

## 2012-10-17 DIAGNOSIS — R29810 Facial weakness: Secondary | ICD-10-CM

## 2012-10-17 DIAGNOSIS — E1149 Type 2 diabetes mellitus with other diabetic neurological complication: Secondary | ICD-10-CM

## 2012-10-17 DIAGNOSIS — R279 Unspecified lack of coordination: Secondary | ICD-10-CM

## 2012-10-17 DIAGNOSIS — J690 Pneumonitis due to inhalation of food and vomit: Secondary | ICD-10-CM

## 2012-10-17 DIAGNOSIS — B3781 Candidal esophagitis: Secondary | ICD-10-CM

## 2012-10-17 LAB — BASIC METABOLIC PANEL
Calcium: 9.5 mg/dL (ref 8.4–10.5)
GFR calc non Af Amer: 44 mL/min — ABNORMAL LOW (ref >90)
Glucose, Bld: 183 mg/dL — ABNORMAL HIGH (ref 70–99)
Sodium: 139 mEq/L (ref 135–145)

## 2012-10-17 LAB — GLUCOSE, CAPILLARY
Glucose-Capillary: 177 mg/dL — ABNORMAL HIGH (ref 70–99)
Glucose-Capillary: 159 mg/dL — ABNORMAL HIGH (ref 70–99)
Glucose-Capillary: 138 mg/dL — ABNORMAL HIGH (ref 70–99)

## 2012-10-17 MED ORDER — FREE WATER
200.0000 mL | Status: DC
  Administered 2012-10-17 – 2012-10-23 (×34): 200 mL
  Filled 2012-10-17 (×2): qty 200

## 2012-10-17 MED ORDER — RIVAROXABAN 15 MG PO TABS: 15.0000 mg | ORAL_TABLET | Freq: Every day | ORAL | Status: AC

## 2012-10-17 MED ORDER — LOVASTATIN 10 MG PO TABS: 10.0000 mg | ORAL_TABLET | Freq: Every day | ORAL | Status: AC

## 2012-10-17 MED ORDER — SERTRALINE HCL 25 MG PO TABS: 25.0000 mg | ORAL_TABLET | Freq: Every day | ORAL | Status: AC

## 2012-10-17 MED ORDER — INSULIN ASPART 100 UNIT/ML ~~LOC~~ SOLN
0.0000 [IU] | SUBCUTANEOUS | Status: DC
  Administered 2012-10-17: 1 [IU] via SUBCUTANEOUS
  Administered 2012-10-18 (×2): 2 [IU] via SUBCUTANEOUS
  Administered 2012-10-18: 1 [IU] via SUBCUTANEOUS
  Administered 2012-10-18 (×3): 2 [IU] via SUBCUTANEOUS
  Administered 2012-10-19: 1 [IU] via SUBCUTANEOUS
  Administered 2012-10-19 (×4): 2 [IU] via SUBCUTANEOUS
  Administered 2012-10-19: 3 [IU] via SUBCUTANEOUS
  Administered 2012-10-19 – 2012-10-20 (×4): 2 [IU] via SUBCUTANEOUS
  Administered 2012-10-20: 3 [IU] via SUBCUTANEOUS
  Administered 2012-10-20: 2 [IU] via SUBCUTANEOUS
  Administered 2012-10-21: 3 [IU] via SUBCUTANEOUS
  Administered 2012-10-21 (×2): 2 [IU] via SUBCUTANEOUS
  Administered 2012-10-21: 3 [IU] via SUBCUTANEOUS
  Administered 2012-10-21: 2 [IU] via SUBCUTANEOUS
  Administered 2012-10-21: 3 [IU] via SUBCUTANEOUS
  Administered 2012-10-22 (×7): 2 [IU] via SUBCUTANEOUS
  Administered 2012-10-23: 05:00:00 via SUBCUTANEOUS
  Administered 2012-10-23: 2 [IU] via SUBCUTANEOUS
  Administered 2012-10-23: 1 [IU] via SUBCUTANEOUS
  Administered 2012-10-23 – 2012-10-24 (×4): 2 [IU] via SUBCUTANEOUS
  Administered 2012-10-24: 1 [IU] via SUBCUTANEOUS
  Administered 2012-10-24 (×2): 2 [IU] via SUBCUTANEOUS
  Administered 2012-10-25: 3 [IU] via SUBCUTANEOUS

## 2012-10-17 MED ORDER — FREE WATER: 200.0000 mL | Status: AC

## 2012-10-17 MED ORDER — GLUCERNA 1.2 CAL PO LIQD: 1000.0000 mL | ORAL | Status: AC

## 2012-10-17 MED ORDER — MONTELUKAST SODIUM 10 MG PO TABS: 10.0000 mg | ORAL_TABLET | Freq: Every day | ORAL | Status: AC

## 2012-10-17 MED ORDER — LEVALBUTEROL HCL 0.63 MG/3ML IN NEBU
0.6300 mg | INHALATION_SOLUTION | RESPIRATORY_TRACT | Status: DC | PRN
  Administered 2012-10-18 – 2012-10-25 (×3): 0.63 mg via RESPIRATORY_TRACT
  Filled 2012-10-17 (×3): qty 3

## 2012-10-17 MED ORDER — TRIFLURIDINE 1 % OP SOLN
1.0000 [drp] | Freq: Every day | OPHTHALMIC | Status: DC | PRN
  Administered 2012-10-23: 1 [drp] via OPHTHALMIC
  Filled 2012-10-17: qty 7.5

## 2012-10-17 MED ORDER — LEVOTHYROXINE SODIUM 88 MCG PO TABS
88.0000 ug | ORAL_TABLET | Freq: Every day | ORAL | Status: DC
  Administered 2012-10-18 – 2012-10-25 (×8): 88 ug
  Filled 2012-10-17 (×9): qty 1

## 2012-10-17 MED ORDER — LEVOTHYROXINE SODIUM 88 MCG PO TABS: 88.0000 ug | ORAL_TABLET | Freq: Every day | ORAL | Status: AC

## 2012-10-17 MED ORDER — PREDNISOLONE ACETATE 1 % OP SUSP
1.0000 [drp] | Freq: Every day | OPHTHALMIC | Status: DC | PRN
  Administered 2012-10-23: 1 [drp] via OPHTHALMIC
  Filled 2012-10-17: qty 1

## 2012-10-17 MED ORDER — LEVOTHYROXINE SODIUM 88 MCG PO TABS
88.0000 ug | ORAL_TABLET | Freq: Every day | ORAL | Status: DC
  Administered 2012-10-17: 88 ug
  Filled 2012-10-17 (×2): qty 1

## 2012-10-17 MED ORDER — ACETAMINOPHEN 325 MG PO TABS
325.0000 mg | ORAL_TABLET | ORAL | Status: DC | PRN
  Administered 2012-10-22 – 2012-10-23 (×3): 650 mg
  Filled 2012-10-17 (×3): qty 2

## 2012-10-17 MED ORDER — GLUCERNA 1.2 CAL PO LIQD
1000.0000 mL | ORAL | Status: DC
  Administered 2012-10-17 – 2012-10-21 (×2): 1000 mL
  Filled 2012-10-17 (×10): qty 1000

## 2012-10-17 MED ORDER — ONDANSETRON HCL 4 MG PO TABS: 4.0000 mg | ORAL_TABLET | Freq: Four times a day (QID) | ORAL | Status: DC | PRN

## 2012-10-17 MED ORDER — LISINOPRIL 5 MG PO TABS: 5.0000 mg | ORAL_TABLET | Freq: Every day | ORAL | Status: AC

## 2012-10-17 MED ORDER — ONDANSETRON HCL 4 MG/2ML IJ SOLN: 4.0000 mg | Freq: Four times a day (QID) | INTRAMUSCULAR | Status: DC | PRN

## 2012-10-17 MED ORDER — LORAZEPAM 0.5 MG PO TABS
0.5000 mg | ORAL_TABLET | Freq: Four times a day (QID) | ORAL | Status: DC | PRN
  Administered 2012-10-19 – 2012-10-21 (×3): 0.5 mg
  Filled 2012-10-17 (×3): qty 1

## 2012-10-17 MED ORDER — LORAZEPAM 2 MG/ML IJ SOLN: 0.5000 mg | Freq: Four times a day (QID) | INTRAMUSCULAR | Status: DC | PRN

## 2012-10-17 MED ORDER — RIVAROXABAN 15 MG PO TABS
15.0000 mg | ORAL_TABLET | Freq: Every day | ORAL | Status: DC
  Administered 2012-10-18 – 2012-10-24 (×7): 15 mg via ORAL
  Filled 2012-10-17 (×9): qty 1

## 2012-10-17 MED ORDER — LEVOTHYROXINE SODIUM 88 MCG PO TABS
88.0000 ug | ORAL_TABLET | Freq: Every day | ORAL | Status: DC
  Filled 2012-10-17 (×2): qty 1

## 2012-10-17 MED ORDER — PIOGLITAZONE HCL 30 MG PO TABS: 30.0000 mg | ORAL_TABLET | Freq: Every day | ORAL | Status: AC

## 2012-10-17 NOTE — Interval H&P Note (Signed)
Joe Snow was admitted today to Inpatient Rehabilitation with the diagnosis of right cerebellar infarct.  The patient's history has been reviewed, patient examined, and there is no change in status.  Patient continues to be appropriate for intensive inpatient rehabilitation.  I have reviewed the patient's chart and labs.  Questions were answered to the patient's satisfaction.  Takyla Kuchera T 10/17/2012, 9:05 PM

## 2012-10-17 NOTE — Interval H&P Note (Deleted)
Kinser Seminara was admitted today to Inpatient Rehabilitation with the diagnosis of carotid dissection/ left CVA.  The patient's history has been reviewed, patient examined, and there is no change in status.  Patient continues to be appropriate for intensive inpatient rehabilitation.  I have reviewed the patient's chart and labs.  Questions were answered to the patient's satisfaction.  SWARTZ,ZACHARY T 10/17/2012, 8:59 PM

## 2012-10-17 NOTE — Progress Notes (Signed)
Rehab admissions - I will plan to admit to acute inpatient rehab today.  I have a bed available on inpatient rehab today.  Call me for questions.  #[phone]

## 2012-10-17 NOTE — PMR Pre-admission (Signed)
PMR Admission Coordinator Pre-Admission Assessment  Patient: Joe Snow is an 79 y.o., male MRN: [id number] DOB: Sep 17, 1923 Height: 5' 11.5" (181.6 cm) Weight: 84.324 kg (185 lb 14.4 oz)              Insurance Information HMO:      PPO:       PCP:       IPA:       80/20:       OTHER:   PRIMARY: Medicare A/B      Policy#: [id number]      Subscriber: Joe Snow CM Name:        Phone#:       Fax#:   Pre-Cert#:        Employer: Retired Benefits:  Phone #:       [name] Date: 07/01/91=A 04/30/02=B     Deduct: $1216      Out of Pocket Max: None      Life Max: umlimited CIR: 100%      SNF: 100 days Outpatient: 80%     Co-Pay: 20% Home Health: 100%      Co-Pay:  none DME: 80%     Co-Pay: 20% Providers: patient's choice  SECONDARY: A[employer]      Policy#: [id number]      Subscriber: Joe Snow CM Name:        Phone#:       Fax#:   Pre-Cert#:        Employer: Retired Benefits:  Phone #: [phone]     Name:    Eff. Date:       Deduct:        Out of Pocket Max:        Life Max:   CIR:        SNF:   Outpatient:       Co-Pay:   Home Health:        Co-Pay:   DME:       Co-Pay:    Emergency Contact Information Contact Information   Name Relation Home Work Mobile   Joe Snow Spouse [phone]  [phone]   Joe Snow Son [phone]  [phone]     Current Medical History  Patient Admitting Diagnosis: R cerebellum and R posterior lateral infarcts  History of Present Illness: See previous Pre-admission assessment completed 10/09/12.  Patient admitted to inpatient rehab on 03/12. On 10/11/12 was transferred off rehab to [facility] neuro ICU.   Had acute resp distress, difficulty managing secretions, possible aspiration, increased work of breathing, and was agitated.  PEG was placed on 3/18.  Janina Mayo was cancelled since secretion management had improved.  An abdominal binder was placed over PEG site.  Patient was suctioned as needed.  On 3/20, sitting up in  chair and participating with therapies.  Is alert and interacting with family.  PT and OT re evaluations done and both recommending inpatient rehab.  Currently requiring total assist +2 80% ambulated 10 ft.  Dr. Riley Kill reviewed all progress and deemed patient appropriate for inpatient rehab admission today, 03/20.   Total: 0=NIH  Past Medical History  Past Medical History  Diagnosis Date  . Hypertension associated with diabetes   . Diabetes   . Atrial fibrillation   . Embolic stroke involving left vertebral artery 10/02/2012  . Dysphagia, pharyngeal-neurogenic 10/05/2012  . Dysrhythmia     Conjenatle A-Fib    Family History  family history is not on file.  Prior Rehab/Hospitalizations: Was on inpatient rehab 03/12 to 10/11/12.  No  other rehab admissions.   Current Medications  Current facility-administered medications:0.9 %  sodium chloride infusion, 250 mL, Intravenous, PRN, Simonne Martinet, NP;  dextrose 5 % and 0.45 % NaCl with KCl 40 mEq/L infusion, , Intravenous, Continuous, Merwyn Katos, MD, Last Rate: 50 mL/hr at 10/15/12 2257;  feeding supplement (GLUCERNA 1.2 CAL) liquid 1,000 mL, 1,000 mL, Per Tube, Continuous, Ailene Ards, RD, Last Rate: 50 mL/hr at 10/17/12 0130, 1,000 mL at 10/17/12 0130 free water 200 mL, 200 mL, Per Tube, Q4H, Kathlen Mody, MD, 200 mL at 10/17/12 0420;  hydrALAZINE (APRESOLINE) injection 10 mg, 10 mg, Intravenous, Q4H PRN, Kathlen Mody, MD, 10 mg at 10/16/12 2059;  insulin aspart (novoLOG) injection 0-9 Units, 0-9 Units, Subcutaneous, Q4H, Simonne Martinet, NP, 2 Units at 10/17/12 [phone];  levalbuterol (XOPENEX) nebulizer solution 0.63 mg, 0.63 mg, Nebulization, Q4H PRN, Merwyn Katos, MD levothyroxine (SYNTHROID, LEVOTHROID) tablet 88 mcg, 88 mcg, Per Tube, QAC breakfast, Kathlen Mody, MD;  LORazepam (ATIVAN) injection 0.5 mg, 0.5 mg, Intravenous, Q6H PRN, Kathlen Mody, MD, 0.5 mg at 10/17/12 0056;  prednisoLONE acetate (PRED FORTE) 1 % ophthalmic  suspension 1 drop, 1 drop, Left Eye, Daily PRN, Simonne Martinet, NP;  Rivaroxaban (XARELTO) tablet TABS 15 mg, 15 mg, Oral, Q supper, Sarah J Gribbin, PA-C trifluridine (VIROPTIC) 1 % ophthalmic solution 1 drop, 1 drop, Left Eye, Daily PRN, Simonne Martinet, NP  Patients Current Diet: NPO  Precautions / Restrictions Precautions Precautions: Fall Precaution Comments: Abdominal Binder Restrictions Weight Bearing Restrictions: No   Prior Activity Level Limited Community (1-2x/wk): Spending more time at home most recently.  Was sitting a lot with low energy.  Was still working in Occupational psychologist.  Home Assistive Devices / Equipment Home Assistive Devices/Equipment: None Home Adaptive Equipment: None  Prior Functional Level Prior Function Level of Independence: Independent Able to Take Stairs?: Yes Driving: Yes Vocation: Part time employment  Current Functional Level Cognition  Arousal/Alertness: Awake/alert Overall Cognitive Status: Impaired Memory Deficits: pt has some difficulty with STM Orientation Level: Oriented X4 Safety/Judgement: Decreased safety judgement for tasks assessed;Impulsive;Decreased awareness of need for assistance    Extremity Assessment (includes Sensation/Coordination)  RUE ROM/Strength/Tone: Within functional levels RUE Sensation: WFL - Light Touch;WFL - Proprioception RUE Coordination: Deficits (tremor)  RLE ROM/Strength/Tone: Deficits RLE ROM/Strength/Tone Deficits: 4/5 RLE Sensation: WFL - Light Touch RLE Coordination: Deficits RLE Coordination Deficits: decreased coordination right leg for heel to shin test right compared to left.  Decreased functional coordination while stepping around to chair (ataxic type movements, decreased ability to place foot where he wants it.      ADLs  Upper Body Bathing: Simulated;Minimal assistance Where Assessed - Upper Body Bathing: Unsupported sitting Lower Body Bathing: Simulated;Moderate  assistance Where Assessed - Lower Body Bathing: Supported sit to stand Upper Body Dressing: Simulated;Minimal assistance Where Assessed - Upper Body Dressing: Unsupported sitting Lower Body Dressing: Performed;Moderate assistance Where Assessed - Lower Body Dressing: Supported sit to stand Toilet Transfer: Chief of Staff: Patient Percentage: 70% Statistician Method: Sit to Barista:  (bed to chair) Equipment Used: Gait belt Transfers/Ambulation Related to ADLs: Pt ambulated from bed to sink with +2 total assist (~15 ft). Pt leans to Right side and tends to veer right when ambulating.   ADL Comments: Pt donned socks sitting EOB with assist for balance (pt leans right) and assist to adust undergarment up over hips once in standing (was already wearing undergarment on arrival just need to  pull further up over hips). Performed standing balance activities at sink with manual and verbal cues to maintain balance and upright posture.      Mobility  Bed Mobility: Sitting - Scoot to Edge of Bed;Supine to Sit Supine to Sit: 4: Min assist;With rails;HOB elevated Sitting - Scoot to [place] of Bed: 5: Supervision    Transfers  Transfers: Sit to Stand;Stand to Sit Sit to Stand: 1: +2 Total assist;With upper extremity assist;From bed Sit to Stand: Patient Percentage: 70% Stand to Sit: 1: +2 Total assist;With upper extremity assist;To chair/3-in-1 Stand to Sit: Patient Percentage: 70%    Ambulation / Gait / Stairs / Psychologist, prison and probation services  Ambulation/Gait Ambulation/Gait Assistance: 1: +2 Total assist Ambulation/Gait: Patient Percentage: 80% Ambulation Distance (Feet): 10 Feet Assistive device: 2 person hand held assist Ambulation/Gait Assistance Details: 2 people utilized for safety and chair follow.  pt tends to lean to R side and has difficulty wt shifting to L side.  pt with narrow BOS and tends to keep side stepping R even with cueing for LE  positioning.   Gait Pattern: Step-to pattern;Decreased step length - right;Decreased weight shift to left;Ataxic;Lateral trunk lean to right;Narrow base of support Stairs: No Wheelchair Mobility Wheelchair Mobility: No    Posture / Balance Dynamic Sitting Balance Dynamic Sitting - Balance Support: During functional activity;No upper extremity supported Dynamic Sitting - Level of Assistance: 4: Min assist Dynamic Sitting Balance - Compensations: assist for balance (pt leans right) while donning socks EOB. Static Standing Balance Static Standing - Balance Support: Bilateral upper extremity supported;Left upper extremity supported;Right upper extremity supported;No upper extremity supported Static Standing - Level of Assistance: 1: +2 Total assist (80%) Static Standing - Comment/# of Minutes: pt able to stand at sink utilizing counter top for Bil and unilateral UE support progressing to no UE support.  cues needed for attending to balance, R lateral lean and increasing BOS.   Dynamic Standing Balance Dynamic Standing - Balance Support: Bilateral upper extremity supported Dynamic Standing - Level of Assistance: 1: +2 Total assist (80%) Dynamic Standing - Balance Activities: Lateral lean/weight shifting Dynamic Standing - Comments: VC and tactile cues for foot place/BOS.     Special needs/care consideration BiPAP/CPAP Not on CPAP at home.  Was on CPAP a short time 10/09/12 CPM No Continuous Drip IV 0.45 NS at 50 ml/hr Dialysis No         Life Vest No Oxygen On room AIR now Special Bed No Trach Size No Wound Vac (area) No     Skin Has PEG site                               Bowel mgmt:  Had small BM 10/17/12 Bladder mgmt: Voiding in urinal Diabetic mgmt Yes, insulin dependent and very compliant at home.      Previous Home Environment Living Arrangements: Spouse/significant other Lives With: Spouse Available Help at Discharge: Family Type of Home: House Home Layout: Two level;Able to  live on main level with bedroom/bathroom Alternate Level Stairs-Rails: Left Alternate Level Stairs-Number of Steps: 20; 15 to landing + 5 more Home Access: Stairs to enter Entrance Stairs-Rails: None Entrance Stairs-Number of Steps: 4 Bathroom Shower/Tub: Health visitor: Handicapped height Bathroom Accessibility: Yes How Accessible: Accessible via wheelchair;Accessible via walker Home Care Services: No Additional Comments: Home remodeled  Discharge Living Setting Plans for Discharge Living Setting: Patient's home;House;Lives with (comment) (Lives with wife.) Type of Home at  Discharge: House Discharge Home Layout: Two level;Able to live on main level with bedroom/bathroom Alternate Level Stairs-Number of Steps: 20 Discharge Home Access: Stairs to enter Entrance Stairs-Number of Steps: 3 (R porch has 1/2 step entry.  May build it up or ramp it.) Do you have any problems obtaining your medications?: No  Social/Family/Support Systems Patient Roles: Spouse;Parent Contact Information: Jeanie Bowns - wife Anticipated Caregiver: Wife and children to assist Anticipated Caregiver's Contact Information: Jeronimo Norma (h) [phone] (c) [phone] Ability/Limitations of Caregiver: Wife is in good health and is willing to do what she can for patient.  Has been married for 47 yrs. Caregiver Availability: 24/7 Discharge Plan Discussed with Primary Caregiver: Yes Is Caregiver In Agreement with Plan?: Yes Does Caregiver/Family have Issues with Lodging/Transportation while Pt is in Rehab?: No    Goals/Additional Needs Patient/Family Goal for Rehab: PT/OT/ST supervision goals Expected length of stay: 10 to 14 days Cultural Considerations: None Dietary Needs: NPO with PEG in place for feedings. Equipment Needs: TBD Special Service Needs: May want to plan for a sleep apnea study after discharge from rehab.  Difficulty waking up with shortness of breath and anxiety. Pt/Family  Agrees to Admission and willing to participate: Yes Program Orientation Provided & Reviewed with Pt/Caregiver Including Roles  & Responsibilities: Yes   Decrease burden of Care through IP rehab admission: Diet advancement, Decrease number of caregivers and Patient/family education   Possible need for SNF placement upon discharge: Yes.  If patient does not make suspected functional gains, may require SNF upon discharge.   Patient Condition: This patient's medical and functional status have changed since the original consult dated 10/02/12.  Patient was admitted to inpatient rehab on 03/12 and discharged back to acute on 03/14.  Please see above updated history.  Currently requiring total assist +2 patient contributing 80% to ambulate 10 ft.  Dr Riley Kill saw patient and reviewed medical and functional status.  Patient remains appropriate for acute inpatient rehab admission.  Will admit to inpatient rehab today, 10/17/12  Preadmission Screen Completed By:  Trish Mage, 10/17/2012 11:05 AM ______________________________________________________________________   Discussed status with Dr. Riley Kill on 10/17/12 at 1000 am and received telephone approval for admission today.  Admission Coordinator:  Trish Mage, time1135/Date03/20/14

## 2012-10-17 NOTE — Progress Notes (Signed)
Physical Therapy Treatment Patient Details Name: Joe Snow MRN: [id number] DOB: [birth year] Today's Date: 10/17/2012 Time: 7846-9629 PT Time Calculation (min): 24 min  PT Assessment / Plan / Recommendation Comments on Treatment Session  Pt is motivated, ready to head back up to CIR.  Will need to work on [name].    Follow Up Recommendations  CIR     Does the patient have the potential to tolerate intense rehabilitation     Barriers to Discharge        Equipment Recommendations  Rolling walker with 5" wheels    Recommendations for Other Services Rehab consult  Frequency Min 4X/week   Plan Frequency remains appropriate;Discharge plan needs to be updated    Precautions / Restrictions Precautions Precautions: Fall Precaution Comments: Abdominal Binder Restrictions Weight Bearing Restrictions: No   Pertinent Vitals/Pain     Mobility  Bed Mobility Bed Mobility: Supine to Sit;Sitting - Scoot to Edge of Bed Supine to Sit: 4: Min guard;With rails Sitting - Scoot to Edge of Bed: 5: Supervision Details for Bed Mobility Assistance: vc's for direction and executing task Transfers Transfers: Sit to Stand;Stand to Sit Sit to Stand: 1: +2 Total assist;With upper extremity assist Sit to Stand: Patient Percentage: 70% Stand to Sit: 1: +2 Total assist;With upper extremity assist;To chair/3-in-1 Stand to Sit: Patient Percentage: 70% Details for Transfer Assistance: vc/visual cue for coming forward over his BOS; steadying assist Ambulation/Gait Ambulation/Gait Assistance: 1: +2 Total assist Ambulation/Gait: Patient Percentage: 70% (to 60% with fatigue) Ambulation Distance (Feet): 25 Feet Assistive device: Rolling walker Ambulation/Gait Assistance Details: Initially, pt could easily be assisted to w/shift L, but with fatigue and further cuing , pt began listing left and resisting w/sift R Gait Pattern: Step-to pattern;Decreased step length -  right;Decreased weight shift to left;Ataxic;Lateral trunk lean to right;Narrow base of support General Gait Details: narrowed BOS Modified Rankin (Stroke Patients Only) Modified Rankin: Moderately severe disability    Exercises     PT Diagnosis:    PT Problem List:   PT Treatment Interventions:     PT Goals Acute Rehab PT Goals Time For Goal Achievement: 10/30/12 Pt will go Supine/Side to Sit: with modified independence;with HOB 0 degrees PT Goal: Supine/Side to Sit - Progress: Progressing toward goal Pt will go Sit to Supine/Side: with modified independence;with HOB 0 degrees PT Goal: Sit to Supine/Side - Progress: Progressing toward goal Pt will go Sit to Stand: with supervision PT Goal: Sit to Stand - Progress: Progressing toward goal Pt will go Stand to Sit: with supervision;with upper extremity assist PT Goal: Stand to Sit - Progress: Progressing toward goal Pt will Transfer Bed to Chair/Chair to Bed: with supervision PT Transfer Goal: Bed to Chair/Chair to Bed - Progress: Progressing toward goal Pt will Ambulate: 51 - 150 feet;with min assist;with rolling walker PT Goal: Ambulate - Progress: Progressing toward goal  Visit Information  Last PT Received On: 10/17/12 Assistance Needed: +2    Subjective Data  Subjective: Yeah, I got time   Cognition  Cognition Overall Cognitive Status: Impaired Area of Impairment: Memory;Safety/judgement Arousal/Alertness: Awake/alert Orientation Level: Appears intact for tasks assessed Behavior During Session: [name] for tasks performed    Balance  Static Sitting Balance Static Sitting - Balance Support: Right upper extremity supported;Left upper extremity supported;Feet supported Static Sitting - Level of Assistance: 5: Stand by assistance Dynamic Standing Balance Dynamic Standing - Balance Support: During functional activity;Bilateral upper extremity supported Dynamic Standing - Level of Assistance: 1: +2 Total assist Dynamic  Standing -  Balance Activities: Lateral lean/weight shifting Dynamic Standing - Comments: work to attain vertical with subtle w/shift  End of Session PT - End of Session Equipment Utilized During Treatment: Gait belt Activity Tolerance: Patient tolerated treatment well;Patient limited by fatigue Patient left: in chair;with call bell/phone within reach;with family/visitor present Nurse Communication: Mobility status   GP     Joe Snow, Joe Snow 10/17/2012, 5:36 PM 10/17/2012   Joe Snow, PT [phone] (pager)

## 2012-10-17 NOTE — Discharge Summary (Signed)
Physician Discharge Summary  Joe Snow ZOX:[id number] DOB: 12-09-1923 DOA: 10/11/2012  PCP: Sherrie Mustache, MD  Admit date: 10/11/2012 Discharge date: 10/17/2012  Time spent: 32 minutes  Recommendations for Outpatient Follow-up:  1. Follow up with neurology / gi / pcp as recommended. 2. Please obtain a BMP in one week to evaluate the renal function.  3. Outpatient sleep study for evaluation of sleep apnea 4.   Discharge Diagnoses:  Dysphagia from bulbar stroke Bulbar stroke hypothyroidism Hypertension Hyperlipidemia Hypokalemia Chronic atrial fibrillation   Discharge Condition: fair  Diet recommendation: tube feeds.   Filed Weights   10/11/12 1200 10/14/12 0600 10/17/12 0403  Weight: 92.6 kg (204 lb 2.3 oz) 82.1 kg (181 lb) 84.324 kg (185 lb 14.4 oz)    History of present illness:  79 year old with multiple previous stroke / [facility] for [facility] and in rehab where he he is congitively intact and even transferring with assist and can speak with difficulty but retaining secretions and unable to swallow. Not eaten in 10 days. Unable to lie flat due to secretions. PEr neurology: Mostly bulbar stroke with good prognosis to regain swallowing. PCCM asked to evaluate. After detailed family meeting patient and family decided to go with percutaenous dilataional trach and IR guided PEG. So PCCM admitting from rehab on 10/11/12 to 3100 ICU for trach on 10/14/12. Last dose xarelto 10/10/12. Marland Kitchen Secretion mgmt improved and trach cancelled . PEG placed 3/18 AND restarted feeds. Patient was transferred to [facility] on 3/19.    Hospital Course:  1. Bulbar stroke with dysphagia ; s/p PEG placement and started feeds on 3/19. If he is able to tolerate feeds able to go to CIR in am.  2. Airway hygiene much improved  Cont current mgmt. No trach for now but remains an option in future if deemed necessary. He had a CXR done shows no new consolidation.  3. Chronic AF  Xarelto  held 10/10/12 for PEG. Resumed 3/18  Rate controlled.  4. Mild hypokalemia  Replete as needed.  5. Dysphagia.  Esophageal candidiasis, resolved after 7 days fluconazole.  6. Diabetes Mellitus: resume home medications.     Procedures: 3/8 EGD Leone Payor): INDICATIONS: Dysphagia. ? food impaction. FINDINGS: White exudates consistent with candidiasis in the esophagus  3/18 PEG placement (Pyrtle). Findings: Nl esophageal mucosa. Successful placement of PEG tube      Consultations:  PCCM SIGNED OFF  GI  Discharge Exam: Filed Vitals:   10/16/12 1733 10/16/12 2100 10/16/12 2149 10/17/12 0403  BP:  209/106 189/76 169/88  Pulse:  88  98  Temp:  98.2 F (36.8 C)  97.8 F (36.6 C)  TempSrc:  Oral  Oral  Resp:  20  20  Height:      Weight:    84.324 kg (185 lb 14.4 oz)  SpO2: 97% 94%  92%   General: NAD  Neck: No JVD  Cardiovascular: RRR  Lungs: CTAB  Abdomen: Soft, NT, ND  Ext: No edema, warm.   Discharge Instructions  Discharge Orders   Future Orders Complete By Expires     Activity as tolerated - No restrictions  As directed     Diet - low sodium heart healthy  As directed     Discharge instructions  As directed     Comments:      FOLLOW UP  With PCP and neurology as recommended.        Medication List    TAKE these medications  feeding supplement Liqd  Take 237 mLs by mouth 2 (two) times daily between meals.     feeding supplement (GLUCERNA 1.2 CAL) Liqd  Place 1,000 mLs into feeding tube continuous.     free water Soln  Place 200 mLs into feeding tube every 4 (four) hours.     insulin detemir 100 UNIT/ML injection  Commonly known as:  LEVEMIR  Inject 20-25 Units into the skin 2 (two) times daily.     levothyroxine 88 MCG tablet  Commonly known as:  SYNTHROID, LEVOTHROID  Place 1 tablet (88 mcg total) into feeding tube daily before breakfast.     lisinopril 5 MG tablet  Commonly known as:  PRINIVIL,ZESTRIL  Place 1 tablet (5 mg total) into  feeding tube daily.     lovastatin 10 MG tablet  Commonly known as:  MEVACOR  Place 1 tablet (10 mg total) into feeding tube daily.     montelukast 10 MG tablet  Commonly known as:  SINGULAIR  Place 1 tablet (10 mg total) into feeding tube daily.     pioglitazone 30 MG tablet  Commonly known as:  ACTOS  Place 1 tablet (30 mg total) into feeding tube daily.     PRED FORTE 1 % ophthalmic suspension  Generic drug:  prednisoLONE acetate  Place 1 drop into the left eye daily as needed (flares).     Rivaroxaban 15 MG Tabs tablet  Commonly known as:  XARELTO  Take 1 tablet (15 mg total) by mouth daily with supper.     sertraline 25 MG tablet  Commonly known as:  ZOLOFT  Place 1 tablet (25 mg total) into feeding tube daily.     trifluridine 1 % ophthalmic solution  Commonly known as:  VIROPTIC  Place 1 drop into the left eye daily as needed (flares).          The results of significant diagnostics from this hospitalization (including imaging, microbiology, ancillary and laboratory) are listed below for reference.    Significant Diagnostic Studies: Dg Chest 2 View  10/06/2012  *RADIOLOGY REPORT*  Clinical Data: Aspiration in a stroke patient.  CHEST - 2 VIEW  Comparison: 1 day prior  Findings: Degraded lateral view secondary patient arm position and artifact posteriorly.  Right hemidiaphragm elevation is mild.  Cardiomegaly with atherosclerosis in the transverse aorta. No pleural effusion or pneumothorax.  Resolved right base air space disease. No congestive failure.  IMPRESSION: Resolved right base air space disease.  Cardiomegaly, without acute disease.   Original Report Authenticated By: Jeronimo Greaves, M.D.    Mr Brain Wo Contrast  10/07/2012  *RADIOLOGY REPORT*  Clinical Data: Stroke last week treated with t-PA.  Developed severe dysphagia over the last couple days.  MRI HEAD WITHOUT CONTRAST  Technique:  Multiplanar, multiecho pulse sequences of the brain and surrounding structures  were obtained according to standard protocol without intravenous contrast.  Comparison: 10/01/2012.  Findings: Motion degraded exam.  In this patient who was recently noted to have a right inferior cerebellar and right posterior lateral medulla infarct, there has been extension of the infarct with new small infarcts anterior right cerebellum and posterior right cerebellum.  No obvious intracranial hemorrhage (gradient sequence significantly motion degraded).  Prominent small vessel disease type changes.  Global atrophy.  No intracranial mass lesion detected on this unenhanced exam.  Transverse ligament hypertrophy.  Cervical spondylotic changes with spinal stenosis and slight cord flattening C2-3 and C3-4.  IMPRESSION: Motion degraded exam.  In this patient who was  recently noted to have a right inferior cerebellar and right posterior lateral medulla infarct, there has been extension of the infarct with new small infarcts anterior right cerebellum and posterior right cerebellum.  No obvious intracranial hemorrhage (gradient sequence significantly motion degraded).  This has been made a PRA call report utilizing dashboard call feature.   Original Report Authenticated By: Lacy Duverney, M.D.    Mr Brain Wo Contrast  10/02/2012  *RADIOLOGY REPORT*  Clinical Data:  Ataxia right-sided weakness post t-PA.  Diabetic hypertensive patient with atrial fibrillation.  MRI BRAIN WITHOUT CONTRAST MRA HEAD WITHOUT CONTRAST  Technique: Multiplanar, multiecho pulse sequences of the brain and surrounding structures were obtained according to standard protocol without intravenous contrast.  Angiographic images of the head were obtained using MRA technique without contrast.  Comparison: None.  MRI HEAD  Findings:  Small acute non hemorrhagic infarcts inferior aspect of the right cerebellum and right posterior lateral aspect of the medulla.  No intracranial hemorrhage.  Prominent small vessel disease type changes.  Global atrophy  without hydrocephalus.  No intracranial mass lesion detected on this unenhanced exam.  Abnormal appearance right vertebral artery.  Please see below.  Mucosal thickening paranasal sinuses minimal to mild in degree most notable inferior aspect of the left maxillary sinus.  Mild transverse ligament hypertrophy.  Mild degenerative changes C2- C3 and  C3-4 with mild spinal stenosis and minimal cord flattening.  IMPRESSION:  Small acute non hemorrhagic infarcts inferior aspect of the right cerebellum and right posterior lateral aspect of the medulla.  Please see above.  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  Mild irregularity of the left PICA.  Mild to slightly moderate narrowing and irregularity of the mid to distal basilar artery.  Nonvisualization AICAs.  Moderate narrowing superior cerebellar artery bilaterally.  Mild to moderate narrowing posterior cerebral artery bilaterally greater on the left.  Mild to moderate narrowing the left internal carotid artery petrous/pre cavernous segment junction.  Moderate narrowing cavernous segment right internal carotid artery with mild to moderate narrowing cavernous segment left internal carotid artery.  Moderate narrowing at the junction of the A1 and A2 segment of the left anterior cerebral artery.  Mild to moderate narrowing middle cerebral artery branches bilaterally.  No aneurysm or vascular malformation detected.  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  Intracranial atherosclerotic type changes otherwise as detailed above.  Preliminary result by Dr. Cherly Hensen called to the nursing staff  of 3100 10/02/2012 5:34 a.m.   Original Report Authenticated By: Lacy Duverney, M.D.    Dg Chest Port 1 View  10/16/2012  *RADIOLOGY REPORT*  Clinical Data: Cough.  Evaluation for aspiration pneumonia.  PORTABLE CHEST - 1 VIEW  Comparison: One-view chest 10/12/2012.  Findings: Heart size is normal.  Aeration at the right lung base has  improved.  Mild chronic interstitial coarsening is otherwise stable.  IMPRESSION:  1.  Improved aeration right lung base without evidence for residual airspace disease. 2. Stable underlying chronic interstitial coarsening.   Original Report Authenticated By: Marin Roberts, M.D.    Dg Chest Port 1 View  10/12/2012  *RADIOLOGY REPORT*  Clinical Data: Follow-up respiratory failure.  Trach scheduled for 10/14/2012.  PORTABLE CHEST - 1 VIEW  Comparison: 10/09/2012  Findings: The heart is enlarged.  This is accentuated by the portable position and patient rotation.  There is mild perihilar bronchitic change.  No focal consolidations or pleural effusions are identified.  There is minimal right base atelectasis.  A  feeding tube has been removed.  IMPRESSION:  1. Cardiomegaly. 2.  Bronchitic changes.   Original Report Authenticated By: Norva Pavlov, M.D.    Dg Chest Port 1 View  10/09/2012  *RADIOLOGY REPORT*  Clinical Data: Shortness of breath  PORTABLE CHEST - 1 VIEW  Comparison: 09/30/2012, 10/05/2012, 10/06/2012  Findings: Heart size mildly enlarged.  Aortic atherosclerosis. Mild central vascular prominence.  Mild right lung base opacity. Trace right effusion not excluded.  Mild elevation of the right hemidiaphragm.  Enteric tube descends below the level of the image. No acute osseous finding.  No  pneumothorax.  IMPRESSION: Mild right lung base opacity appears similar to 10/05/2012; atelectasis (favored), versus mild infiltrate or aspiration pneumonitis.   Original Report Authenticated By: Jearld Lesch, M.D.    Dg Chest Port 1 View  10/05/2012  *RADIOLOGY REPORT*  Clinical Data: Dyspnea.  PORTABLE CHEST - 1 VIEW  Comparison: Chest x-ray 09/30/2012.  Findings: Lung volumes are low.  Minimal blunting of the right costophrenic sulcus may suggest a trace right-sided pleural effusion.  No definite consolidative airspace disease, however, there is some mild interstitial prominence in the right base.  Pulmonary vasculature is within normal limits.  Heart size is upper limits of normal.  Atherosclerosis in the thoracic aorta.  IMPRESSION: 1.  Interstitial prominence in the right base could suggest sequelae of recent aspiration with some pneumonitis in the right lower lobe.  In addition, there is a trace right pleural effusion. 2.  Borderline cardiomegaly. 3.  Atherosclerosis.   Original Report Authenticated By: Trudie Reed, M.D.    Dg Chest Port 1 View  09/30/2012  *RADIOLOGY REPORT*  Clinical Data: Nausea vomiting.  Shortness of breath.  PORTABLE CHEST - 1 VIEW  Comparison: None.  Findings: 1939 hours. The cardiopericardial silhouette is enlarged. Interstitial markings are diffusely coarsened with chronic features.  No overt airspace pulmonary edema or focal lung consolidation.  No substantial pleural effusion although left costophrenic angle has not been included on the film. Imaged bony structures of the thorax are intact. Telemetry leads overlie the chest.  IMPRESSION: Cardiomegaly with underlying chronic interstitial coarsening.   Original Report Authenticated By: Kennith Center, M.D.    Dg Abd Portable 1v  10/10/2012  *RADIOLOGY REPORT*  Clinical Data: Nasogastric tube placement, coughing  PORTABLE ABDOMEN - 1 VIEW  Comparison: Portable exam 2243 hours compared to 10/09/2038  Findings: Nasogastric tube tip projects over gastric antrum. Retained contrast in colon. Calcified gallstones right upper quadrant. Single surgical clip in pelvis. Nonobstructive bowel gas pattern. Diffuse osseous demineralization. No urinary tract calcification.  IMPRESSION: Nasogastric tube tip projects over gastric antrum. Cholelithiasis.   Original Report Authenticated By: Ulyses Southward, M.D.    Dg Abd Portable 1v  10/08/2012  *RADIOLOGY REPORT*  Clinical Data: Re-check Panda placement  PORTABLE ABDOMEN - 1 VIEW  Comparison: 10/07/2012  Findings: Weighted feeding tube is looped in the proximal gastric body.  Nonobstructive  bowel gas pattern.  Residual contrast in the colon.  IMPRESSION: Weighted feeding tube is looped in the proximal gastric body.   Original Report Authenticated By: Charline Bills, M.D.    Dg Abd Portable 1v  10/07/2012  *RADIOLOGY REPORT*  Clinical Data: Evaluate NG tube place  PORTABLE ABDOMEN - 1 VIEW  Comparison: Mid abdomen film of 10/06/2012  Findings: The tip of the feeding tube is in the region of the distal body of the stomach.  The bowel gas pattern is nonspecific. Gallstones are present within the right upper quadrant.  IMPRESSION:  1.  NG tube tip in distal antrum of stomach. 2.  Gallstones.   Original Report Authenticated By: Dwyane Dee, M.D.    Dg Abd Portable 1v  10/06/2012  *RADIOLOGY REPORT*  Clinical Data: Advanced feeding tube  PORTABLE ABDOMEN - 1 VIEW  Comparison: Portable exam 2231 hours compared to 1735 hours  Findings: Tip of feeding tube now projects over gastric antrum. Retained contrast right colon. Cholelithiasis. Lung bases clear.  IMPRESSION: Tip of feeding tube now projects over gastric antrum.   Original Report Authenticated By: Ulyses Southward, M.D.    Dg Abd Portable 1v  10/06/2012  *RADIOLOGY REPORT*  Clinical Data: Nasogastric tube placement  PORTABLE ABDOMEN - 1 VIEW  Comparison: Portable exam 1735 hours without priors for comparison  Findings: Tip of feeding tube projects over the proximal stomach. Visualized bowel gas pattern normal. Elevation of right diaphragm. Lung bases otherwise clear.  IMPRESSION: Tip of feeding tube projects over the proximal stomach.   Original Report Authenticated By: Ulyses Southward, M.D.    Mr Mra Head/brain Wo Cm  10/02/2012  *RADIOLOGY REPORT*  Clinical Data:  Ataxia right-sided weakness post t-PA.  Diabetic hypertensive patient with atrial fibrillation.  MRI BRAIN WITHOUT CONTRAST MRA HEAD WITHOUT CONTRAST  Technique: Multiplanar, multiecho pulse sequences of the brain and surrounding structures were obtained according to standard protocol without  intravenous contrast.  Angiographic images of the head were obtained using MRA technique without contrast.  Comparison: None.  MRI HEAD  Findings:  Small acute non hemorrhagic infarcts inferior aspect of the right cerebellum and right posterior lateral aspect of the medulla.  No intracranial hemorrhage.  Prominent small vessel disease type changes.  Global atrophy without hydrocephalus.  No intracranial mass lesion detected on this unenhanced exam.  Abnormal appearance right vertebral artery.  Please see below.  Mucosal thickening paranasal sinuses minimal to mild in degree most notable inferior aspect of the left maxillary sinus.  Mild transverse ligament hypertrophy.  Mild degenerative changes C2- C3 and  C3-4 with mild spinal stenosis and minimal cord flattening.  IMPRESSION:  Small acute non hemorrhagic infarcts inferior aspect of the right cerebellum and right posterior lateral aspect of the medulla.  Please see above.  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  Mild irregularity of the left PICA.  Mild to slightly moderate narrowing and irregularity of the mid to distal basilar artery.  Nonvisualization AICAs.  Moderate narrowing superior cerebellar artery bilaterally.  Mild to moderate narrowing posterior cerebral artery bilaterally greater on the left.  Mild to moderate narrowing the left internal carotid artery petrous/pre cavernous segment junction.  Moderate narrowing cavernous segment right internal carotid artery with mild to moderate narrowing cavernous segment left internal carotid artery.  Moderate narrowing at the junction of the A1 and A2 segment of the left anterior cerebral artery.  Mild to moderate narrowing middle cerebral artery branches bilaterally.  No aneurysm or vascular malformation detected.  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  Intracranial atherosclerotic type changes otherwise as detailed above.  Preliminary result by Dr. Cherly Hensen called  to the nursing staff  of 3100 10/02/2012 5:34 a.m.   Original Report Authenticated By: Lacy Duverney, M.D.     Microbiology: No results found for this or any previous visit (from the past 240 hour(s)).   Labs: Basic Metabolic Panel:  Recent Labs Lab 10/11/12 1454 10/12/12 0535 10/14/12 0423 10/15/12 0315 10/17/12 0515  NA 140 140 142 141 139  K 3.7 3.7 3.3* 3.0*  4.8  CL 99 99 102 102 103  CO2 32 33* 33* 28 29  GLUCOSE 192* 171* 183* 174* 183*  BUN 20 18 12 11 14   CREATININE 1.23 1.31 1.38* 1.35 1.41*  CALCIUM 9.5 9.2 9.4 9.3 9.5  MG 2.1  --   --   --  1.7  PHOS 2.4  --   --   --   --    Liver Function Tests:  Recent Labs Lab 10/11/12 1454  AST 54*  ALT 57*  ALKPHOS 96  BILITOT 0.7  PROT 7.2  ALBUMIN 3.6   No results found for this basename: LIPASE, AMYLASE,  in the last 168 hours No results found for this basename: AMMONIA,  in the last 168 hours CBC:  Recent Labs Lab 10/11/12 1454 10/12/12 0535 10/14/12 0423  WBC 11.1* 9.4 7.9  HGB 12.9* 11.7* 12.7*  HCT 41.5 36.5* 40.6  MCV 85.9 84.5 84.1  PLT 199 188 180   Cardiac Enzymes: No results found for this basename: CKTOTAL, CKMB, CKMBINDEX, TROPONINI,  in the last 168 hours BNP: BNP (last 3 results) No results found for this basename: PROBNP,  in the last 8760 hours CBG:  Recent Labs Lab 10/16/12 1604 10/16/12 2003 10/17/12 0023 10/17/12 0416 10/17/12 0728  GLUCAP 168* 167* 147* 161* 177*       Signed:  Porter Nakama  [facility] 10/17/2012, 10:22 AM

## 2012-10-17 NOTE — H&P (View-Only) (Signed)
Physical Medicine and Rehabilitation Admission H&P    No chief complaint on file. : HPI: Joe Snow is a 79 y.o. right-handed male with documented history of hypertension, diabetes mellitus and atrial fibrillation off Coumadin due to diverticulitis with GI bleeding December 2013. Patient admitted 09/30/2012 with right-sided weakness and facial droop. He was initially taken to [facility] with cranial CT scan showing no acute intracranial abnormality. He was transferred to [facility] for further management. Patient did receive TPA at [facility]. Neurology service followup with workup ongoing . MRI of the brain 3/4 showed Small acute non hemorrhagic infarcts inferior aspect of  the right cerebellum and right posterior lateral aspect of the  medulla.  Carotid Dopplers with no ICA stenosis. Cardiology followup for history of atrial fibrillation and and echocardiogram completed showing ejection fraction 65% and normal systolic function. Placed on Xarelto for atrial fibrillation as well as stroke prophylaxis. Patient noted on 10/07/2012 with some increased confusion as well his swallowing difficulties MRI of the brain completed showing very been extension of the prior infarct with new small infarcts anterior right cerebellum and posterior right cerebellum. He was advised to continue present regimen with Xarelto. Patient with intermittent bouts of shortness of breath chest x-ray with no acute changes. ABGs initially consistent with hypoxia and chronic retention. Discussed with pulmonary in regards to ABGs and given that he intermittently is waking up gasping, concerned that he might have had a component of sleep apnea and placed on CPAP. Followup speech therapy and findings of severe oral pharyngeal dysphagia as well as dysarthria with swallow study completed advised patient to be n.p.o. with nasogastric tube placed. There was some concern of possible need for gastrostomy tube  and followup gastroenterology Dr. Gessner . There was finding of esophageal candidiasis and placed on Diflucan x14 days . Patient was admitted inpatient rehabilitation services 10/09/2012 with slow progressive gains. He continued to have issues in regards to handling his secretions as well as drops in oxygen saturations. Pulmonary services was reconsulted and his CPAP was discontinued. It was felt that his respiratory issues were out of result of his severe dysphagia and secretion management. There was some concern that he possibly may need tracheostomy as well as gastrostomy feeding tube until his swallowing difficulties and pulmonary issues resolved. He was discharged to acute care services 10/11/2012 and gastrostomy tube placed 10/15/2012 for nutritional support the patient remained n.p.o. He appear to be doing much better in the way of handling his secretions and it felt best at this time to hold on tracheostomy tube and monitor closely. Therapies are resumed and it was felt patient should be readmitted back to inpatient rehabilitation services for ongoing comprehensive therapy. Occupational therapy evaluation completed 10/01/2012 with recommendations for physical medicine rehabilitation consult to consider inpatient rehabilitation services  Review of Systems  Cardiovascular: Positive for palpitations.  Musculoskeletal: Positive for myalgias.  All other systems reviewed and are negative    Past Medical History  Diagnosis Date  . Hypertension associated with diabetes   . Diabetes   . Atrial fibrillation   . Embolic stroke involving left vertebral artery 10/02/2012  . Dysphagia, pharyngeal-neurogenic 10/05/2012  . Dysrhythmia     Conjenatle A-Fib   Past Surgical History  Procedure Laterality Date  . Prostate surgery N/A 1995    estimate  . Tonsillectomy Bilateral 1930s  . Esophagogastroduodenoscopy N/A 10/05/2012    Procedure: ESOPHAGOGASTRODUODENOSCOPY (EGD);  Surgeon: Carl E Gessner, MD;   Location: [facility] ENDOSCOPY;  Service: Endoscopy;    Laterality: N/A;  . Peg placement N/A 10/15/2012    Procedure: PERCUTANEOUS ENDOSCOPIC GASTROSTOMY (PEG) PLACEMENT;  Surgeon: Jay M Pyrtle, MD;  Location: [facility] ENDOSCOPY;  Service: Gastroenterology;  Laterality: N/A;   History reviewed. No pertinent family history. Social History:  reports that he quit smoking about 34 years ago. His smoking use included Cigarettes. He smoked 0.00 packs per day. He has never used smokeless tobacco. He reports that he drinks about 1.0 ounces of alcohol per week. He reports that he does not use illicit drugs. Allergies:  Allergies  Allergen Reactions  . Metoprolol Shortness Of Breath  . Amlodipine Other (See Comments)    Lethargy    Medications Prior to Admission  Medication Sig Dispense Refill  . feeding supplement (GLUCERNA SHAKE) LIQD Take 237 mLs by mouth 2 (two) times daily between meals.  60 Can  2  . insulin detemir (LEVEMIR) 100 UNIT/ML injection Inject 20-25 Units into the skin 2 (two) times daily.      . levothyroxine (SYNTHROID, LEVOTHROID) 88 MCG tablet Take 88 mcg by mouth daily.      . lisinopril (PRINIVIL,ZESTRIL) 5 MG tablet Take 1 tablet (5 mg total) by mouth daily.  30 tablet  2  . lovastatin (MEVACOR) 10 MG tablet Take 10 mg by mouth daily.      . montelukast (SINGULAIR) 10 MG tablet Take 10 mg by mouth daily.      . pioglitazone (ACTOS) 30 MG tablet Take 30 mg by mouth daily.      . prednisoLONE acetate (PRED FORTE) 1 % ophthalmic suspension Place 1 drop into the left eye daily as needed (flares).      . Rivaroxaban (XARELTO) 15 MG TABS tablet Take 1 tablet (15 mg total) by mouth daily with supper.  30 tablet  2  . sertraline (ZOLOFT) 25 MG tablet Take 25 mg by mouth daily.      . trifluridine (VIROPTIC) 1 % ophthalmic solution Place 1 drop into the left eye daily as needed (flares).        Home: Home Living Lives With: Spouse Available Help at Discharge: Family Type of Home: House Home  Access: Stairs to enter Entrance Stairs-Number of Steps: 4 Entrance Stairs-Rails: None Home Layout: Two level;Able to live on main level with bedroom/bathroom Alternate Level Stairs-Number of Steps: 20; 15 to landing + 5 more Alternate Level Stairs-Rails: Left Bathroom Shower/Tub: Walk-in shower Bathroom Toilet: Handicapped height Bathroom Accessibility: Yes How Accessible: Accessible via wheelchair;Accessible via walker Home Adaptive Equipment: None Additional Comments: Home remodeled   Functional History: Prior Function Able to Take Stairs?: Yes Driving: Yes Vocation: Part time employment  Functional Status:  Mobility: Bed Mobility Bed Mobility: Sitting - Scoot to Edge of Bed;Supine to Sit Supine to Sit: 4: Min assist;With rails;HOB elevated Sitting - Scoot to Edge of Bed: 5: Supervision Transfers Transfers: Sit to Stand;Stand to Sit Sit to Stand: 1: +2 Total assist;With upper extremity assist;From bed Sit to Stand: Patient Percentage: 70% Stand to Sit: 1: +2 Total assist;With upper extremity assist;To chair/3-in-1 Stand to Sit: Patient Percentage: 70% Ambulation/Gait Ambulation/Gait Assistance: 1: +2 Total assist Ambulation/Gait: Patient Percentage: 80% Ambulation Distance (Feet): 10 Feet Assistive device: 2 person hand held assist Ambulation/Gait Assistance Details: 2 people utilized for safety and chair follow.  pt tends to lean to R side and has difficulty wt shifting to L side.  pt with narrow BOS and tends to keep side stepping R even with cueing for LE positioning.   Gait Pattern: Step-to   pattern;Decreased step length - right;Decreased weight shift to left;Ataxic;Lateral trunk lean to right;Narrow base of support Stairs: No Wheelchair Mobility Wheelchair Mobility: No  ADL: ADL Upper Body Bathing: Simulated;Minimal assistance Where Assessed - Upper Body Bathing: Unsupported sitting Lower Body Bathing: Simulated;Moderate assistance Where Assessed - Lower Body  Bathing: Supported sit to stand Upper Body Dressing: Simulated;Minimal assistance Where Assessed - Upper Body Dressing: Unsupported sitting Lower Body Dressing: Performed;Moderate assistance Where Assessed - Lower Body Dressing: Supported sit to stand Toilet Transfer: Simulated;+2 Total assistance Toilet Transfer Method: Sit to stand Toilet Transfer Equipment:  (bed to chair) Equipment Used: Gait belt Transfers/Ambulation Related to ADLs: Pt ambulated from bed to sink with +2 total assist (~15 ft). Pt leans to Right side and tends to veer right when ambulating.   ADL Comments: Pt donned socks sitting EOB with assist for balance (pt leans right) and assist to adust undergarment up over hips once in standing (was already wearing undergarment on arrival just need to pull further up over hips). Performed standing balance activities at sink with manual and verbal cues to maintain balance and upright posture.    Cognition: Cognition Arousal/Alertness: Awake/alert Orientation Level: Oriented X4 Cognition Overall Cognitive Status: Impaired Area of Impairment: Memory;Safety/judgement Arousal/Alertness: Awake/alert Orientation Level: Appears intact for tasks assessed Behavior During Session: WFL for tasks performed Memory Deficits: pt has some difficulty with STM Safety/Judgement: Decreased safety judgement for tasks assessed;Impulsive;Decreased awareness of need for assistance  Physical Exam: Blood pressure 169/88, pulse 98, temperature 97.8 F (36.6 C), temperature source Oral, resp. rate 20, height 5' 11.5" (1.816 m), weight 84.324 kg (185 lb 14.4 oz), SpO2 92.00%. Physical Exam:  Blood pressure 130/74, pulse 85, temperature 97.9 F (36.6 C), temperature source Oral, resp. rate 18, height 5' 11" (1.803 m), weight 89.6 kg (197 lb 8.5 oz), SpO2 97.00%.  Vitals reviewed.  Constitutional: He appears well-developed.  HENT:  Head: Normocephalic.  Eyes: EOM are normal.  Neck: Normal range of  motion. Neck supple. No thyromegaly present.  Cardiovascular:  Cardiac rate controlled  Pulmonary/Chest: Breath sounds normal. No respiratory distress.  Abdominal: Soft. Bowel sounds are normal. He exhibits no distension. Gastrostomy tube in place with minimal drainage. Musculoskeletal: He exhibits no edema.  Neurological: He is alert.  Patient is alert. He was able to name person. Speech is dysarthric. Follows all commands. Right-sided limb ataxia in right arm and leg. Fair sitting balance while in the chair. Has reasonable insight and awareness. No gross sensory deficits. Handling secretions fairly well.    Results for orders placed during the hospital encounter of 10/11/12 (from the past 48 hour(s))  GLUCOSE, CAPILLARY     Status: Abnormal   Collection Time    10/15/12 11:40 AM      Result Value Range   Glucose-Capillary 198 (*) 70 - 99 mg/dL  GLUCOSE, CAPILLARY     Status: Abnormal   Collection Time    10/15/12  4:27 PM      Result Value Range   Glucose-Capillary 168 (*) 70 - 99 mg/dL  GLUCOSE, CAPILLARY     Status: Abnormal   Collection Time    10/15/12  7:29 PM      Result Value Range   Glucose-Capillary 163 (*) 70 - 99 mg/dL  GLUCOSE, CAPILLARY     Status: Abnormal   Collection Time    10/15/12 11:51 PM      Result Value Range   Glucose-Capillary 138 (*) 70 - 99 mg/dL  GLUCOSE, CAPILLARY       Status: Abnormal   Collection Time    10/16/12  3:49 AM      Result Value Range   Glucose-Capillary 131 (*) 70 - 99 mg/dL  GLUCOSE, CAPILLARY     Status: Abnormal   Collection Time    10/16/12  7:33 AM      Result Value Range   Glucose-Capillary 142 (*) 70 - 99 mg/dL   Comment 1 Notify RN    GLUCOSE, CAPILLARY     Status: Abnormal   Collection Time    10/16/12 11:12 AM      Result Value Range   Glucose-Capillary 164 (*) 70 - 99 mg/dL   Comment 1 Notify RN    GLUCOSE, CAPILLARY     Status: Abnormal   Collection Time    10/16/12  4:04 PM      Result Value Range    Glucose-Capillary 168 (*) 70 - 99 mg/dL   Comment 1 Notify RN    GLUCOSE, CAPILLARY     Status: Abnormal   Collection Time    10/16/12  8:03 PM      Result Value Range   Glucose-Capillary 167 (*) 70 - 99 mg/dL   Comment 1 Notify RN    GLUCOSE, CAPILLARY     Status: Abnormal   Collection Time    10/17/12 12:23 AM      Result Value Range   Glucose-Capillary 147 (*) 70 - 99 mg/dL   Comment 1 Notify RN    GLUCOSE, CAPILLARY     Status: Abnormal   Collection Time    10/17/12  4:16 AM      Result Value Range   Glucose-Capillary 161 (*) 70 - 99 mg/dL   Comment 1 Notify RN    MAGNESIUM     Status: None   Collection Time    10/17/12  5:15 AM      Result Value Range   Magnesium 1.7  1.5 - 2.5 mg/dL  BASIC METABOLIC PANEL     Status: Abnormal   Collection Time    10/17/12  5:15 AM      Result Value Range   Sodium 139  135 - 145 mEq/L   Potassium 4.8  3.5 - 5.1 mEq/L   Chloride 103  96 - 112 mEq/L   CO2 29  19 - 32 mEq/L   Glucose, Bld 183 (*) 70 - 99 mg/dL   BUN 14  6 - 23 mg/dL   Creatinine, Ser 1.41 (*) 0.50 - 1.35 mg/dL   Calcium 9.5  8.4 - 10.5 mg/dL   GFR calc non Af Amer 44 (*) >90 mL/min   GFR calc Af Amer 50 (*) >90 mL/min   Comment:            The eGFR has been calculated     using the CKD EPI equation.     This calculation has not been     validated in all clinical     situations.     eGFR's persistently     <90 mL/min signify     possible Chronic Kidney Disease.  GLUCOSE, CAPILLARY     Status: Abnormal   Collection Time    10/17/12  7:28 AM      Result Value Range   Glucose-Capillary 177 (*) 70 - 99 mg/dL   Dg Chest Port 1 View  10/16/2012  *RADIOLOGY REPORT*  Clinical Data: Cough.  Evaluation for aspiration pneumonia.  PORTABLE CHEST - 1 VIEW  Comparison: One-view chest 10/12/2012.    Findings: Heart size is normal.  Aeration at the right lung base has improved.  Mild chronic interstitial coarsening is otherwise stable.  IMPRESSION:  1.  Improved aeration right  lung base without evidence for residual airspace disease. 2. Stable underlying chronic interstitial coarsening.   Original Report Authenticated By: Christopher Mattern, M.D.     Post Admission Physician Evaluation: 1. Functional deficits secondary  to right cerebellar infarct. 2. Patient is admitted to receive collaborative, interdisciplinary care between the physiatrist, rehab nursing staff, and therapy team. 3. Patient's level of medical complexity and substantial therapy needs in context of that medical necessity cannot be provided at a lesser intensity of care such as a SNF. 4. Patient has experienced substantial functional loss from his/her baseline which was documented above under the "Functional History" and "Functional Status" headings.  Judging by the patient's diagnosis, physical exam, and functional history, the patient has potential for functional progress which will result in measurable gains while on inpatient rehab.  These gains will be of substantial and practical use upon discharge  in facilitating mobility and self-care at the household level. 5. Physiatrist will provide 24 hour management of medical needs as well as oversight of the therapy plan/treatment and provide guidance as appropriate regarding the interaction of the two. 6. 24 hour rehab nursing will assist with bladder management, bowel management, safety, skin/wound care, disease management, medication administration, pain management and patient education  and help integrate therapy concepts, techniques,education, etc. 7. PT will assess and treat for/with: Lower extremity strength, range of motion, stamina, balance, functional mobility, safety, adaptive techniques and equipment, NMR, vestibular deficits, education.   Goals are: supervision. 8. OT will assess and treat for/with: ADL's, functional mobility, safety, upper extremity strength, adaptive techniques and equipment, NMR, vestibular deficits, education.   Goals are:  supervision to min assist.. 9. SLP will assess and treat for/with: speech intelligibility, swallowing.  Goals are: mod I to min assist. 10. Case Management and Social Worker will assess and treat for psychological issues and discharge planning. 11. Team conference will be held weekly to assess progress toward goals and to determine barriers to discharge. 12. Patient will receive at least 3 hours of therapy per day at least 5 days per week. 13. ELOS: 2 weeks      Prognosis:  excellent   Medical Problem List and Plan: 1. 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 DVT Prophylaxis/Anticoagulation: Xarelto 15 mg daily. 3. Mood: Ativan 0.5 mg every 6 hours as needed 4. Neuropsych: This patient is capable of making decisions on his/her own behalf. 5. Dysphagia. Gastrostomy tube placed 10/15/2012. Followup speech therapy monitor for any aspiration. Speech and secretion control showing improvement. 6. Hypothyroidism. Synthroid 7. Atrial fibrillation. Continue Xarelto as directed. Cardiac rate controlled 8. Esophageal candidiasis. Diflucan completed 9. Diabetes mellitus with peripheral neuropathy. Continue sliding scale  Zachary T. Swartz, MD, FAAPMR  10/17/2012 

## 2012-10-17 NOTE — Plan of Care (Signed)
Overall Plan of Care [facility]) Patient Details Name: Joe Snow MRN: [id number] DOB: 12-15-1923  Diagnosis:  Embolic brainstem and right cerebellar infarct  Co-morbidities: htn, severe dysphagia,   Functional Problem List  Patient demonstrates impairments in the following areas: Balance, Cognition, Endurance, Motor, Pain and Safety  Basic ADL's: eating, grooming, bathing, dressing and toileting Advanced ADL's: simple meal preparation  Transfers:  bed mobility, bed to chair, toilet, tub/shower, car and furniture Locomotion:  ambulation, wheelchair mobility and stairs  Additional Impairments:  Swallowing and Communication  expression  Anticipated Outcomes Item Anticipated Outcome  Eating/Swallowing  Min A   Basic self-care  supervision  Tolieting  supervision  Bowel/Bladder  Mod I  Transfers  Supervision  Locomotion  Supervision   Communication  Mod I  Cognition    Pain  <3  Safety/Judgment    Other     Therapy Plan: PT Intensity: Minimum of 1-2 x/day ,45 to 90 minutes PT Frequency: 5 out of 7 days PT Duration Estimated Length of Stay: 2 weeks OT Intensity: Minimum of 1-2 x/day, 45 to 90 minutes OT Frequency: 5 out of 7 days OT Duration/Estimated Length of Stay: 2weeks SLP Intensity: Minumum of 1-2 x/day, 30 to 90 minutes SLP Frequency: 5 out of 7 days SLP Duration/Estimated Length of Stay: 2 weeks    Team Interventions: Item RN PT OT SLP SW TR Other  Self Care/Advanced ADL Retraining   x      Neuromuscular Re-Education  x x x     Therapeutic Activities  x x x     UE/LE Strength Training/ROM  x x      UE/LE Coordination Activities  x x      Visual/Perceptual Remediation/Compensation         DME/Adaptive Equipment Instruction  x x      Therapeutic Exercise  x x      Balance/Vestibular Training  x x      Patient/Family Education  x x x     Cognitive Remediation/Compensation  x x      Functional Mobility Training  x x      Ambulation/Gait Training   x       Stair Training  x       Wheelchair Propulsion/Positioning  x       Functional Statistician  x       Community Reintegration  x x      Dysphagia/Aspiration Printmaker    x     Speech/Language Facilitation    x     Bladder Management x        Bowel Management x        Disease Management/Prevention x        Pain Management x x x      Medication Management x        Skin Care/Wound Management   x      Splinting/Orthotics  x       Discharge Planning  x x x     Psychosocial Support   x x                            Team Discharge Planning: Destination: PT-Home ,OT- Home , SLP-Home Projected Follow-up: PT-Home health PT;Outpatient PT, OT-  Home health OT, SLP-Outpatient SLP Projected Equipment Needs: PT- , OT-  , SLP-None recommended by SLP Patient/family involved in discharge planning: PT- Patient;Family member/caregiver,  OT-Patient;Family member/caregiver, SLP-Family member/caregiver  MD ELOS: 2  weeks Medical Rehab Prognosis:  Excellent Assessment: The patient has been admitted for CIR therapies. The team will be addressing, functional mobility, strength, stamina, balance, safety, adaptive techniques/equipment, self-care, bowel and bladder mgt, patient and caregiver education, NMR, speech, communication, cognitive deficits, swallowing, vestibular needs. Goals have been set at min assist for swallowing, mod I to supervision for mobility and self-care.  Ranelle Oyster, MD, [place] [facility] .      See Team Conference Notes for weekly updates to the plan of care

## 2012-10-17 NOTE — H&P (Signed)
Physical Medicine and Rehabilitation Admission H&P    No chief complaint on file. : HPI: Joe Snow is a 79 y.o. right-handed male with documented history of hypertension, diabetes mellitus and atrial fibrillation off Coumadin due to diverticulitis with GI bleeding December 2013. Patient admitted 09/30/2012 with right-sided weakness and facial droop. He was initially taken to [facility] with cranial CT scan showing no acute intracranial abnormality. He was transferred to [facility] for further management. Patient did receive TPA at [facility]. Neurology service followup with workup ongoing . MRI of the brain 3/4 showed Small acute non hemorrhagic infarcts inferior aspect of  the right cerebellum and right posterior lateral aspect of the  medulla.  Carotid Dopplers with no ICA stenosis. Cardiology followup for history of atrial fibrillation and and echocardiogram completed showing ejection fraction 65% and normal systolic function. Placed on Xarelto for atrial fibrillation as well as stroke prophylaxis. Patient noted on 10/07/2012 with some increased confusion as well his swallowing difficulties MRI of the brain completed showing very been extension of the prior infarct with new small infarcts anterior right cerebellum and posterior right cerebellum. He was advised to continue present regimen with Xarelto. Patient with intermittent bouts of shortness of breath chest x-ray with no acute changes. ABGs initially consistent with hypoxia and chronic retention. Discussed with pulmonary in regards to ABGs and given that he intermittently is waking up gasping, concerned that he might have had a component of sleep apnea and placed on CPAP. Followup speech therapy and findings of severe oral pharyngeal dysphagia as well as dysarthria with swallow study completed advised patient to be n.p.o. with nasogastric tube placed. There was some concern of possible need for gastrostomy tube  and followup gastroenterology Dr. Leone Payor . There was finding of esophageal candidiasis and placed on Diflucan x14 days . Patient was admitted inpatient rehabilitation services 10/09/2012 with slow progressive gains. He continued to have issues in regards to handling his secretions as well as drops in oxygen saturations. Pulmonary services was reconsulted and his CPAP was discontinued. It was felt that his respiratory issues were out of result of his severe dysphagia and secretion management. There was some concern that he possibly may need tracheostomy as well as gastrostomy feeding tube until his swallowing difficulties and pulmonary issues resolved. He was discharged to acute care services 10/11/2012 and gastrostomy tube placed 10/15/2012 for nutritional support the patient remained n.p.o. He appear to be doing much better in the way of handling his secretions and it felt best at this time to hold on tracheostomy tube and monitor closely. Therapies are resumed and it was felt patient should be readmitted back to inpatient rehabilitation services for ongoing comprehensive therapy. Occupational therapy evaluation completed 10/01/2012 with recommendations for physical medicine rehabilitation consult to consider inpatient rehabilitation services  Review of Systems  Cardiovascular: Positive for palpitations.  Musculoskeletal: Positive for myalgias.  All other systems reviewed and are negative    Past Medical History  Diagnosis Date  . Hypertension associated with diabetes   . Diabetes   . Atrial fibrillation   . Embolic stroke involving left vertebral artery 10/02/2012  . Dysphagia, pharyngeal-neurogenic 10/05/2012  . Dysrhythmia     Conjenatle A-Fib   Past Surgical History  Procedure Laterality Date  . Prostate surgery N/A 1995    estimate  . Tonsillectomy Bilateral 1930s  . Esophagogastroduodenoscopy N/A 10/05/2012    Procedure: ESOPHAGOGASTRODUODENOSCOPY (EGD);  Surgeon: Iva Boop, MD;   Location: [facility] ENDOSCOPY;  Service: Endoscopy;  Laterality: N/A;  . Peg placement N/A 10/15/2012    Procedure: PERCUTANEOUS ENDOSCOPIC GASTROSTOMY (PEG) PLACEMENT;  Surgeon: Beverley Fiedler, MD;  Location: [facility] ENDOSCOPY;  Service: Gastroenterology;  Laterality: N/A;   History reviewed. No pertinent family history. Social History:  reports that he quit smoking about 34 years ago. His smoking use included Cigarettes. He smoked 0.00 packs per day. He has never used smokeless tobacco. He reports that he drinks about 1.0 ounces of alcohol per week. He reports that he does not use illicit drugs. Allergies:  Allergies  Allergen Reactions  . Metoprolol Shortness Of Breath  . Amlodipine Other (See Comments)    Lethargy    Medications Prior to Admission  Medication Sig Dispense Refill  . feeding supplement (GLUCERNA SHAKE) LIQD Take 237 mLs by mouth 2 (two) times daily between meals.  60 Can  2  . insulin detemir (LEVEMIR) 100 UNIT/ML injection Inject 20-25 Units into the skin 2 (two) times daily.      Marland Kitchen levothyroxine (SYNTHROID, LEVOTHROID) 88 MCG tablet Take 88 mcg by mouth daily.      Marland Kitchen lisinopril (PRINIVIL,ZESTRIL) 5 MG tablet Take 1 tablet (5 mg total) by mouth daily.  30 tablet  2  . lovastatin (MEVACOR) 10 MG tablet Take 10 mg by mouth daily.      . montelukast (SINGULAIR) 10 MG tablet Take 10 mg by mouth daily.      . pioglitazone (ACTOS) 30 MG tablet Take 30 mg by mouth daily.      . prednisoLONE acetate (PRED FORTE) 1 % ophthalmic suspension Place 1 drop into the left eye daily as needed (flares).      . Rivaroxaban (XARELTO) 15 MG TABS tablet Take 1 tablet (15 mg total) by mouth daily with supper.  30 tablet  2  . sertraline (ZOLOFT) 25 MG tablet Take 25 mg by mouth daily.      Marland Kitchen trifluridine (VIROPTIC) 1 % ophthalmic solution Place 1 drop into the left eye daily as needed (flares).        Home: Home Living Lives With: Spouse Available Help at Discharge: Family Type of Home: House Home  Access: Stairs to enter [employer] of Steps: 4 Entrance Stairs-Rails: None Home Layout: Two level;Able to live on main level with bedroom/bathroom Alternate Level Stairs-Number of Steps: 20; 15 to landing + 5 more Alternate Level Stairs-Rails: Left Bathroom Shower/Tub: Health visitor: Handicapped height Bathroom Accessibility: Yes How Accessible: Accessible via wheelchair;Accessible via walker Home Adaptive Equipment: None Additional Comments: Home remodeled   Functional History: Prior Function Able to Take Stairs?: Yes Driving: Yes Vocation: Part time employment  Functional Status:  Mobility: Bed Mobility Bed Mobility: Sitting - Scoot to Edge of Bed;Supine to Sit Supine to Sit: 4: Min assist;With rails;HOB elevated Sitting - Scoot to [place] of Bed: 5: Supervision Transfers Transfers: Sit to Stand;Stand to Sit Sit to Stand: 1: +2 Total assist;With upper extremity assist;From bed Sit to Stand: Patient Percentage: 70% Stand to Sit: 1: +2 Total assist;With upper extremity assist;To chair/3-in-1 Stand to Sit: Patient Percentage: 70% Ambulation/Gait Ambulation/Gait Assistance: 1: +2 Total assist Ambulation/Gait: Patient Percentage: 80% Ambulation Distance (Feet): 10 Feet Assistive device: 2 person hand held assist Ambulation/Gait Assistance Details: 2 people utilized for safety and chair follow.  pt tends to lean to R side and has difficulty wt shifting to L side.  pt with narrow BOS and tends to keep side stepping R even with cueing for LE positioning.   Gait Pattern: Step-to  pattern;Decreased step length - right;Decreased weight shift to left;Ataxic;Lateral trunk lean to right;Narrow base of support Stairs: No Wheelchair Mobility Wheelchair Mobility: No  ADL: ADL Upper Body Bathing: Simulated;Minimal assistance Where Assessed - Upper Body Bathing: Unsupported sitting Lower Body Bathing: Simulated;Moderate assistance Where Assessed - Lower Body  Bathing: Supported sit to stand Upper Body Dressing: Simulated;Minimal assistance Where Assessed - Upper Body Dressing: Unsupported sitting Lower Body Dressing: Performed;Moderate assistance Where Assessed - Lower Body Dressing: Supported sit to stand Toilet Transfer: Chief of Staff Method: Sit to Barista:  (bed to chair) Equipment Used: Gait belt Transfers/Ambulation Related to ADLs: Pt ambulated from bed to sink with +2 total assist (~15 ft). Pt leans to Right side and tends to veer right when ambulating.   ADL Comments: Pt donned socks sitting EOB with assist for balance (pt leans right) and assist to adust undergarment up over hips once in standing (was already wearing undergarment on arrival just need to pull further up over hips). Performed standing balance activities at sink with manual and verbal cues to maintain balance and upright posture.    Cognition: Cognition Arousal/Alertness: Awake/alert Orientation Level: Oriented X4 Cognition Overall Cognitive Status: Impaired Area of Impairment: Memory;Safety/judgement Arousal/Alertness: Awake/alert Orientation Level: Appears intact for tasks assessed Behavior During Session: [facility] for tasks performed Memory Deficits: pt has some difficulty with STM Safety/Judgement: Decreased safety judgement for tasks assessed;Impulsive;Decreased awareness of need for assistance  Physical Exam: Blood pressure 169/88, pulse 98, temperature 97.8 F (36.6 C), temperature source Oral, resp. rate 20, height 5' 11.5" (1.816 m), weight 84.324 kg (185 lb 14.4 oz), SpO2 92.00%. Physical Exam:  Blood pressure 130/74, pulse 85, temperature 97.9 F (36.6 C), temperature source Oral, resp. rate 18, height 5\' 11"  (1.803 m), weight 89.6 kg (197 lb 8.5 oz), SpO2 97.00%.  Vitals reviewed.  Constitutional: He appears well-developed.  HENT:  Head: Normocephalic.  Eyes: EOM are normal.  Neck: Normal range of  motion. Neck supple. No thyromegaly present.  Cardiovascular:  Cardiac rate controlled  Pulmonary/Chest: Breath sounds normal. No respiratory distress.  Abdominal: Soft. Bowel sounds are normal. He exhibits no distension. Gastrostomy tube in place with minimal drainage. Musculoskeletal: He exhibits no edema.  Neurological: He is alert.  Patient is alert. He was able to name person. Speech is dysarthric. Follows all commands. Right-sided limb ataxia in right arm and leg. Fair sitting balance while in the chair. Has reasonable insight and awareness. No gross sensory deficits. Handling secretions fairly well.    Results for orders placed during the hospital encounter of 10/11/12 (from the past 48 hour(s))  GLUCOSE, CAPILLARY     Status: Abnormal   Collection Time    10/15/12 11:40 AM      Result Value Range   Glucose-Capillary 198 (*) 70 - 99 mg/dL  GLUCOSE, CAPILLARY     Status: Abnormal   Collection Time    10/15/12  4:27 PM      Result Value Range   Glucose-Capillary 168 (*) 70 - 99 mg/dL  GLUCOSE, CAPILLARY     Status: Abnormal   Collection Time    10/15/12  7:29 PM      Result Value Range   Glucose-Capillary 163 (*) 70 - 99 mg/dL  GLUCOSE, CAPILLARY     Status: Abnormal   Collection Time    10/15/12 11:51 PM      Result Value Range   Glucose-Capillary 138 (*) 70 - 99 mg/dL  GLUCOSE, CAPILLARY  Status: Abnormal   Collection Time    10/16/12  3:49 AM      Result Value Range   Glucose-Capillary 131 (*) 70 - 99 mg/dL  GLUCOSE, CAPILLARY     Status: Abnormal   Collection Time    10/16/12  7:33 AM      Result Value Range   Glucose-Capillary 142 (*) 70 - 99 mg/dL   Comment 1 Notify RN    GLUCOSE, CAPILLARY     Status: Abnormal   Collection Time    10/16/12 11:12 AM      Result Value Range   Glucose-Capillary 164 (*) 70 - 99 mg/dL   Comment 1 Notify RN    GLUCOSE, CAPILLARY     Status: Abnormal   Collection Time    10/16/12  4:04 PM      Result Value Range    Glucose-Capillary 168 (*) 70 - 99 mg/dL   Comment 1 Notify RN    GLUCOSE, CAPILLARY     Status: Abnormal   Collection Time    10/16/12  8:03 PM      Result Value Range   Glucose-Capillary 167 (*) 70 - 99 mg/dL   Comment 1 Notify RN    GLUCOSE, CAPILLARY     Status: Abnormal   Collection Time    10/17/12 12:23 AM      Result Value Range   Glucose-Capillary 147 (*) 70 - 99 mg/dL   Comment 1 Notify RN    GLUCOSE, CAPILLARY     Status: Abnormal   Collection Time    10/17/12  4:16 AM      Result Value Range   Glucose-Capillary 161 (*) 70 - 99 mg/dL   Comment 1 Notify RN    MAGNESIUM     Status: None   Collection Time    10/17/12  5:15 AM      Result Value Range   Magnesium 1.7  1.5 - 2.5 mg/dL  BASIC METABOLIC PANEL     Status: Abnormal   Collection Time    10/17/12  5:15 AM      Result Value Range   Sodium 139  135 - 145 mEq/L   Potassium 4.8  3.5 - 5.1 mEq/L   Chloride 103  96 - 112 mEq/L   CO2 29  19 - 32 mEq/L   Glucose, Bld 183 (*) 70 - 99 mg/dL   BUN 14  6 - 23 mg/dL   Creatinine, Ser 9.52 (*) 0.50 - 1.35 mg/dL   Calcium 9.5  8.4 - 84.1 mg/dL   GFR calc non Af Amer 44 (*) >90 mL/min   GFR calc Af Amer 50 (*) >90 mL/min   Comment:            The eGFR has been calculated     using the CKD EPI equation.     This calculation has not been     validated in all clinical     situations.     eGFR's persistently     <90 mL/min signify     possible Chronic Kidney Disease.  GLUCOSE, CAPILLARY     Status: Abnormal   Collection Time    10/17/12  7:28 AM      Result Value Range   Glucose-Capillary 177 (*) 70 - 99 mg/dL   Dg Chest Port 1 View  10/16/2012  *RADIOLOGY REPORT*  Clinical Data: Cough.  Evaluation for aspiration pneumonia.  PORTABLE CHEST - 1 VIEW  Comparison: One-view chest 10/12/2012.  Findings: Heart size is normal.  Aeration at the right lung base has improved.  Mild chronic interstitial coarsening is otherwise stable.  IMPRESSION:  1.  Improved aeration right  lung base without evidence for residual airspace disease. 2. Stable underlying chronic interstitial coarsening.   Original Report Authenticated By: Marin Roberts, M.D.     Post Admission Physician Evaluation: 1. Functional deficits secondary  to right cerebellar infarct. 2. Patient is admitted to receive collaborative, interdisciplinary care between the physiatrist, rehab nursing staff, and therapy team. 3. Patient's level of medical complexity and substantial therapy needs in context of that medical necessity cannot be provided at a lesser intensity of care such as a SNF. 4. Patient has experienced substantial functional loss from his/her baseline which was documented above under the "Functional History" and "Functional Status" headings.  Judging by the patient's diagnosis, physical exam, and functional history, the patient has potential for functional progress which will result in measurable gains while on inpatient rehab.  These gains will be of substantial and practical use upon discharge  in facilitating mobility and self-care at the household level. 5. Physiatrist will provide 24 hour management of medical needs as well as oversight of the therapy plan/treatment and provide guidance as appropriate regarding the interaction of the two. 6. 24 hour rehab nursing will assist with bladder management, bowel management, safety, skin/wound care, disease management, medication administration, pain management and patient education  and help integrate therapy concepts, techniques,education, etc. 7. PT will assess and treat for/with: Lower extremity strength, range of motion, stamina, balance, functional mobility, safety, adaptive techniques and equipment, NMR, vestibular deficits, education.   Goals are: supervision. 8. OT will assess and treat for/with: ADL's, functional mobility, safety, upper extremity strength, adaptive techniques and equipment, NMR, vestibular deficits, education.   Goals are:  supervision to min assist.. 9. SLP will assess and treat for/with: speech intelligibility, swallowing.  Goals are: mod I to min assist. 10. Case Management and Social Worker will assess and treat for psychological issues and discharge planning. 11. Team conference will be held weekly to assess progress toward goals and to determine barriers to discharge. 12. Patient will receive at least 3 hours of therapy per day at least 5 days per week. 13. ELOS: 2 weeks      Prognosis:  excellent   Medical Problem List and Plan: 1. 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 DVT Prophylaxis/Anticoagulation: Xarelto 15 mg daily. 3. Mood: Ativan 0.5 mg every 6 hours as needed 4. Neuropsych: This patient is capable of making decisions on his/her own behalf. 5. Dysphagia. Gastrostomy tube placed 10/15/2012. Followup speech therapy monitor for any aspiration. Speech and secretion control showing improvement. 6. Hypothyroidism. Synthroid 7. Atrial fibrillation. Continue Xarelto as directed. Cardiac rate controlled 8. Esophageal candidiasis. Diflucan completed 9. Diabetes mellitus with peripheral neuropathy. Continue sliding scale  Ranelle Oyster, MD, [place]  10/17/2012

## 2012-10-17 NOTE — Plan of Care (Signed)
Problem: RH PAIN MANAGEMENT Goal: RH STG PAIN MANAGED AT OR BELOW PT'S PAIN GOAL <3     

## 2012-10-18 ENCOUNTER — Inpatient Hospital Stay (HOSPITAL_COMMUNITY): Payer: PRIVATE HEALTH INSURANCE | Admitting: Occupational Therapy

## 2012-10-18 ENCOUNTER — Inpatient Hospital Stay (HOSPITAL_COMMUNITY): Payer: Medicare Other | Admitting: Speech Pathology

## 2012-10-18 ENCOUNTER — Inpatient Hospital Stay (HOSPITAL_COMMUNITY): Payer: PRIVATE HEALTH INSURANCE | Admitting: Physical Therapy

## 2012-10-18 DIAGNOSIS — I669 Occlusion and stenosis of unspecified cerebral artery: Secondary | ICD-10-CM

## 2012-10-18 DIAGNOSIS — E1165 Type 2 diabetes mellitus with hyperglycemia: Secondary | ICD-10-CM

## 2012-10-18 DIAGNOSIS — I6340 Cerebral infarction due to embolism of unspecified cerebral artery: Secondary | ICD-10-CM

## 2012-10-18 DIAGNOSIS — I69991 Dysphagia following unspecified cerebrovascular disease: Secondary | ICD-10-CM

## 2012-10-18 DIAGNOSIS — I4891 Unspecified atrial fibrillation: Secondary | ICD-10-CM

## 2012-10-18 LAB — CBC WITH DIFFERENTIAL/PLATELET
Basophils Absolute: 0 10*3/uL (ref 0.0–0.1)
Eosinophils Absolute: 0.2 10*3/uL (ref 0.0–0.7)
Lymphocytes Relative: 30 % (ref 12–46)
Lymphs Abs: 2.4 10*3/uL (ref 0.7–4.0)
MCH: 26.3 pg (ref 26.0–34.0)
Neutrophils Relative %: 53 % (ref 43–77)
Platelets: 166 10*3/uL (ref 150–400)
RBC: 4.91 MIL/uL (ref 4.22–5.81)
RDW: 16.4 % — ABNORMAL HIGH (ref 11.5–15.5)
WBC: 8 10*3/uL (ref 4.0–10.5)

## 2012-10-18 LAB — COMPREHENSIVE METABOLIC PANEL
ALT: 34 U/L (ref 0–53)
AST: 45 U/L — ABNORMAL HIGH (ref 0–37)
Alkaline Phosphatase: 102 U/L (ref 39–117)
GFR calc Af Amer: 51 mL/min — ABNORMAL LOW (ref >90)
Glucose, Bld: 165 mg/dL — ABNORMAL HIGH (ref 70–99)
Potassium: 4.1 mEq/L (ref 3.5–5.1)
Sodium: 139 mEq/L (ref 135–145)
Total Protein: 6.7 g/dL (ref 6.0–8.3)

## 2012-10-18 LAB — GLUCOSE, CAPILLARY
Glucose-Capillary: 159 mg/dL — ABNORMAL HIGH (ref 70–99)
Glucose-Capillary: 174 mg/dL — ABNORMAL HIGH (ref 70–99)
Glucose-Capillary: 145 mg/dL — ABNORMAL HIGH (ref 70–99)

## 2012-10-18 NOTE — Progress Notes (Signed)
Subjective/Complaints: No complaints. Slept well. No cough or breathing difficulties. A 12 point review of systems has been performed and if not noted above is otherwise negative.      Objective:    Blood pressure 165/84, pulse 82, temperature 98.4 F (36.9 C), temperature source Oral, resp. rate 20, height 5' 11.5" (1.816 m), weight 85.1 kg (187 lb 9.8 oz), SpO2 94.00%. Dg Chest Port 1 View  10/16/2012  *RADIOLOGY REPORT*  Clinical Data: Cough.  Evaluation for aspiration pneumonia.  PORTABLE CHEST - 1 VIEW  Comparison: One-view chest 10/12/2012.  Findings: Heart size is normal.  Aeration at the right lung base has improved.  Mild chronic interstitial coarsening is otherwise stable.  IMPRESSION:  1.  Improved aeration right lung base without evidence for residual airspace disease. 2. Stable underlying chronic interstitial coarsening.   Original Report Authenticated By: Marin Roberts, M.D.     Recent Labs  10/18/12 0635  WBC 8.0  HGB 12.9*  HCT 40.7  PLT 166    Recent Labs  10/17/12 0515 10/18/12 0635  NA 139 139  K 4.8 4.1  CL 103 101  GLUCOSE 183* 165*  BUN 14 15  CREATININE 1.41* 1.39*  CALCIUM 9.5 9.3   CBG (last 3)   Recent Labs  10/18/12 0009 10/18/12 0355 10/18/12 0733  GLUCAP 153* 172* 159*    Wt Readings from Last 3 Encounters:  10/18/12 85.1 kg (187 lb 9.8 oz)  10/17/12 84.324 kg (185 lb 14.4 oz)  10/17/12 84.324 kg (185 lb 14.4 oz)    Physical Exam:  Vital Signs: Constitutional: He appears well-developed.  HENT: some thrush present on tongue Head: Normocephalic.  Eyes: EOM are normal.  Neck: Normal range of motion. Neck supple. No thyromegaly present.  Cardiovascular:  Cardiac rate controlled  Pulmonary/Chest: Breath sounds normal. No respiratory distress. No wheezes, rales, rhonchi Abdominal: Soft. Bowel sounds are normal. He exhibits no distension. Gastrostomy tube in place with minimal drainage. Musculoskeletal: He exhibits no  edema.  Neurological: He is alert.  Patient is alert. He was able to name person. A little impulsive. Speech is dysarthric but intelligible. Follows all commands. Right-sided limb ataxia in right arm and leg. Fair sitting balance while in bed. Has reasonable insight and awareness. No gross sensory deficits. Handling secretions fairly well. Strength nearly 5/5 in all limbs.    Assessment/Plan: 1. Functional deficits secondary to thromboembolic right cerebellum and posterior lateral medullary infarcts which require 3+ hours per day of interdisciplinary therapy in a comprehensive inpatient rehab setting. Physiatrist is providing close team supervision and 24 hour management of active medical problems listed below. Physiatrist and rehab team continue to assess barriers to discharge/monitor patient progress toward functional and medical goals. FIM: FIM - Bathing Bathing Steps Patient Completed: Chest;Right Arm;Left Arm;Abdomen;Front perineal area;Buttocks;Right upper leg;Left upper leg Bathing: 4: Min-Patient completes 8-9 61f 10 parts or 75+ percent  FIM - Upper Body Dressing/Undressing Upper body dressing/undressing steps patient completed: Thread/unthread left sleeve of pullover shirt/dress;Thread/unthread right sleeve of pullover shirt/dresss;Pull shirt over trunk;Put head through opening of pull over shirt/dress Upper body dressing/undressing: 4: Steadying assist FIM - Lower Body Dressing/Undressing Lower body dressing/undressing steps patient completed: Thread/unthread right underwear leg;Thread/unthread left underwear leg;Thread/unthread right pants leg;Thread/unthread left pants leg Lower body dressing/undressing: 2: Max-Patient completed 25-49% of tasks  FIM - Toileting Toileting: 1: Total-Patient completed zero steps, helper did all 3  FIM - Toilet Transfers Toilet Transfers: 3-To toilet/BSC: Mod A (lift or lower assist);3-From toilet/BSC: Mod A (lift or lower  assist)  FIM - Bed/Chair  Transfer Bed/Chair Transfer: 4: Supine > Sit: Min A (steadying Pt. > 75%/lift 1 leg);3: Bed > Chair or W/C: Mod A (lift or lower assist)     Comprehension Comprehension Mode: Auditory Comprehension: 5-Follows basic conversation/direction: With extra time/assistive device  Expression Expression Mode: Verbal Expression: 5-Expresses complex 90% of the time/cues < 10% of the time  Social Interaction Social Interaction: 4-Interacts appropriately 75 - 89% of the time - Needs redirection for appropriate language or to initiate interaction.  Problem Solving Problem Solving Mode: Not assessed Problem Solving: 3-Solves basic 50 - 74% of the time/requires cueing 25 - 49% of the time  Memory Memory Mode: Not assessed Medical Problem List and Plan:  1. 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 DVT Prophylaxis/Anticoagulation: Xarelto 15 mg daily.  3. Mood: Ativan 0.5 mg every 6 hours as needed  4. Neuropsych: This patient is capable of making decisions on his/her own behalf.  5. Dysphagia. Gastrostomy tube placed 10/15/2012. Followup speech therapy monitor for any aspiration. Speech and secretion control showing improvement. Still NPO 6. Hypothyroidism. Synthroid  7. Atrial fibrillation. Continue Xarelto as directed. Cardiac rate controlled  8. Esophageal candidiasis. Diflucan completed--will resume with thrush noted on tongue 9. Diabetes mellitus with peripheral neuropathy. Continue sliding scale      LOS (Days) 1 A FACE TO FACE EVALUATION WAS PERFORMED  Joe Snow T 10/18/2012 9:16 AM

## 2012-10-18 NOTE — Progress Notes (Signed)
Patient information reviewed and entered into eRehab system by Paullette Mckain, RN, CRRN, PPS Coordinator.  Information including medical coding and functional independence measure will be reviewed and updated through discharge.     Per nursing patient was given "Data Collection Information Summary for Patients in Inpatient Rehabilitation Facilities with attached "Privacy Act Statement-Health Care Records" upon admission.  

## 2012-10-18 NOTE — Evaluation (Signed)
Occupational Therapy Assessment and Plan  Patient Details  Name: Joe Snow MRN: [id number] Date of Birth: April 02, 1924  OT Diagnosis: ataxia and muscle weakness (generalized) Rehab Potential: Rehab Potential: Good ELOS: 2weeks   Today's Date: 10/18/2012 Time: 9147-8295 Time Calculation (min): 60 min  Problem List:  Patient Active Problem List  Diagnosis  . Embolic stroke involving left vertebral artery  . Chronic a-fib  . Diabetes  . Essential hypertension, benign  . Shortness of breath  . Dysphagia, pharyngeal-neurogenic  . Esophageal candidiasis  . Hiccups  . COPD exacerbation  . OSA on CPAP  . CVA (cerebral infarction)  . Dysphagia, unspecified  . Acute respiratory failure  . Embolic cerebrovascular disease    Past Medical History:  Past Medical History  Diagnosis Date  . Hypertension associated with diabetes   . Diabetes   . Atrial fibrillation   . Embolic stroke involving left vertebral artery 10/02/2012  . Dysphagia, pharyngeal-neurogenic 10/05/2012  . Dysrhythmia     Conjenatle A-Fib   Past Surgical History:  Past Surgical History  Procedure Laterality Date  . Prostate surgery N/A 1995    estimate  . Tonsillectomy Bilateral 1930s  . Esophagogastroduodenoscopy N/A 10/05/2012    Procedure: ESOPHAGOGASTRODUODENOSCOPY (EGD);  Surgeon: Iva Boop, MD;  Location: [facility] ENDOSCOPY;  Service: Endoscopy;  Laterality: N/A;  . Peg placement N/A 10/15/2012    Procedure: PERCUTANEOUS ENDOSCOPIC GASTROSTOMY (PEG) PLACEMENT;  Surgeon: Beverley Fiedler, MD;  Location: [facility] ENDOSCOPY;  Service: Gastroenterology;  Laterality: N/A;    Assessment & Plan Clinical Impression: Patient is a 79 y.o. year old male right-handed male with documented history of hypertension, diabetes mellitus and atrial fibrillation off Coumadin due to diverticulitis with GI bleeding December 2013. Patient admitted 09/30/2012 with right-sided weakness and facial droop. He was initially taken to [facility] with cranial CT scan showing no acute intracranial abnormality. He was transferred to [facility] for further management. Patient did receive TPA at [facility]. Neurology service followup with workup ongoing . MRI of the brain 3/4 showed Small acute non hemorrhagic infarcts inferior aspect of  the right cerebellum and right posterior lateral aspect of the  medulla.  Carotid Dopplers with no ICA stenosis. Cardiology followup for history of atrial fibrillation and and echocardiogram completed showing ejection fraction 65% and normal systolic function. Placed on Xarelto for atrial fibrillation as well as stroke prophylaxis. Patient noted on 10/07/2012 with some increased confusion as well his swallowing difficulties MRI of the brain completed showing very been extension of the prior infarct with new small infarcts anterior right cerebellum and posterior right cerebellum. He was advised to continue present regimen with Xarelto. Patient with intermittent bouts of shortness of breath chest x-ray with no acute changes. ABGs initially consistent with hypoxia and chronic retention. Discussed with pulmonary in regards to ABGs and given that he intermittently is waking up gasping, concerned that he might have had a component of sleep apnea and placed on CPAP. Followup speech therapy and findings of severe oral pharyngeal dysphagia as well as dysarthria with swallow study completed advised patient to be n.p.o. with nasogastric tube placed. There was some concern of possible need for gastrostomy tube and followup gastroenterology Dr. Leone Payor . There was finding of esophageal candidiasis and placed on Diflucan x14 days . Patient was admitted inpatient rehabilitation services 10/09/2012 with slow progressive gains. He continued to have issues in regards to handling his secretions as well as drops in oxygen saturations. Pulmonary services was reconsulted and his  CPAP was discontinued. It was felt  that his respiratory issues were out of result of his severe dysphagia and secretion management. There was some concern that he possibly may need tracheostomy as well as gastrostomy feeding tube until his swallowing difficulties and pulmonary issues resolved. He was discharged to acute care services 10/11/2012 and gastrostomy tube placed 10/15/2012 for nutritional support the patient remained n.p.o. He appear to be doing much better in the way of handling his secretions and it felt best at this time to hold on tracheostomy tube and monitor closely. Therapies are resumed and it was felt patient should be readmitted back to inpatient rehabilitation services for ongoing comprehensive therapy.  Patient transferred to CIR on 10/17/2012 .    Patient currently requires mod to max A  with basic self-care skills and basic mobility secondary to muscle weakness, decreased cardiorespiratoy endurance, impaired timing and sequencing, ataxia and decreased coordination, decreased awareness, decreased safety awareness and decreased memory and decreased standing balance, decreased postural control, decreased balance strategies and difficulty maintaining precautions.  Prior to hospitalization, patient could complete ADL with independent .  Patient will benefit from skilled intervention to decrease level of assist with basic self-care skills and increase independence with basic self-care skills prior to discharge home with care partner.  Anticipate patient will require 24 hour supervision and follow up home health.  OT - End of Session Activity Tolerance: Tolerates 30+ min activity with multiple rests Endurance Deficit: Yes OT Assessment Rehab Potential: Good OT Plan OT Intensity: Minimum of 1-2 x/day, 45 to 90 minutes OT Frequency: 5 out of 7 days OT Duration/Estimated Length of Stay: 2weeks OT Treatment/Interventions: Balance/vestibular training;Cognitive remediation/compensation;Discharge planning;Disease  mangement/prevention;DME/adaptive equipment instruction;Functional mobility training;Neuromuscular re-education;Pain management;Patient/family education;Psychosocial support;Self Care/advanced ADL retraining;Therapeutic Activities;Therapeutic Exercise;UE/LE Strength taining/ROM;UE/LE Coordination activities;Wheelchair propulsion/positioning;Community reintegration OT Recommendation Patient destination: Home Follow Up Recommendations: Home health OT   Skilled Therapeutic Intervention   OT Evaluation Precautions/Restrictions  Precautions Precautions: Fall Precaution Comments: Abdominal Binder, PEG tube Restrictions Weight Bearing Restrictions: No General Chart Reviewed: Yes Family/Caregiver Present: No Vital Signs   Pain Pain Assessment Pain Assessment: No/denies pain Home Living/Prior Functioning Home Living Lives With: Spouse Available Help at Discharge: Family;Available 24 hours/day Type of Home: House Home Layout: Two level;Able to live on main level with bedroom/bathroom Bathroom Shower/Tub: Health visitor: Handicapped height Bathroom Accessibility: Yes How Accessible: Accessible via walker ADL   Vision/Perception  Vision - History Baseline Vision: Wears glasses all the time Visual History: Cataracts Patient Visual Report: No change from baseline Vision - Assessment Eye Alignment: Within Functional Limits Ocular Range of Motion: Within Functional Limits Tracking/Visual Pursuits: Able to track stimulus in all quads without difficulty Perception Perception: Within Functional Limits Praxis Praxis: Intact  Cognition Overall Cognitive Status: Appears within functional limits for tasks assessed Arousal/Alertness: Awake/alert Orientation Level: Oriented X4 Memory: Impaired Memory Impairment: Decreased recall of new information Awareness: Impaired Awareness Impairment: Emergent impairment Safety/Judgment: Impaired Sensation Sensation Light Touch:  Appears Intact Proprioception: Appears Intact Coordination Gross Motor Movements are Fluid and Coordinated: No Fine Motor Movements are Fluid and Coordinated: No Coordination and Movement Description: Mild RLE impaired coordination, ataxia and dysmetria Finger Nose Finger Test: ataxia and dysmetria Rt > Lt Motor  Motor Motor: Hemiplegia;Ataxia;Motor impersistence Mobility  Transfers Sit to Stand: 3: Mod assist Stand to Sit: 3: Mod assist  Trunk/Postural Assessment  Cervical Assessment Cervical Assessment: Within Functional Limits Thoracic Assessment Thoracic Assessment: Within Functional Limits Lumbar Assessment Lumbar Assessment: Within Functional Limits Postural Control Postural Control: Deficits  on evaluation (poor left weight shift, posterior pelvic tilt) Righting Reactions: delayed Protective Responses: delayed  Balance Dynamic Sitting Balance Dynamic Sitting - Level of Assistance: 4: Min assist Static Standing Balance Static Standing - Level of Assistance: 4: Min assist;3: Mod assist Dynamic Standing Balance Dynamic Standing - Level of Assistance: 2: Max assist Extremity/Trunk Assessment RUE Assessment RUE Assessment: Exceptions to [facility] RUE AROM (degrees) RUE Overall AROM Comments: WFL RUE Strength RUE Overall Strength Comments: strength grossly 3/5 overall, weak grasp LUE Assessment LUE Assessment:  (grossly 4/5 overall)  FIM:  FIM - Eating Eating Activity: 0: Activity did not occur FIM - Grooming Grooming Steps: Wash, rinse, dry face;Wash, rinse, dry hands Grooming: 3: Patient completes 2 of 4 or 3 of 5 steps FIM - Bathing Bathing Steps Patient Completed: Chest;Right Arm;Left Arm;Abdomen;Front perineal area;Buttocks;Right upper leg;Left upper leg Bathing: 4: Min-Patient completes 8-9 81f 10 parts or 75+ percent FIM - Upper Body Dressing/Undressing Upper body dressing/undressing steps patient completed: Thread/unthread left sleeve of pullover  shirt/dress;Thread/unthread right sleeve of pullover shirt/dresss;Pull shirt over trunk;Put head through opening of pull over shirt/dress Upper body dressing/undressing: 4: Steadying assist FIM - Lower Body Dressing/Undressing Lower body dressing/undressing steps patient completed: Thread/unthread right underwear leg;Thread/unthread left underwear leg;Thread/unthread right pants leg;Thread/unthread left pants leg Lower body dressing/undressing: 2: Max-Patient completed 25-49% of tasks FIM - Toileting Toileting: 1: Total-Patient completed zero steps, helper did all 3 FIM - Bed/Chair Transfer Bed/Chair Transfer: 4: Supine > Sit: Min A (steadying Pt. > 75%/lift 1 leg);3: Bed > Chair or W/C: Mod A (lift or lower assist) FIM - Toilet Transfers Toilet Transfers: 3-To toilet/BSC: Mod A (lift or lower assist);3-From toilet/BSC: Mod A (lift or lower assist)   Refer to Care Plan for Long Term Goals  Recommendations for other services: None  Discharge Criteria: Patient will be discharged from OT if patient refuses treatment 3 consecutive times without medical reason, if treatment goals not met, if there is a change in medical status, if patient makes no progress towards goals or if patient is discharged from hospital.  The above assessment, treatment plan, treatment alternatives and goals were discussed and mutually agreed upon: by patient  1:1 OT eval initiated with OT goals, purpose and role discussed. Self care retraining at sink level including bed mobility, stand pivot transfers, sit to stand, standing balance with attention to right hip and knee extension, simple problem solving, safety awareness, postural control with dynamic sitting balance, toileting , toilet transfers.  Roney Mans [facility] 10/18/2012, 11:08 AM

## 2012-10-18 NOTE — Progress Notes (Signed)
NUTRITION FOLLOW UP  Intervention:   1. Continue current regimen of Glucerna 1.2 formula @ goal rate of 70 mL/hr. 2. Continue free water flushes of 200 mL qid to provide additional 800 mL of water daily 3. RD to follow for nutrition care plan  Nutrition Dx:   Inadequate oral intake related to inability to eat as evidenced by NPO status, ongoing  Goal:   Pt to meet >/= 90% of their estimated nutrition needs; met  Monitor:   EN regimen & tolerance, weight, labs, I/O's  Admitting Diagnosis: difficulty handling secretions scheduled for trach / PEG placement  Assessment:   Pt SP preg tube placement 3/18. Per nurse, pt is tolerating tube feeds well and has had very little residual. Pt is currently up to goal rate of Glucerna 1.2 at 70 mL per hour. This rate provides 2016 kcal and 101 g of protein which meets 100% of patient's estimated needs.   Height: Ht Readings from Last 1 Encounters:  10/17/12 5' 11.5" (1.816 m)    Weight Status:   Wt Readings from Last 1 Encounters:  10/18/12 187 lb 9.8 oz (85.1 kg)    Re-estimated needs:  Kcal: 1900-2100 Protein: 90-110 Fluid: 1.9-2.1 L  Skin: WNL  Diet Order: NPO   Intake/Output Summary (Last 24 hours) at 10/18/12 1056 Last data filed at 10/18/12 0744  Gross per 24 hour  Intake      0 ml  Output    600 ml  Net   -600 ml    Last BM: none recorded   Labs:   Recent Labs Lab 10/11/12 1454  10/15/12 0315 10/17/12 0515 10/18/12 0635  NA 140  < > 141 139 139  K 3.7  < > 3.0* 4.8 4.1  CL 99  < > 102 103 101  CO2 32  < > 28 29 29   BUN 20  < > 11 14 15   CREATININE 1.23  < > 1.35 1.41* 1.39*  CALCIUM 9.5  < > 9.3 9.5 9.3  MG 2.1  --   --  1.7  --   PHOS 2.4  --   --   --   --   GLUCOSE 192*  < > 174* 183* 165*  < > = values in this interval not displayed.  CBG (last 3)   Recent Labs  10/18/12 0009 10/18/12 0355 10/18/12 0733  GLUCAP 153* 172* 159*    Scheduled Meds: . free water  200 mL Per Tube Q4H  .  insulin aspart  0-9 Units Subcutaneous Q4H  . levothyroxine  88 mcg Per Tube QAC breakfast  . rivaroxaban  15 mg Oral Q supper    Continuous Infusions: . feeding supplement (GLUCERNA 1.2 CAL) 1,000 mL (10/17/12 2255)    Ebbie Latus RD, LDN

## 2012-10-18 NOTE — Progress Notes (Signed)
Physical Therapy Note  Patient Details  Name: Joe Snow MRN: [id number] Date of Birth: [birth year] Today's Date: 10/18/2012  Time: 1330-1400 30 minutes  No c/o pain.  Pt asleep on PT arrival, required sternal rub and wet wash cloth on his face to awaken.  Pt able to gait with mod-max A with RW with max cuing for posture and to decrease scissoring with gait.  Multiple reps sit to stand with focus on anterior wt shift and proper UE/LE placement.  Pt improved during session with repetition.  Individual therapy   Prue Lingenfelter 10/18/2012, 1:58 PM

## 2012-10-18 NOTE — Evaluation (Signed)
Speech Language Pathology Assessment and Plan  Patient Details  Name: Joe Snow MRN: [id number] Date of Birth: 12-05-1923  SLP Diagnosis: Voice disorder;Dysphagia  Rehab Potential: Good ELOS: 2 weeks   Today's Date: 10/18/2012 Time: 1478-2956 Time Calculation (min): 60 min  Skilled Therapeutic Intervention: Administered BSE. Please see below for details. Educated pt and daughter on current swallowing function, pharyngeal strengthening exercises and goals of skilled SLP intervention. Both verbalized understanding of all information.   Problem List:  Patient Active Problem List  Diagnosis  . Embolic stroke involving left vertebral artery  . Chronic a-fib  . Diabetes  . Essential hypertension, benign  . Shortness of breath  . Dysphagia, pharyngeal-neurogenic  . Esophageal candidiasis  . Hiccups  . COPD exacerbation  . OSA on CPAP  . CVA (cerebral infarction)  . Dysphagia, unspecified  . Acute respiratory failure  . Embolic cerebrovascular disease   Past Medical History:  Past Medical History  Diagnosis Date  . Hypertension associated with diabetes   . Diabetes   . Atrial fibrillation   . Embolic stroke involving left vertebral artery 10/02/2012  . Dysphagia, pharyngeal-neurogenic 10/05/2012  . Dysrhythmia     Conjenatle A-Fib   Past Surgical History:  Past Surgical History  Procedure Laterality Date  . Prostate surgery N/A 1995    estimate  . Tonsillectomy Bilateral 1930s  . Esophagogastroduodenoscopy N/A 10/05/2012    Procedure: ESOPHAGOGASTRODUODENOSCOPY (EGD);  Surgeon: Iva Boop, MD;  Location: [facility] ENDOSCOPY;  Service: Endoscopy;  Laterality: N/A;  . Peg placement N/A 10/15/2012    Procedure: PERCUTANEOUS ENDOSCOPIC GASTROSTOMY (PEG) PLACEMENT;  Surgeon: Beverley Fiedler, MD;  Location: [facility] ENDOSCOPY;  Service: Gastroenterology;  Laterality: N/A;    Assessment / Plan / Recommendation Clinical Impression  Pt is an 79 y.o. right-handed male with documented  history of hypertension, diabetes mellitus and atrial fibrillation off Coumadin due to diverticulitis with GI bleeding December 2013. Patient admitted 09/30/2012 with right-sided weakness and facial droop. MRI of the brain 3/4 showed small acute non hemorrhagic infarcts inferior aspect of the right cerebellum and right posterior lateral aspect of the medulla. Patient noted on 10/07/2012 with some increased confusion as well his swallowing difficulties. MRI of the brain completed showing extension of the prior infarct with new small infarcts anterior right cerebellum and posterior right cerebellum. Follow-up speech therapy and findings of severe pharyngeal dysphagia with swallow study completed advised patient to be NPO with nasogastric tube placed. Of note, there were findings of esophageal candidiasis and placed on Diflucan x14 days. Patient admitted to [facility] 10/09/12 and continued to have issues in regards to handling his secretions as well as drops in oxygen saturations. Pulmonary services was reconsulted and his CPAP was discontinued. It was felt that his respiratory issues were out of result of his severe dysphagia and secretion management. There was some concern that he possibly may need tracheostomy as well as gastrostomy feeding tube until his swallowing difficulties and pulmonary issues resolved. He was discharged to acute care services 10/11/2012 and gastrostomy tube placed 10/15/2012 for nutritional support the patient remained n.p.o. He appears to be doing much better in the way of handling his secretions and it felt best at this time to hold on tracheostomy tube and monitor closely. Therapies are resumed and it was felt patient should be readmitted back to inpatient rehabilitation services for ongoing comprehensive therapy. Pt readmitted to CIR 10/17/12 and demonstrates moderate vocal cord dysfunction characterized by decreased vocal intensity and a hoarse vocal quality. Pt also demonstrates  a severe  pharyngeal dysphagia characterized by decreased hyoid-laryngeal excursion and decreased management of secretions. Pt continues to demonstrate an intermittent wet vocal quality and requires total A to self-monitor and correct with multiple throat clears. Pt independently utilizes a strong, productive cough to expectorate mucous/saliva via his oral cavity. Pt would benefit from skilled SLP intervention to maximize vocal cord function and overall swallowing function.    SLP Assessment  Patient will need skilled Speech Lanaguage Pathology Services during CIR admission    Recommendations  Diet Recommendations: NPO Medication Administration: Via alternative means Oral Care Recommendations: Oral care QID Patient destination: Home Follow up Recommendations: Outpatient SLP Equipment Recommended: None recommended by SLP    SLP Frequency 5 out of 7 days   SLP Treatment/Interventions Dysphagia/aspiration precaution training;Oral motor exercises;Patient/family education;Neuromuscular electrical stimulation;Therapeutic Activities;Therapeutic Exercise;Functional tasks    Pain No/Denies Pain  Short Term Goals: Week 1: SLP Short Term Goal 1 (Week 1): Pt will self-monitor and correct wet vocal quality with Mod A assist question cues.  SLP Short Term Goal 2 (Week 1): Pt will consume trials of ice chips with minimal overt s/s of aspiration in less than 50% of trials with Mod A to assess readiness for objective swallow study  See FIM for current functional status Refer to Care Plan for Long Term Goals  Recommendations for other services: None  Discharge Criteria: Patient will be discharged from SLP if patient refuses treatment 3 consecutive times without medical reason, if treatment goals not met, if there is a change in medical status, if patient makes no progress towards goals or if patient is discharged from hospital.  The above assessment, treatment plan, treatment alternatives and goals were discussed  and mutually agreed upon: by patient and by family  Juanette Urizar 10/18/2012, 10:01 AM

## 2012-10-18 NOTE — Evaluation (Signed)
Physical Therapy Assessment and Plan  Patient Details  Name: Joe Snow MRN: [id number] Date of Birth: November 15, 1923  PT Diagnosis: Abnormal posture, Abnormality of gait, Ataxia, Ataxic gait, Cognitive deficits, Difficulty walking and Muscle weakness Rehab Potential: Good ELOS: 2 weeks   Today's Date: 10/18/2012 Time: 8657-8469 Time Calculation (min): 55 min  Problem List:  Patient Active Problem List  Diagnosis  . Embolic stroke involving left vertebral artery  . Chronic a-fib  . Diabetes  . Essential hypertension, benign  . Shortness of breath  . Dysphagia, pharyngeal-neurogenic  . Esophageal candidiasis  . Hiccups  . COPD exacerbation  . OSA on CPAP  . CVA (cerebral infarction)  . Dysphagia, unspecified  . Acute respiratory failure  . Embolic cerebrovascular disease    Past Medical History:  Past Medical History  Diagnosis Date  . Hypertension associated with diabetes   . Diabetes   . Atrial fibrillation   . Embolic stroke involving left vertebral artery 10/02/2012  . Dysphagia, pharyngeal-neurogenic 10/05/2012  . Dysrhythmia     Conjenatle A-Fib   Past Surgical History:  Past Surgical History  Procedure Laterality Date  . Prostate surgery N/A 1995    estimate  . Tonsillectomy Bilateral 1930s  . Esophagogastroduodenoscopy N/A 10/05/2012    Procedure: ESOPHAGOGASTRODUODENOSCOPY (EGD);  Surgeon: Iva Boop, MD;  Location: [facility] ENDOSCOPY;  Service: Endoscopy;  Laterality: N/A;  . Peg placement N/A 10/15/2012    Procedure: PERCUTANEOUS ENDOSCOPIC GASTROSTOMY (PEG) PLACEMENT;  Surgeon: Beverley Fiedler, MD;  Location: [facility] ENDOSCOPY;  Service: Gastroenterology;  Laterality: N/A;    Assessment & Plan Clinical Impression: Patient is a 79 y.o. year old male with recent admission to the hospital on03/09/2012 with right-sided weakness and facial droop. He was initially taken to [facility] with cranial CT scan showing no acute intracranial abnormality. He was  transferred to [facility] for further management. Patient did receive TPA at [facility]. Neurology service followup with workup ongoing . MRI of the brain 3/4 showed Small acute non hemorrhagic infarcts inferior aspect of the right cerebellum and right posterior lateral aspect of the medulla. Carotid Dopplers with no ICA stenosis. Cardiology followup for history of atrial fibrillation and and echocardiogram completed showing ejection fraction 65% and normal systolic function. Placed on Xarelto for atrial fibrillation as well as stroke prophylaxis. Patient noted on 10/07/2012 with some increased confusion as well his swallowing difficulties MRI of the brain completed showing very been extension of the prior infarct with new small infarcts anterior right cerebellum and posterior right cerebellum. He was advised to continue present regimen with Xarelto. Patient with intermittent bouts of shortness of breath chest x-ray with no acute changes. ABGs initially consistent with hypoxia and chronic retention. Discussed with pulmonary in regards to ABGs and given that he intermittently is waking up gasping, concerned that he might have had a component of sleep apnea and placed on CPAP. Followup speech therapy and findings of severe oral pharyngeal dysphagia as well as dysarthria with swallow study completed advised patient to be n.p.o. with nasogastric tube placed. There was some concern of possible need for gastrostomy tube and followup gastroenterology Dr. Leone Payor . There was finding of esophageal candidiasis and placed on Diflucan x14 days . Patient was admitted inpatient rehabilitation services 10/09/2012 with slow progressive gains. He continued to have issues in regards to handling his secretions as well as drops in oxygen saturations. Pulmonary services was reconsulted and his CPAP was discontinued. It was felt that his respiratory issues were out  of result of his severe dysphagia and secretion  management. There was some concern that he possibly may need tracheostomy as well as gastrostomy feeding tube until his swallowing difficulties and pulmonary issues resolved. He was discharged to acute care services 10/11/2012 and gastrostomy tube placed 10/15/2012 for nutritional support the patient remained n.p.o. He appear to be doing much better in the way of handling his secretions and it felt best at this time to hold on tracheostomy tube and monitor closely. .  Patient transferred to CIR on 10/17/2012 .   Patient currently requires mod with mobility secondary to muscle weakness, unbalanced muscle activation, ataxia and decreased coordination, decreased attention, decreased awareness, decreased problem solving, decreased safety awareness, decreased memory and delayed processing and decreased sitting balance, decreased standing balance, decreased postural control and decreased balance strategies.  Prior to hospitalization, patient was independent  with mobility and lived with Spouse in a House home.  Home access is 2Stairs to enter.  Patient will benefit from skilled PT intervention to maximize safe functional mobility, minimize fall risk and decrease caregiver burden for planned discharge home with 24 hour supervision.  Anticipate patient will benefit from follow up HH at discharge.  PT - End of Session Activity Tolerance: Tolerates 30+ min activity with multiple rests Endurance Deficit: Yes Endurance Deficit Description: requires frequent rest breaks PT Assessment Rehab Potential: Good Barriers to Discharge: Inaccessible home environment Barriers to Discharge Comments: no rails on stairs PT Plan PT Intensity: Minimum of 1-2 x/day ,45 to 90 minutes PT Frequency: 5 out of 7 days PT Duration Estimated Length of Stay: 2 weeks PT Treatment/Interventions: Ambulation/gait training;Discharge planning;Functional mobility training;Therapeutic Activities;Wheelchair propulsion/positioning;Therapeutic  Exercise;Neuromuscular re-education;Balance/vestibular training;Cognitive remediation/compensation;DME/adaptive equipment instruction;Pain management;Splinting/orthotics;UE/LE Strength taining/ROM;Stair training;UE/LE Coordination activities;Patient/family education;Functional electrical stimulation;Community reintegration PT Recommendation Recommendations for Other Services: Neuropsych consult Follow Up Recommendations: Home health PT;Outpatient PT Patient destination: Home  Skilled Therapeutic Intervention Gait training with RW with max A, +2 for safety.  Pt requires assist for wt shift and cues to widen BOS and for upright posture.  Manual facilitation for wt shifts and R glute contraction as pt tends to collapse on R during stance phase.  Standing NMR for improved proprioception, coordination and R LE stance control with reciprocal tap ups with B UE support, wt shifts, mini squats all with max A.  R knee fatigues quickly requiring frequent rests.  PT Evaluation Precautions/Restrictions Precautions Precautions: Fall Precaution Comments: Abdominal Binder, PEG tube Restrictions Weight Bearing Restrictions: No Pain Pain Assessment Pain Assessment: No/denies pain Home Living/Prior Functioning Home Living Lives With: Spouse Available Help at Discharge: Family Type of Home: House Home Access: Stairs to enter Secretary/administrator of Steps: 2 Entrance Stairs-Rails: None Home Layout: Able to live on main level with bedroom/bathroom;Two level Bathroom Shower/Tub: Health visitor: Handicapped height Bathroom Accessibility: Yes How Accessible: Accessible via walker Home Adaptive Equipment: None Prior Function Level of Independence: Independent with basic ADLs;Independent with transfers;Independent with gait Able to Take Stairs?: Yes Driving: Yes Vocation: Part time employment  Cognition Overall Cognitive Status: Impaired Arousal/Alertness: Awake/alert Orientation  Level: Oriented X4 Attention: Selective;Sustained Sustained Attention: Impaired Sustained Attention Impairment: Functional basic;Verbal basic Selective Attention: Impaired Memory: Impaired Memory Impairment: Decreased recall of new information Awareness: Impaired Awareness Impairment: Emergent impairment Behaviors: Restless Safety/Judgment: Impaired Sensation Sensation Light Touch: Appears Intact Proprioception: Impaired by gross assessment Coordination Gross Motor Movements are Fluid and Coordinated: No Fine Motor Movements are Fluid and Coordinated: No Coordination and Movement Description: ataxia of trunk and B LEs and  UEs Finger Nose Finger Test: ataxia and dysmetria Rt > Lt Motor  Motor Motor: Abnormal tone;Abnormal postural alignment and control;Ataxia  Mobility Transfers Sit to Stand: 3: Mod assist Stand to Sit: 3: Mod assist Stand Pivot Transfers: 3: Mod assist Stand Pivot Transfer Details (indicate cue type and reason): increased assist needed due to ataxia, delayed processing, pt requires cues for sequencing and safety Locomotion  Ambulation Ambulation: Yes Ambulation/Gait Assistance: 1: +2 Total assist Ambulation Distance (Feet): 25 Feet Assistive device: 2 person hand held assist Ambulation/Gait Assistance Details: narrow BOS with scissoring, cues for upright posture due to fwd flexion of trunk in standing, cues to attend to task and follow directions, ataxia in B LEs, R knee fatigues easily with buckling when fatigued Stairs / Additional Locomotion Stairs: Yes Stairs Assistance: 3: Mod assist Stairs Assistance Details (indicate cue type and reason): required cues for safety, R knee with decreased eccentric control Stair Management Technique: Two rails Number of Stairs: 1 Wheelchair Mobility Wheelchair Mobility: Yes Wheelchair Assistance: 4: Min Education officer, museum: Both upper extremities Wheelchair Parts Management: Needs assistance Distance: 35'   Trunk/Postural Assessment  Cervical Assessment Cervical Assessment: Within Functional Limits Thoracic Assessment Thoracic Assessment: Within Functional Limits Lumbar Assessment Lumbar Assessment:  (fwd flexed in standing) Postural Control Postural Control: Deficits on evaluation Righting Reactions: delayed Protective Responses: delayed  Balance Static Sitting Balance Static Sitting - Level of Assistance: 5: Stand by assistance Dynamic Sitting Balance Dynamic Sitting - Level of Assistance: 4: Min assist Static Standing Balance Static Standing - Level of Assistance: 3: Mod assist Dynamic Standing Balance Dynamic Standing - Level of Assistance: 2: Max assist Extremity Assessment  RUE Assessment RUE Assessment: Exceptions to [facility] RUE AROM (degrees) RUE Overall AROM Comments: WFL RUE Strength RUE Overall Strength Comments: strength grossly 3/5 overall, weak grasp LUE Assessment LUE Assessment:  (grossly 4/5 overall) RLE Assessment RLE Assessment:  (knee 3-/5, ankle and hip 3/5) LLE Assessment LLE Assessment: Within Functional Limits  FIM:  FIM - Bed/Chair Transfer Bed/Chair Transfer: 3: Chair or W/C > Bed: Mod A (lift or lower assist);3: Bed > Chair or W/C: Mod A (lift or lower assist) FIM - Locomotion: Wheelchair Distance: 35' Locomotion: Wheelchair: 1: Travels less than 50 ft with minimal assistance (Pt.>75%) FIM - Locomotion: Ambulation Ambulation/Gait Assistance: 1: +2 Total assist Locomotion: Ambulation: 1: Two helpers FIM - Locomotion: Stairs Locomotion: Stairs: 1: Up and Down < 4 stairs with moderate assistance (Pt: 50 - 74%)   Refer to Care Plan for Long Term Goals  Recommendations for other services: Neuropsych  Discharge Criteria: Patient will be discharged from PT if patient refuses treatment 3 consecutive times without medical reason, if treatment goals not met, if there is a change in medical status, if patient makes no progress towards goals or if patient  is discharged from hospital.  The above assessment, treatment plan, treatment alternatives and goals were discussed and mutually agreed upon: by patient  [facility] Mcdowell [facility] 10/18/2012, 12:22 PM

## 2012-10-19 ENCOUNTER — Inpatient Hospital Stay (HOSPITAL_COMMUNITY): Payer: PRIVATE HEALTH INSURANCE | Admitting: Occupational Therapy

## 2012-10-19 ENCOUNTER — Inpatient Hospital Stay (HOSPITAL_COMMUNITY): Payer: Medicare Other | Admitting: Physical Therapy

## 2012-10-19 ENCOUNTER — Inpatient Hospital Stay (HOSPITAL_COMMUNITY): Payer: Medicare Other

## 2012-10-19 ENCOUNTER — Encounter (HOSPITAL_COMMUNITY): Payer: PRIVATE HEALTH INSURANCE | Admitting: Occupational Therapy

## 2012-10-19 LAB — GLUCOSE, CAPILLARY
Glucose-Capillary: 135 mg/dL — ABNORMAL HIGH (ref 70–99)
Glucose-Capillary: 173 mg/dL — ABNORMAL HIGH (ref 70–99)
Glucose-Capillary: 179 mg/dL — ABNORMAL HIGH (ref 70–99)
Glucose-Capillary: 190 mg/dL — ABNORMAL HIGH (ref 70–99)

## 2012-10-19 MED ORDER — CLONIDINE HCL 0.1 MG PO TABS
0.1000 mg | ORAL_TABLET | Freq: Three times a day (TID) | ORAL | Status: DC | PRN
  Filled 2012-10-19 (×2): qty 1

## 2012-10-19 MED ORDER — LISINOPRIL 10 MG PO TABS
10.0000 mg | ORAL_TABLET | Freq: Every day | ORAL | Status: DC
  Administered 2012-10-19 – 2012-10-20 (×2): 10 mg via ORAL
  Filled 2012-10-19 (×3): qty 1

## 2012-10-19 NOTE — Progress Notes (Signed)
Physical Therapy Note  Patient Details  Name: Joe Snow MRN: [id number] Date of Birth: 1923/08/31 Today's Date: 10/19/2012  1000-1055 (55 minutes) individual Pain: no complaint of pain Focus of treatment: therapeutic exercise focused on bilateral LE coordination/control/ activity tolerance; gait training; standing balance Treatment: Transfers squat/pivot min/mod assist; stand/turn mod/max for balance and sit to stand + max vcs for wc setup (assist with footrests); Nustep Level 4 x 10 minutes for bilateral LE coordination; sit to stand from mat X 5 with mod/max assist ; standing without AD max assist with posterior lean; standing performing shoulder arc X 2 minutes min/mod assist with improved anterior lean; gait 50 feet , 25 feet EVA walker mod assist followed by wc for safety with narrow BOS.    [name],JIM 10/19/2012, 10:12 AM

## 2012-10-19 NOTE — Progress Notes (Signed)
Occupational Therapy Session Note  Patient Details  Name: Joe Snow MRN: [id number] Date of Birth: 1923/09/21  Today's Date: 10/19/2012 Time: 0900-1000 Time Calculation (min): 60 min   Skilled Therapeutic Interventions/Progress Updates:    1:1 self care retraining at sink level due to continuous tube feeds. Focus on controlled transfers, sit to stand, standing balance with and without UE support self correction of balance and LOB with max A. Pt with forward and to the right weight shift - able to self correct but unable to maintain it with a functional task ie donning shirt. With practice, In standing pt able to hold balance without UE support for 5 sec before he begins to anterior lean. Pt participated in shaving himself today with extra time and with both hands with forearms resting on sink to steady UEs.  Therapy Documentation Precautions:  Precautions Precautions: Fall Precaution Comments: Abdominal Binder, PEG tube Restrictions Weight Bearing Restrictions: No General:   Vital Signs:   Pain: Pain Assessment Pain Assessment: No/denies pain  See FIM for current functional status  Therapy/Group: Individual Therapy  Roney Mans [facility] 10/19/2012, 12:26 PM

## 2012-10-19 NOTE — Progress Notes (Signed)
Occupational Therapy Session Note  Patient Details  Name: Joe Snow MRN: [id number] Date of Birth: 11/06/1923  Today's Date: 10/19/2012 Time: 4098-1191 Time Calculation (min): 45 min   Skilled Therapeutic Interventions/Progress Updates:    1:1 Pt in recliner in room with wife present. Pt demonstrated decreased memory of events in morning session or what we worked on. Pt continues to demonstrate poor safety awareness despite his balance deficits and poor recall of recommendations requiring mobility from one session to the next. Family reports he has no memory deficits - "however he seems a little confused today" reports his wife. Focus on sit to stands, standing pivots, standing balance while engaging in a game of checkers. Pt total cuing for sequence of game and to setup board. Pt able to maintain standing balance with min A to max A with fatigue. Pt with decreased awareness of loosing balance with activity and right LE not activating to remain at midline. Focus on weight shift to the left with reaching for items in the left field to correct significant lean to the right.  Therapy Documentation Precautions:  Precautions Precautions: Fall Precaution Comments: Abdominal Binder, PEG tube Restrictions Weight Bearing Restrictions: No Pain:  no c/o pain  See FIM for current functional status  Therapy/Group: Individual Therapy  Roney Mans [facility] 10/19/2012, 2:58 PM

## 2012-10-19 NOTE — Progress Notes (Signed)
Speech Language Pathology Daily Session Note  Patient Details  Name: Joe Snow MRN: [id number] Date of Birth: March 06, 1924  Today's Date: 10/19/2012 Time: 5621-3086 Time Calculation (min): 40 min  Short Term Goals: Week 1: SLP Short Term Goal 1 (Week 1): Pt will self-monitor and correct wet vocal quality with Mod A assist question cues.  SLP Short Term Goal 1 - Progress (Week 1): Progressing toward goal SLP Short Term Goal 2 (Week 1): Pt will consume trials of ice chips with minimal overt s/s of aspiration in less than 50% of trials with Mod A to assess readiness for objective swallow study SLP Short Term Goal 2 - Progress (Week 1): Progressing toward goal SLP Short Term Goal 3 - Progress (Week 1): Progressing toward goal  Skilled Therapeutic Interventions: Skilled dysphagia treatment providing moderate multimodal cues to demonstrate pharyngeal strengthening swallowing exercises with approx 30 swallows (five swallows with each moistened toothetee and single ice chip.)   Clinical indication of aspiration characterized by excessive coughing x2 during session occuring with larger amount of water via toothette and with ice chip bolus that was masticated thoroughly.  Pt required maximal verbal cues to wet phonation and necessity to clear his throat.  Spouse reports pt and his children have "always choked on water."    Wife present this session and both pt and spouse were educated to importance of oral care for decreasing bacteria aspiration, purpose of swallowing saliva effortfully to improve oropharyngeal musculature, rehab swallowing and use of spirometer to strengthen diaphragm to improve pulmonary clearance.  Pt expressed frustration with lack of progress re: swallowing, for which slp provided emotional support and encouragement to maximize rehab potential.   FIM:  Comprehension Comprehension Mode: Auditory Comprehension: 4-Understands basic 75 - 89% of the time/requires cueing 10 - 24% of  the time Expression Expression Mode: Verbal Expression: 5-Expresses basic 90% of the time/requires cueing < 10% of the time. Social Interaction Social Interaction: 3-Interacts appropriately 50 - 74% of the time - May be physically or verbally inappropriate. Problem Solving Problem Solving: 3-Solves basic 50 - 74% of the time/requires cueing 25 - 49% of the time Memory Memory: 3-Recognizes or recalls 50 - 74% of the time/requires cueing 25 - 49% of the time FIM - Eating Eating Activity: 0: Activity did not occur  Pain Pain Assessment Pain Assessment: No/denies pain  Therapy/Group: Individual Therapy  Donavan Burnet, MS [facility] SLP [phone]

## 2012-10-19 NOTE — Progress Notes (Addendum)
Subjective/Complaints: No complaints. Slept well. No cough or breathing difficulties. A 12 point review of systems has been performed and if not noted above is otherwise negative.      Objective:    Blood pressure 180/90, pulse 96, temperature 97.6 F (36.4 C), temperature source Oral, resp. rate 18, height 5' 11.5" (1.816 m), weight 84.1 kg (185 lb 6.5 oz), SpO2 97.00%. No results found.  Recent Labs  10/18/12 0635  WBC 8.0  HGB 12.9*  HCT 40.7  PLT 166    Recent Labs  10/17/12 0515 10/18/12 0635  NA 139 139  K 4.8 4.1  CL 103 101  GLUCOSE 183* 165*  BUN 14 15  CREATININE 1.41* 1.39*  CALCIUM 9.5 9.3   CBG (last 3)   Recent Labs  10/19/12 0010 10/19/12 0534 10/19/12 0807  GLUCAP 135* 182* 190*    Wt Readings from Last 3 Encounters:  10/19/12 84.1 kg (185 lb 6.5 oz)  10/17/12 84.324 kg (185 lb 14.4 oz)  10/17/12 84.324 kg (185 lb 14.4 oz)    Physical Exam:  Vital Signs: Constitutional: He appears well-developed.  HENT: some thrush present on tongue Head: Normocephalic.  Eyes: EOM are normal.  Neck: Normal range of motion. Neck supple. No thyromegaly present.  Cardiovascular:  Cardiac rate controlled  Pulmonary/Chest: Breath sounds normal. No respiratory distress. No wheezes, rales, rhonchi Abdominal: Soft. Bowel sounds are normal. He exhibits no distension. Gastrostomy tube in place with minimal drainage. Musculoskeletal: He exhibits no edema.  Neurological: He is alert.  Patient is alert. He was able to name person. A little impulsive still. Speech is dysarthric but intelligible. Follows all commands. Right-sided limb ataxia in right arm and leg. Fair sitting balance while in bed. Has reasonable insight and awareness. No gross sensory deficits. Handling secretions fairly well. Strength nearly 5/5 in all limbs.    Assessment/Plan: 1. 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 Physiatrist is providing close team supervision and 24 hour management of active medical problems listed below. Physiatrist and rehab team continue to assess barriers to discharge/monitor patient progress toward functional and medical goals. FIM: FIM - Bathing Bathing Steps Patient Completed: Chest;Right Arm;Left Arm;Abdomen;Front perineal area;Buttocks;Right upper leg;Left upper leg Bathing: 4: Min-Patient completes 8-9 55f 10 parts or 75+ percent  FIM - Upper Body Dressing/Undressing Upper body dressing/undressing steps patient completed: Thread/unthread left sleeve of pullover shirt/dress;Thread/unthread right sleeve of pullover shirt/dresss;Pull shirt over trunk;Put head through opening of pull over shirt/dress Upper body dressing/undressing: 4: Steadying assist FIM - Lower Body Dressing/Undressing Lower body dressing/undressing steps patient completed: Thread/unthread right underwear leg;Thread/unthread left underwear leg;Thread/unthread right pants leg;Thread/unthread left pants leg Lower body dressing/undressing: 2: Max-Patient completed 25-49% of tasks  FIM - Toileting Toileting: 1: Total-Patient completed zero steps, helper did all 3  FIM - Toilet Transfers Toilet Transfers: 3-To toilet/BSC: Mod A (lift or lower assist);3-From toilet/BSC: Mod A (lift or lower assist)  FIM - Bed/Chair Transfer Bed/Chair Transfer: 3: Chair or W/C > Bed: Mod A (lift or lower assist);3: Bed > Chair or W/C: Mod A (lift or lower assist)  FIM - Locomotion: Wheelchair Distance: 35' Locomotion: Wheelchair: 1: Travels less than 50 ft with minimal assistance (Pt.>75%) FIM - Locomotion: Ambulation Ambulation/Gait Assistance: 1: +2 Total assist Locomotion: Ambulation: 1: Two helpers  Comprehension Comprehension Mode: Auditory Comprehension: 5-Follows basic conversation/direction: With extra time/assistive  device  Expression Expression Mode:  Verbal Expression: 5-Expresses complex 90% of the time/cues < 10% of the time  Social Interaction Social Interaction: 4-Interacts appropriately 75 - 89% of the time - Needs redirection for appropriate language or to initiate interaction.  Problem Solving Problem Solving Mode: Not assessed Problem Solving: 3-Solves basic 50 - 74% of the time/requires cueing 25 - 49% of the time  Memory Memory Mode: Not assessed Memory: 3-Recognizes or recalls 50 - 74% of the time/requires cueing 25 - 49% of the time Medical Problem List and Plan:  1. 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 DVT Prophylaxis/Anticoagulation: Xarelto 15 mg daily.  3. Mood: Ativan 0.5 mg every 6 hours as needed  4. Neuropsych: This patient is capable of making decisions on his/her own behalf.  5. Dysphagia. Gastrostomy tube placed 10/15/2012. Followup speech therapy monitor for any aspiration. Speech and secretion control improved. Still NPO 6. Hypothyroidism. Synthroid  7. Atrial fibrillation. Continue Xarelto as directed. Cardiac rate fairly well controlled  8. Esophageal candidiasis. Diflucan completed--will resume with thrush noted on tongue 9. Diabetes mellitus with peripheral neuropathy. Continue sliding scale      LOS (Days) 2 A FACE TO FACE EVALUATION WAS PERFORMED  Eulalia Ellerman T 10/19/2012 8:17 AM

## 2012-10-20 ENCOUNTER — Inpatient Hospital Stay (HOSPITAL_COMMUNITY): Payer: Medicare Other | Admitting: Physical Therapy

## 2012-10-20 LAB — GLUCOSE, CAPILLARY
Glucose-Capillary: 193 mg/dL — ABNORMAL HIGH (ref 70–99)
Glucose-Capillary: 167 mg/dL — ABNORMAL HIGH (ref 70–99)
Glucose-Capillary: 194 mg/dL — ABNORMAL HIGH (ref 70–99)

## 2012-10-20 MED ORDER — LISINOPRIL 10 MG PO TABS
10.0000 mg | ORAL_TABLET | Freq: Two times a day (BID) | ORAL | Status: DC
  Administered 2012-10-20: 10 mg via ORAL
  Filled 2012-10-20 (×4): qty 1

## 2012-10-20 MED ORDER — POLYETHYLENE GLYCOL 3350 17 G PO PACK
17.0000 g | PACK | Freq: Once | ORAL | Status: AC
  Administered 2012-10-20: 17 g
  Filled 2012-10-20: qty 1

## 2012-10-20 NOTE — Progress Notes (Signed)
Subjective/Complaints: A little resltess last night and yesterday. States he was able to sleep.. A 12 point review of systems has been performed and if not noted above is otherwise negative.      Objective:    Blood pressure 160/91, pulse 73, temperature 98.1 F (36.7 C), temperature source Oral, resp. rate 17, height 5' 11.5" (1.816 m), weight 79.2 kg (174 lb 9.7 oz), SpO2 93.00%. No results found.  Recent Labs  10/18/12 0635  WBC 8.0  HGB 12.9*  HCT 40.7  PLT 166    Recent Labs  10/18/12 0635  NA 139  K 4.1  CL 101  GLUCOSE 165*  BUN 15  CREATININE 1.39*  CALCIUM 9.3   CBG (last 3)   Recent Labs  10/19/12 2342 10/20/12 0401 10/20/12 0713  GLUCAP 179* 193* 167*    Wt Readings from Last 3 Encounters:  10/20/12 79.2 kg (174 lb 9.7 oz)  10/17/12 84.324 kg (185 lb 14.4 oz)  10/17/12 84.324 kg (185 lb 14.4 oz)    Physical Exam:  Vital Signs: Constitutional: He appears well-developed.  HENT: some thrush present on tongue. Voice sounds a little wetter today Head: Normocephalic.  Eyes: EOM are normal.  Neck: Normal range of motion. Neck supple. No thyromegaly present.  Cardiovascular:  Cardiac rate controlled  Pulmonary/Chest: Breath sounds normal. No respiratory distress. No wheezes, rales, rhonchi Abdominal: Soft. Bowel sounds are normal. He exhibits no distension. Gastrostomy tube in place with minimal drainage. Musculoskeletal: He exhibits no edema.  Neurological: He is alert.  Patient is alert. He was able to name person.   impulsive still. Speech remains dysarthric but intelligible. Follows all commands. Right-sided limb ataxia in right arm and leg. Fair sitting balance while in bed. Has reasonable insight and awareness. No gross sensory deficits. Handling secretions fairly well. Strength nearly 5/5 in all limbs.    Assessment/Plan: 1. 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 Physiatrist is providing close team supervision and 24 hour management of active medical problems listed below. Physiatrist and rehab team continue to assess barriers to discharge/monitor patient progress toward functional and medical goals. FIM: FIM - Bathing Bathing Steps Patient Completed: Chest;Right Arm;Left Arm;Abdomen;Front perineal area;Buttocks;Right upper leg;Left upper leg Bathing: 4: Min-Patient completes 8-9 31f 10 parts or 75+ percent  FIM - Upper Body Dressing/Undressing Upper body dressing/undressing steps patient completed: Thread/unthread left sleeve of pullover shirt/dress;Thread/unthread right sleeve of pullover shirt/dresss;Pull shirt over trunk;Put head through opening of pull over shirt/dress Upper body dressing/undressing: 4: Steadying assist FIM - Lower Body Dressing/Undressing Lower body dressing/undressing steps patient completed: Thread/unthread right underwear leg;Thread/unthread left underwear leg;Thread/unthread right pants leg;Thread/unthread left pants leg Lower body dressing/undressing: 2: Max-Patient completed 25-49% of tasks  FIM - Toileting Toileting: 1: Total-Patient completed zero steps, helper did all 3  FIM - Toilet Transfers Toilet Transfers: 3-To toilet/BSC: Mod A (lift or lower assist);3-From toilet/BSC: Mod A (lift or lower assist)  FIM - Bed/Chair Transfer Bed/Chair Transfer: 3: Chair or W/C > Bed: Mod A (lift or lower assist);3: Bed > Chair or W/C: Mod A (lift or lower assist)  FIM - Locomotion: Wheelchair Distance: 35' Locomotion: Wheelchair: 1: Travels less than 50 ft with minimal assistance (Pt.>75%) FIM - Locomotion: Ambulation Ambulation/Gait Assistance: 1: +2 Total assist Locomotion: Ambulation: 1: Two helpers  Comprehension Comprehension Mode: Auditory Comprehension: 4-Understands basic 75 - 89% of the time/requires cueing 10 -  24% of  the time  Expression Expression Mode: Verbal Expression: 3-Expresses basic 50 - 74% of the time/requires cueing 25 - 50% of the time. Needs to repeat parts of sentences.  Social Interaction Social Interaction: 2-Interacts appropriately 25 - 49% of time - Needs frequent redirection.  Problem Solving Problem Solving Mode: Not assessed Problem Solving: 1-Solves basic less than 25% of the time - needs direction nearly all the time or does not effectively solve problems and may need a restraint for safety  Memory Memory Mode: Not assessed Memory: 2-Recognizes or recalls 25 - 49% of the time/requires cueing 51 - 75% of the time Medical Problem List and Plan:  1. 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 DVT Prophylaxis/Anticoagulation: Xarelto 15 mg daily.  3. Mood: Ativan 0.5 mg every 6 hours as needed  4. Neuropsych: This patient is capable of making decisions on his/her own behalf.  5. Dysphagia. Gastrostomy tube placed 10/15/2012. Followup speech therapy monitor for any aspiration. Speech and secretion control improved. Still NPO 6. Hypothyroidism. Synthroid  7. Atrial fibrillation. Continue Xarelto as directed. Cardiac rate fairly well controlled  8. Esophageal candidiasis. Diflucan completed--will resume with thrush noted on tongue 9. Diabetes mellitus with peripheral neuropathy. Continue sliding scale covg  10. HTN: bp still elevated but improved. Increase lisinopril to 10mg  bid    LOS (Days) 3 A FACE TO FACE EVALUATION WAS PERFORMED  Loraina Stauffer T 10/20/2012 8:46 AM

## 2012-10-20 NOTE — Progress Notes (Signed)
Patient restless with multiple attempts to get OOB without assistance. Bedalarm in use and wife at bedside providing hands on assistance. Wife reports restless since lunch time, also with frequent cough. Cough sounds dry and raspy, occasionally productive. Sputum white and clear. Wet voice at times, using [place] to suction at back of mouth. Audible wheeze noted-PRN xopenex given at 0013, with some relief. At 2355 PRN ativan given for anxiety. Wife reports patient resting much better after above meds given. Wife or staff assists with urinal, can be incont. 20cc's of residual checked at 0030. Abdominal binder in place to protect peg site. HOB > 30 degrees. Increased confusion as day went on. Joe Snow A

## 2012-10-20 NOTE — Progress Notes (Signed)
Physical Therapy Note  Patient Details  Name: Joe Snow MRN: [id number] Date of Birth: 1923/08/17 Today's Date: 10/20/2012  1445-1530 (30 minutes) individual Pain: no complaint of pain Focus of treatment: gait training; standing alignment; wc mobility  Treatment: Transfers SPT mod assist; wc mobility 120 feet with max cues for use of UEs + min/mod assist for steeting; gait 75 feet EVA walker mod assist with adducted RT LE; sit to stand with max tactile cues for hand placement min/mod assist ; standing using rainbow arc with lean to right and /or posterior (mod assist).  Agustina Witzke,JIM 10/20/2012, 3:17 PM

## 2012-10-21 ENCOUNTER — Inpatient Hospital Stay (HOSPITAL_COMMUNITY): Payer: Medicare Other | Admitting: Physical Therapy

## 2012-10-21 ENCOUNTER — Inpatient Hospital Stay (HOSPITAL_COMMUNITY): Payer: Medicare Other

## 2012-10-21 ENCOUNTER — Inpatient Hospital Stay (HOSPITAL_COMMUNITY): Payer: Medicare Other | Admitting: Speech Pathology

## 2012-10-21 LAB — GLUCOSE, CAPILLARY
Glucose-Capillary: 167 mg/dL — ABNORMAL HIGH (ref 70–99)
Glucose-Capillary: 190 mg/dL — ABNORMAL HIGH (ref 70–99)
Glucose-Capillary: 211 mg/dL — ABNORMAL HIGH (ref 70–99)

## 2012-10-21 MED ORDER — GLUCERNA 1.2 CAL PO LIQD
1000.0000 mL | ORAL | Status: DC
  Administered 2012-10-21 (×2): 1000 mL
  Filled 2012-10-21 (×9): qty 1000

## 2012-10-21 MED ORDER — INSULIN GLARGINE 100 UNIT/ML ~~LOC~~ SOLN
5.0000 [IU] | Freq: Two times a day (BID) | SUBCUTANEOUS | Status: DC
  Administered 2012-10-21 – 2012-10-24 (×7): 5 [IU] via SUBCUTANEOUS
  Filled 2012-10-21 (×10): qty 0.05

## 2012-10-21 MED ORDER — LISINOPRIL 10 MG PO TABS
15.0000 mg | ORAL_TABLET | Freq: Two times a day (BID) | ORAL | Status: DC
  Administered 2012-10-21 (×2): 15 mg via ORAL
  Filled 2012-10-21 (×5): qty 1

## 2012-10-21 NOTE — Progress Notes (Signed)
[facility] Individual Statement of Services  Patient Name:  Joe Snow  Date:  10/21/2012  Welcome to the [facility]patient [facility].  Our goal is to provide you with an individualized program based on your diagnosis and situation, designed to meet your specific needs.  With this comprehensive rehabilitation program, you will be expected to participate in at least 3 hours of rehabilitation therapies Monday-Friday, with modified therapy programming on the weekends.  Your rehabilitation program will include the following services:  Physical Therapy (PT), Occupational Therapy (OT), Speech Therapy (ST), 24 hour per day rehabilitation nursing, Therapeutic Recreaction (TR), Neuropsychology, Case Management (Social Worker), Rehabilitation Medicine, Nutrition Services and Pharmacy Services  Weekly team conferences will be held on Tuesdays to discuss your progress.  Your  Social Worker will talk with you frequently to get your input and to update you on team discussions.  Team conferences with you and your family in attendance may also be held.  Expected length of stay: 2 weeks  Overall anticipated outcome:  Supervision   Depending on your progress and recovery, your program may change.  Your  Social Worker will coordinate services and will keep you informed of any changes.  You Social Worker's name and contact numbers are listed  below.  The following services may also be recommended but are not provided by the [facility]:   Driving Evaluations  Home Health Rehabiltiation Services  Outpatient Rehabilitatation [facility]  Vocational Rehabilitation   Arrangements will be made to provide these services after discharge if needed.  Arrangements include referral to agencies that provide these services.  Your insurance has been verified to be:  Medicare and AARP Your primary doctor is:  Dr. Dario Guardian  Pertinent information will be shared with your doctor  and your insurance company.   Social Worker:  [place], [place] [phone] or ([phone]  Information discussed with and copy given to patient by: Amada Jupiter, 10/21/2012, 2:46 PM

## 2012-10-21 NOTE — Progress Notes (Signed)
Assisted to bathroom. Complained of constipation, at one point wouldn't leave bathroom. LBM 10/16/12. Paged Dr. Riley Kill for laxative. One time dose of miralax given at 2205 per orders and will address bowels in AM rounds. Family at bedside providing hands on assist. Restless night with minimal sleep. PRN ativan given at 0119. Patient restless and picking at things but, not agitated. Glucerna 1.2 via PEG at 70cc/hr with < 20cc;s residual. Voice wet at times. Using [place]on[place] to suction out mouth. Coughing up white/clear phlegm. Alfredo Martinez A

## 2012-10-21 NOTE — Progress Notes (Signed)
Speech Language Pathology Daily Session Note  Patient Details  Name: Joe Snow MRN: [id number] Date of Birth: September 27, 1923  Today's Date: 10/21/2012 Time: 1330-1430 Time Calculation (min): 60 min  Short Term Goals: Week 1: SLP Short Term Goal 1 (Week 1): Pt will self-monitor and correct wet vocal quality with Mod A assist question cues.  SLP Short Term Goal 1 - Progress (Week 1): Progressing toward goal SLP Short Term Goal 2 (Week 1): Pt will consume trials of ice chips with minimal overt s/s of aspiration in less than 50% of trials with Mod A to assess readiness for objective swallow study SLP Short Term Goal 2 - Progress (Week 1): Progressing toward goal SLP Short Term Goal 3 - Progress (Week 1): Progressing toward goal  Skilled Therapeutic Interventions: Treatment focused on dysphagia goals. SLP facilitated session by providing Min A to perform oral care prior to initiating pharyngeal strengthening exercises. SLP also provided Mod A verbal and tactile cues to perform pharyngeal strengthening exercises, which pt completed with noted difficulty with swallow initiation upon laryngeal palpation, in particular with the towel tuck exercise. Wife present and reports that he is clearing his throat throughout the day when his vocal quality sounds wet; however, during this session he required Max A verbal cues and demonstrations from SLP to clear his throat.    FIM:  Comprehension Comprehension Mode: Auditory Comprehension: 4-Understands basic 75 - 89% of the time/requires cueing 10 - 24% of the time Expression Expression Mode: Verbal Expression: 3-Expresses basic 50 - 74% of the time/requires cueing 25 - 50% of the time. Needs to repeat parts of sentences. Social Interaction Social Interaction: 2-Interacts appropriately 25 - 49% of time - Needs frequent redirection. Problem Solving Problem Solving: 2-Solves basic 25 - 49% of the time - needs direction more than half the time to initiate,  plan or complete simple activities Memory Memory: 2-Recognizes or recalls 25 - 49% of the time/requires cueing 51 - 75% of the time FIM - Eating Eating Activity: 1: Helper performs IV, parenteral, or tube feeding  Pain Pain Assessment Pain Assessment: No/denies pain  Therapy/Group: Individual Therapy  Maxcine Ham 10/21/2012, 4:01 PM

## 2012-10-21 NOTE — Progress Notes (Signed)
Occupational Therapy Session Note  Patient Details  Name: Joe Snow MRN: [id number] Date of Birth: 31-Oct-1923  Today's Date: 10/21/2012 Time: 0930-1030 Time Calculation (min): 60 min   Skilled Therapeutic Interventions/Progress Updates:    ADL re-training at sink level. Focus on controlled transfers, sit to stand, standing balance with and without UE support self correction of balance. Pt with backward and to the right weight shift during standing- able to self correct with verbal cues and only able to hold correction for 5-10 seconds. Pt completed Bil UE strengthening exercises. See below for exercises. Pt placed in recliner at end of session due to fatigue. Pt needed verbal and physical cues during transfer with walker for weightshift, technique, and walker control. Wife present at end of session.   Therapy Documentation Precautions:  Precautions Precautions: Fall Precaution Comments: Abdominal Binder, PEG tube Restrictions Weight Bearing Restrictions: No Pain: Pain Assessment Pain Assessment: Faces Faces Pain Scale: No hurt   10/21/12 1100  General Exercises - Upper Extremity  Shoulder Flexion Strengthening;15 reps;Seated;Bar weights/barbell  Bar Weights/Barbell (Shoulder Flexion) 2 lbs  Shoulder Extension Strengthening;15 reps;Seated;Bar weights/barbell  Bar Weights/Barbell (Shoulder Extension) 2 lbs  Shoulder Horizontal ABduction Strengthening;15 reps;Seated;Bar weights/barbell  Bar Weights/Barbell (Shoulder Horizontal Abduction) 2 lbs  Shoulder Horizontal ADduction Strengthening;15 reps;Seated;Bar weights/barbell  Bar Weights/Barbell (Shoulder Horizontal Adduction) 2 lbs  Elbow Flexion Strengthening;15 reps;Seated;Bar weights/barbell (weighted ball)  Bar Weights/Barbell (Elbow Flexion) 3 lbs  Elbow Extension Strengthening;15 reps;Seated;Bar weights/barbell (weighted ball)  Bar Weights/Barbell (Elbow Extension) 3 lbs  Other Exercises  Other Exercises 2# weighted  bar; 15X; circles forward/backwards  Other Exercises Shoulder protraction; 15X; 3# weighted ball and 2# weighted bar     See FIM for current functional status  Therapy/Group: Individual Therapy  Limmie Patricia, OTR/L  10/21/2012, 11:36 AM

## 2012-10-21 NOTE — Progress Notes (Signed)
Social Work  Social Work Assessment and Plan  Patient Details  Name: Joe Snow MRN: [id number] Date of Birth: [birth year]  Today's Date: 10/21/2012  Problem List:  Patient Active Problem List  Diagnosis  . Embolic stroke involving left vertebral artery  . Chronic a-fib  . Diabetes  . Essential hypertension, benign  . Shortness of breath  . Dysphagia, pharyngeal-neurogenic  . Esophageal candidiasis  . Hiccups  . COPD exacerbation  . OSA on CPAP  . CVA (cerebral infarction)  . Dysphagia, unspecified  . Acute respiratory failure  . Embolic cerebrovascular disease   Past Medical History:  Past Medical History  Diagnosis Date  . Hypertension associated with diabetes   . Diabetes   . Atrial fibrillation   . Embolic stroke involving left vertebral artery 10/02/2012  . Dysphagia, pharyngeal-neurogenic 10/05/2012  . Dysrhythmia     Conjenatle A-Fib   Past Surgical History:  Past Surgical History  Procedure Laterality Date  . Prostate surgery N/A 1995    estimate  . Tonsillectomy Bilateral 1930s  . Esophagogastroduodenoscopy N/A 10/05/2012    Procedure: ESOPHAGOGASTRODUODENOSCOPY (EGD);  Surgeon: Iva Boop, MD;  [facility] ENDOSCOPY;  Service: Endoscopy;  Laterality: N/A;  . Peg placement N/A 10/15/2012    Procedure: PERCUTANEOUS ENDOSCOPIC GASTROSTOMY (PEG) PLACEMENT;  Surgeon: Beverley Fiedler, MD;  Location: [facility] ENDOSCOPY;  Service: Gastroenterology;  Laterality: N/A;   Social History:  reports that he quit smoking about 34 years ago. His smoking use included Cigarettes. He smoked 0.00 packs per day. He has never used smokeless tobacco. He reports that he drinks about 1.0 ounces of alcohol per week. He reports that he does not use illicit drugs.  Family / Support Systems Marital Status: Married How Long?: 47 yrs Patient Roles: Parent;Spouse Spouse/Significant Other: Joanne Gavel @ (H) [phone] or [phone] Children: son, Jake Shark @ [phone] or [phone] Other  Supports: two daughters are also very involved Anticipated Caregiver: Wife and children to assist Ability/Limitations of Caregiver: WIfe is in good health and willing to do what she can for pt Caregiver Availability: 24/7 Family Dynamics: close knit family with three children from pt's first marriage - good communication among them and all very supportive.  Social History Preferred language: English Religion:  Cultural Background: NA Education: college Read: Yes Write: Yes Employment Status: Retired Date Retired/Disabled/Unemployed: Still Systems analyst Issues: No issues Guardian/Conservator: None-according to MD pt is capable of making his own decisions.  Wife very much involved also   Abuse/Neglect Physical Abuse: Denies Verbal Abuse: Denies Sexual Abuse: Denies Exploitation of patient/patient's resources: Denies Self-Neglect: Denies  Emotional Status Pt's affect, behavior adn adjustment status: Pt is motivated to improve and will do what he needs to do.  Wife is here and will take shifts along with his children to be here for him.  He has always been independent, so not used to relying upon others.  Pt and family deny ay significant emotional distress at this point, however, will monitor throughout stay. Recent Psychosocial Issues: other medical issues but he managed Pyschiatric History: Was taking zoloft PTA wife reports MD placed him on it, thought it would help him.  Both feel doing well with the circumstances.  May help to have Neuro-psych eval while here.  Too tired to do depression screen, will try at a later time. Substance Abuse History: NA  Patient / Family Perceptions, Expectations & Goals Pt/Family understanding of illness & functional limitations: Pt and wife are just hopeful that he will  reach at least a minimal level for assitance needs Premorbid pt/family roles/activities: Husband, father, grandfather, retiree, Research scientist (medical), Mining engineer,  etc Anticipated changes in roles/activities/participation: Wife to provide any caregiver assist needs Pt/family expectations/goals: Pt states: " I want to be as independent as possibel before leaving here.  I am a hard worker."    [employer]: None Premorbid Home Care/DME Agencies: None Transportation available at discharge: [employer] referrals recommended: Support group (specify);Neuropsychology ([facility] Support group)  Discharge Planning Living Arrangements: Spouse/significant other Support Systems: Spouse/significant other;Children;Other relatives;Friends/neighbors;Church/faith community Type of Residence: Private residence Insurance Resources: Administrator (specify) Building services engineer secondary) Financial Resources: Tree surgeon;Other (Comment) Financial Screen Referred: No Living Expenses: Own Money Management: Patient;Spouse Do you have any problems obtaining your medications?: No Home Management: Wife Patient/Family Preliminary Plans: Return home with wife who can be the main caregiver, children also to assist.  Wife and children have been taking turns staying with pt while hosptialized.  They feel it helps him to have a family member here, so they plan to be here alot. Social Work Anticipated Follow Up Needs: HH/OP;Support Group Expected length of stay: 2 weeks  Clinical Impression Pleasant gentleman here after stroke and recent peg placement.  Family at bedside and very supportive - able to provide anticipated assist needed up d/c.  No significant emotional distress noted - will monitor.  Jozelynn Danielson 10/21/2012, 2:45 PM

## 2012-10-21 NOTE — Progress Notes (Signed)
Subjective/Complaints: Occasional cough last night. Was able to sleep. constipated A 12 point review of systems has been performed and if not noted above is otherwise negative.      Objective:    Blood pressure 156/96, pulse 85, temperature 98.2 F (36.8 C), temperature source Oral, resp. rate 16, height 5' 11.5" (1.816 m), weight 83 kg (182 lb 15.7 oz), SpO2 95.00%. No results found. No results found for this basename: WBC, HGB, HCT, PLT,  in the last 72 hours No results found for this basename: NA, K, CL, CO, GLUCOSE, BUN, CREATININE, CALCIUM,  in the last 72 hours CBG (last 3)   Recent Labs  10/21/12 0034 10/21/12 0422 10/21/12 0716  GLUCAP 211* 167* 190*    Wt Readings from Last 3 Encounters:  10/21/12 83 kg (182 lb 15.7 oz)  10/17/12 84.324 kg (185 lb 14.4 oz)  10/17/12 84.324 kg (185 lb 14.4 oz)    Physical Exam:  Vital Signs: Constitutional: He appears well-developed.  HENT: some thrush present on tongue. Voice sounds a little wetter today Head: Normocephalic.  Eyes: EOM are normal.  Neck: Normal range of motion. Neck supple. No thyromegaly present.  Cardiovascular:  Cardiac rate controlled  Pulmonary/Chest: Breath sounds normal. No respiratory distress. No wheezes, rales, rhonchi Abdominal: Soft. Bowel sounds are normal. He exhibits no distension. Gastrostomy tube in place with minimal drainage. Musculoskeletal: He exhibits no edema.  Neurological: He is alert.  Patient is alert. He was able to name person.   impulsive still. Speech remains dysarthric but intelligible. Follows all commands. Right-sided limb ataxia in right arm and leg. Fair sitting balance while in bed. fair reasonable insight and awareness. No gross sensory deficits. Handling secretions fairly well. Strength nearly 5/5 in all limbs.    Assessment/Plan: 1. 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 Physiatrist is providing close team supervision and 24 hour management of active medical problems listed below. Physiatrist and rehab team continue to assess barriers to discharge/monitor patient progress toward functional and medical goals. FIM: FIM - Bathing Bathing Steps Patient Completed: Chest;Right Arm;Left Arm;Abdomen;Front perineal area;Buttocks;Right upper leg;Left upper leg Bathing: 4: Min-Patient completes 8-9 14f 10 parts or 75+ percent  FIM - Upper Body Dressing/Undressing Upper body dressing/undressing steps patient completed: Thread/unthread left sleeve of pullover shirt/dress;Thread/unthread right sleeve of pullover shirt/dresss;Pull shirt over trunk;Put head through opening of pull over shirt/dress Upper body dressing/undressing: 4: Steadying assist FIM - Lower Body Dressing/Undressing Lower body dressing/undressing steps patient completed: Thread/unthread right underwear leg;Thread/unthread left underwear leg;Thread/unthread right pants leg;Thread/unthread left pants leg Lower body dressing/undressing: 2: Max-Patient completed 25-49% of tasks  FIM - Toileting Toileting: 1: Total-Patient completed zero steps, helper did all 3  FIM - Toilet Transfers Toilet Transfers: 3-To toilet/BSC: Mod A (lift or lower assist);3-From toilet/BSC: Mod A (lift or lower assist)  FIM - Bed/Chair Transfer Bed/Chair Transfer: 3: Chair or W/C > Bed: Mod A (lift or lower assist);3: Bed > Chair or W/C: Mod A (lift or lower assist)  FIM - Locomotion: Wheelchair Distance: 35' Locomotion: Wheelchair: 1: Travels less than 50 ft with minimal assistance (Pt.>75%) FIM - Locomotion: Ambulation Ambulation/Gait Assistance: 1: +2 Total assist Locomotion: Ambulation: 1: Two helpers  Comprehension Comprehension Mode: Auditory Comprehension: 4-Understands basic 75 - 89% of the time/requires cueing 10 - 24% of the  time  Expression Expression Mode: Verbal Expression: 3-Expresses basic 50 - 74% of  the time/requires cueing 25 - 50% of the time. Needs to repeat parts of sentences.  Social Interaction Social Interaction: 2-Interacts appropriately 25 - 49% of time - Needs frequent redirection.  Problem Solving Problem Solving Mode: Not assessed Problem Solving: 1-Solves basic less than 25% of the time - needs direction nearly all the time or does not effectively solve problems and may need a restraint for safety  Memory Memory Mode: Not assessed Memory: 2-Recognizes or recalls 25 - 49% of the time/requires cueing 51 - 75% of the time Medical Problem List and Plan:  1. 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 DVT Prophylaxis/Anticoagulation: Xarelto 15 mg daily.  3. Mood: Ativan 0.5 mg every 6 hours as needed  4. Neuropsych: This patient is capable of making decisions on his/her own behalf.  5. Dysphagia. Gastrostomy tube placed 10/15/2012. Followup speech therapy monitor for any aspiration. Speech and secretion control improved. Still NPO 6. Hypothyroidism. Synthroid  7. Atrial fibrillation. Continue Xarelto as directed. Cardiac rate fairly well controlled  8. Esophageal candidiasis. Diflucan completed--resumed. Continue for a few more days 9. Diabetes mellitus with peripheral neuropathy. Continue sliding scale covg. Will add lantus 5u q12 10. HTN: bp still elevated but improved. Increase lisinopril to 15mg  bid    LOS (Days) 4 A FACE TO FACE EVALUATION WAS PERFORMED  Rishab Stoudt T 10/21/2012 8:17 AM

## 2012-10-21 NOTE — Progress Notes (Signed)
Physical Therapy Note  Patient Details  Name: Joe Snow MRN: [id number] Date of Birth: [birth year] Today's Date: 10/21/2012  Time: 1115-1145 30 minutes  No c/o pain.  Pt found asleep at beginning of session, required increased time to arouse.  Nu step for B UE/LE strength and coordination 2 x 5 minutes level 4.  W/c mobility focus on attention and equal use of B UEs for steering, pt continues to require min A at times for steering but able to attend with min cues.  Individual therapy   Lutie Pickler 10/21/2012, 2:06 PM

## 2012-10-21 NOTE — Progress Notes (Addendum)
INITIAL NUTRITION ASSESSMENT  DOCUMENTATION CODES Per approved criteria  -Not Applicable   INTERVENTION: 1. To better meet nutrition needs and accommodate therapy schedule, will change TF rate. Increase Glucerna 1.2 to 80 ml/hr x 20 hours daily. (Anticipate TF to be held 4 hours daily for participation in therapy.) This new goal rate will provide: 1920 kcal, 96 grams protein, 1288 ml free water. Discussed changes of TF plan RN. 2. Continue free water flushes of 200 ml q 4 hours - this will provide an additional 1200 ml water daily 3. RD to continue to follow nutrition care plan  NUTRITION DIAGNOSIS: Inadequate oral intake related to inability to eat as evidenced by NPO status.   Goal: Intake to meet >90% of estimated nutrition needs.  Monitor:  weight trends, lab trends, I/O's, TF tolerance  Reason for Assessment: Malnutrition Screening Tool + New TF  79 y.o. male  Admitting Dx: Embolic cerebrovascular disease  ASSESSMENT: Patient admitted 3/3 with R-sided weakness and facial droop. MRI of the brain 3/4 showed small acute non-hemorrhagic infarcts inferior aspect of the R cerebellum and R posterior lateral aspect of the medulla. Patient noted on 3/10 increased confusion and swallowing difficulties. F/u ST with findings of severe oral pharyngeal dysphagia and dysarthria. Swallow study completed with NPO rec's. NGT placed, pt received Jevity 1.2 at 65 ml/hr. There was findings of esophageal candidiasis. Readmitted to acute hospital 3/14 for trach and PEG.  Nutrition hx obtained from wife by RD on acute admission, wife states that because of pt's GIB in December, pt has been off coumadin and trying other medications. Wife reports pt has had multiple adverse reactions to these medications and feels that since this time, pt's appetite has not recovered. According to pt, he doesn't want to eat because "nothing tastes good." Wife states pt drinks Glucerna at home.  Continues on Glucerna 1.2 at  70 ml/hr. This provides: 2016 kcal, 100 grams protein, 1352 ml free water.  Height: Ht Readings from Last 1 Encounters:  10/17/12 5' 11.5" (1.816 m)    Weight: Wt Readings from Last 1 Encounters:  10/21/12 182 lb 15.7 oz (83 kg)    Ideal Body Weight: 175 lb  % Ideal Body Weight: 113%  Wt Readings from Last 10 Encounters:  10/21/12 182 lb 15.7 oz (83 kg)  10/17/12 185 lb 14.4 oz (84.324 kg)  10/17/12 185 lb 14.4 oz (84.324 kg)  10/11/12 204 lb 2.3 oz (92.6 kg)  09/30/12 197 lb 8.5 oz (89.6 kg)  09/30/12 197 lb 8.5 oz (89.6 kg)    Usual Body Weight: 200 lb  % Usual Body Weight: 99%  BMI:  Body mass index is 25.17 kg/(m^2). Overweight.  Estimated Nutritional Needs: Kcal: 1900 - 2100 Protein: 90 - 105 grams Fluid: 1.9 - 2.1 liters  Skin: intact  Diet Order: NPO  EDUCATION NEEDS: -No education needs identified at this time  No intake or output data in the 24 hours ending 10/21/12 1031  Last BM: 3/19  Labs:   Recent Labs Lab 10/15/12 0315 10/17/12 0515 10/18/12 0635  NA 141 139 139  K 3.0* 4.8 4.1  CL 102 103 101  CO2 28 29 29   BUN 11 14 15   CREATININE 1.35 1.41* 1.39*  CALCIUM 9.3 9.5 9.3  MG  --  1.7  --   GLUCOSE 174* 183* 165*    CBG (last 3)   Recent Labs  10/21/12 0034 10/21/12 0422 10/21/12 0716  GLUCAP 211* 167* 190*    Scheduled Meds: .  free water  200 mL Per Tube Q4H  . insulin aspart  0-9 Units Subcutaneous Q4H  . insulin glargine  5 Units Subcutaneous Q12H  . levothyroxine  88 mcg Per Tube QAC breakfast  . lisinopril  15 mg Oral BID  . rivaroxaban  15 mg Oral Q supper    Continuous Infusions: . feeding supplement (GLUCERNA 1.2 CAL) 1,000 mL (10/21/12 0334)    Past Medical History  Diagnosis Date  . Hypertension associated with diabetes   . Diabetes   . Atrial fibrillation   . Embolic stroke involving left vertebral artery 10/02/2012  . Dysphagia, pharyngeal-neurogenic 10/05/2012  . Dysrhythmia     Conjenatle A-Fib     Past Surgical History  Procedure Laterality Date  . Prostate surgery N/A 1995    estimate  . Tonsillectomy Bilateral 1930s  . Esophagogastroduodenoscopy N/A 10/05/2012    Procedure: ESOPHAGOGASTRODUODENOSCOPY (EGD);  Surgeon: Iva Boop, MD;  Location[facility] ENDOSCOPY;  Service: Endoscopy;  Laterality: N/A;  . Peg placement N/A 10/15/2012    Procedure: PERCUTANEOUS ENDOSCOPIC GASTROSTOMY (PEG) PLACEMENT;  Surgeon: Beverley Fiedler, MD;  Location: [facility] ENDOSCOPY;  Service: Gastroenterology;  Laterality: N/A;    Jarold Motto MS, RD, LDN Pager: [phone] After-hours pager: [phone]

## 2012-10-21 NOTE — Progress Notes (Signed)
Family requests all side rails remain up

## 2012-10-21 NOTE — Progress Notes (Signed)
Physical Therapy Note  Patient Details  Name: Joe Snow MRN: [id number] Date of Birth: 09/22/1923 Today's Date: 10/21/2012  Time: 800-900 60 minutes  No c/o pain.  W/c mobility in controlled environment 100'x2 with min A for steering, mod cuing for attention to obstacles.  Gait training controlled environment with RW, mod A for steering RW, wt shifts, posture and balance.  Pt tends to lose balance A/P, delayed/decreased balance reactions.  Pt continues with scissoring gait, able to follow verbal cues to widen BOS for 2-3 steps, then returns to scissoring.  Gait with board between feet to widen BOS.  Pt fatigues more quickly but able to perform with mod A, little carry over when board removed, pt returns to narrow BOS.  Standing NMR focusing on A/P wt shifts with sit to stands and bending/reaching activities.  Pt requires mod manual facilitation but improved with repetition throughout session.  Individual therapy   Ciani Rutten 10/21/2012, 8:56 AM

## 2012-10-22 ENCOUNTER — Inpatient Hospital Stay (HOSPITAL_COMMUNITY): Payer: Medicare Other | Admitting: Physical Therapy

## 2012-10-22 ENCOUNTER — Encounter (HOSPITAL_COMMUNITY): Payer: PRIVATE HEALTH INSURANCE | Admitting: Occupational Therapy

## 2012-10-22 ENCOUNTER — Inpatient Hospital Stay (HOSPITAL_COMMUNITY): Payer: PRIVATE HEALTH INSURANCE | Admitting: Occupational Therapy

## 2012-10-22 ENCOUNTER — Inpatient Hospital Stay (HOSPITAL_COMMUNITY): Payer: Medicare Other | Admitting: Speech Pathology

## 2012-10-22 LAB — GLUCOSE, CAPILLARY
Glucose-Capillary: 159 mg/dL — ABNORMAL HIGH (ref 70–99)
Glucose-Capillary: 170 mg/dL — ABNORMAL HIGH (ref 70–99)
Glucose-Capillary: 165 mg/dL — ABNORMAL HIGH (ref 70–99)
Glucose-Capillary: 155 mg/dL — ABNORMAL HIGH (ref 70–99)
Glucose-Capillary: 180 mg/dL — ABNORMAL HIGH (ref 70–99)

## 2012-10-22 MED ORDER — LISINOPRIL 20 MG PO TABS
20.0000 mg | ORAL_TABLET | Freq: Two times a day (BID) | ORAL | Status: DC
  Administered 2012-10-22 – 2012-10-24 (×6): 20 mg via ORAL
  Filled 2012-10-22 (×9): qty 1

## 2012-10-22 MED ORDER — QUETIAPINE FUMARATE 25 MG PO TABS
25.0000 mg | ORAL_TABLET | Freq: Every day | ORAL | Status: DC
Start: 2012-10-22 — End: 2012-10-23
  Administered 2012-10-22: 25 mg via ORAL
  Filled 2012-10-22 (×2): qty 1

## 2012-10-22 NOTE — Progress Notes (Signed)
Patient was noted on the floor at 0155, wife at bedside. No injuries are noted. Pt A/O x1. Will continue monitoring.

## 2012-10-22 NOTE — Progress Notes (Signed)
Occupational Therapy Session Note  Patient Details  Name: Joe Snow MRN: [id number] Date of Birth: 25-Jun-1924  Today's Date: 10/22/2012 Time: 9562-1308 Time Calculation (min): 30 min   Skilled Therapeutic Interventions/Progress Updates:    1:1 Focus on transfers with control (stand pivot), sit to stands with control with total A for recall of proper hand placement, LE strengthening and balance with standing with back to wall performing squats and wall bumps, weight shifts, self correcting balance deficits with mod A with extra time with heavy lean to the right, functional ambulation with RW with mod A with A to steer RW, turning with RW with extra time and max A.   Therapy Documentation Precautions:  Precautions Precautions: Fall Precaution Comments: Abdominal Binder, PEG tube Restrictions Weight Bearing Restrictions: No Pain:  no c/o pain See FIM for current functional status  Therapy/Group: Individual Therapy  Joe Snow [facility] 10/22/2012, 3:29 PM

## 2012-10-22 NOTE — Progress Notes (Signed)
Physical Therapy Note  Patient Details  Name: Joe Snow MRN: [id number] Date of Birth: 1924/05/29 Today's Date: 10/22/2012  Pt missed 60 min of therapy after 20 minutes of trying to arouse pt to participate in therapy. Vital WFL, RN aware. Pt did not sleep last night and is too fatigue to safely participate in therapy at this time.    Roney Mans [facility] 10/22/2012, 10:11 AM

## 2012-10-22 NOTE — Progress Notes (Signed)
SLP Cancellation Note  Patient Details Name: Joe Snow MRN: [id number] DOB: September 03, 1923   Cancelled treatment:       Pt missed 45 min of skilled SLP intervention despite multiple attempts at arousal. RN aware. Pt did not sleep last night and is too fatigued to safely participate in therapy at this time.     [name] 10/22/2012, 11:26 AM

## 2012-10-22 NOTE — Progress Notes (Signed)
MD on call notified about fall incidence, he recommends that the bed exit alarm has to be turned on at all times

## 2012-10-22 NOTE — Progress Notes (Signed)
Subjective/Complaints: Very restless. Didn't sleep last night. Needs to urgently empty today A 12 point review of systems has been performed and if not noted above is otherwise negative.      Objective:    Blood pressure 155/102, pulse 81, temperature 97.8 F (36.6 C), temperature source Oral, resp. rate 18, height 5' 11.5" (1.816 m), weight 81.5 kg (179 lb 10.8 oz), SpO2 95.00%. No results found. No results found for this basename: WBC, HGB, HCT, PLT,  in the last 72 hours No results found for this basename: NA, K, CL, CO, GLUCOSE, BUN, CREATININE, CALCIUM,  in the last 72 hours CBG (last 3)   Recent Labs  10/21/12 2344 10/22/12 0405 10/22/12 0732  GLUCAP 175* 159* 170*    Wt Readings from Last 3 Encounters:  10/22/12 81.5 kg (179 lb 10.8 oz)  10/17/12 84.324 kg (185 lb 14.4 oz)  10/17/12 84.324 kg (185 lb 14.4 oz)    Physical Exam:  Vital Signs: Constitutional: He appears well-developed.  HENT: some thrush present on tongue. Voice sounds a little wetter today Head: Normocephalic.  Eyes: EOM are normal.  Neck: Normal range of motion. Neck supple. No thyromegaly present.  Cardiovascular:  Cardiac rate controlled  Pulmonary/Chest: Breath sounds normal. No respiratory distress. No wheezes, rales, rhonchi Abdominal: Soft. Bowel sounds are normal. He exhibits no distension. Gastrostomy tube in place with minimal drainage. Musculoskeletal: He exhibits no edema.  Neurological: He is alert.  Patient is alert. He was able to name person.   impulsive still. Speech remains dysarthric but intelligible. Follows all commands. Right-sided limb ataxia in right arm and leg. Fair sitting balance while in bed. fair reasonable insight and awareness. No gross sensory deficits. Handling secretions fairly well. Strength nearly 5/5 in all limbs.    Assessment/Plan: 1. 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 Physiatrist is providing close team supervision and 24 hour management of active medical problems listed below. Physiatrist and rehab team continue to assess barriers to discharge/monitor patient progress toward functional and medical goals. FIM: FIM - Bathing Bathing Steps Patient Completed: Chest;Right Arm;Left Arm;Abdomen;Front perineal area;Buttocks Bathing: 3: Mod-Patient completes 5-7 76f 10 parts or 50-74%  FIM - Upper Body Dressing/Undressing Upper body dressing/undressing steps patient completed: Thread/unthread right sleeve of pullover shirt/dresss;Pull shirt over trunk;Put head through opening of pull over shirt/dress;Thread/unthread left sleeve of pullover shirt/dress Upper body dressing/undressing: 5: Supervision: Safety issues/verbal cues FIM - Lower Body Dressing/Undressing Lower body dressing/undressing steps patient completed: Thread/unthread left underwear leg;Thread/unthread right underwear leg;Pull underwear up/down;Thread/unthread right pants leg;Thread/unthread left pants leg;Pull pants up/down;Don/Doff right shoe;Don/Doff left shoe Lower body dressing/undressing: 4: Steadying Assist  FIM - Toileting Toileting: 0: Activity did not occur  FIM - Archivist Transfers: 0-Activity did not occur  FIM - Games developer Transfer: 4: Chair or W/C > Bed: Min A (steadying Pt. > 75%);4: Bed > Chair or W/C: Min A (steadying Pt. > 75%)  FIM - Locomotion: Wheelchair Distance: 35' Locomotion: Wheelchair: 2: Travels 50 - 149 ft with minimal assistance (Pt.>75%) FIM - Locomotion: Ambulation Ambulation/Gait Assistance: 1: +2 Total assist Locomotion: Ambulation: 1: Travels less than 50 ft with moderate assistance (Pt: 50 - 74%)  Comprehension Comprehension Mode: Auditory Comprehension: 2-Understands basic 25 - 49% of the time/requires cueing 51 - 75% of the  time  Expression Expression Mode: Verbal Expression: 2-Expresses basic 25 - 49% of the  time/requires cueing 50 - 75% of the time. Uses single words/gestures.  Social Interaction Social Interaction: 2-Interacts appropriately 25 - 49% of time - Needs frequent redirection.  Problem Solving Problem Solving Mode: Not assessed Problem Solving: 2-Solves basic 25 - 49% of the time - needs direction more than half the time to initiate, plan or complete simple activities  Memory Memory Mode: Not assessed Memory: 2-Recognizes or recalls 25 - 49% of the time/requires cueing 51 - 75% of the time Medical Problem List and Plan:  1. 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 DVT Prophylaxis/Anticoagulation: Xarelto 15 mg daily.  3. Mood: Ativan 0.5 mg every 6 hours as needed   -will add scheduled seroquel hs for restless, agitation  -check for metabolic/infectious causes as well, urine may be playing a part 4. Neuropsych: This patient is capable of making decisions on his/her own behalf.  5. Dysphagia. Gastrostomy tube placed 10/15/2012. Followup speech therapy monitor for any aspiration. Speech and secretion control improved. Still NPO 6. Hypothyroidism. Synthroid  7. Atrial fibrillation. Continue Xarelto as directed. Cardiac rate fairly well controlled  8. Esophageal candidiasis. Diflucan-dc 9. Diabetes mellitus with peripheral neuropathy. Continue sliding scale covg.  lantus 5u q12 10. HTN: bp still elevated but improved. Increase lisinopril to 20mg  bid    LOS (Days) 5 A FACE TO FACE EVALUATION WAS PERFORMED  SWARTZ,ZACHARY T 10/22/2012 7:45 AM

## 2012-10-22 NOTE — Progress Notes (Signed)
Physical Therapy Note  Patient Details  Name: Joe Snow MRN: [id number] Date of Birth: [birth year] Today's Date: 10/22/2012  Time: 800-856 56 minutes  No c/o pain.  W/c mobility in controlled environment improved steering and control, requiring only supervision in clear spaces, 1 x needing min A for obstacle negotiation.  Gait training with RW 35', 20' with min A, cues for wider BOS, manual facilitation for wt shift and RW control.  Pt tends to flex fwd when fatigued requiring tactile cues for upright posture.  NMR in closed chain position with alternating UE and LE lifts with mod A for balance and coordination, improved with repetition throughout session.  Sit to stands and standing balance training with UEs holding ball to promote core and hip stability with mod A for wt shifts.  Pt fatigued easily with activities this session requiring more frequent rests.  Pt able to state "I know I am losing my balance, I just can't fix it"  Individual therapy   DONAWERTH,KAREN 10/22/2012, 8:55 AM

## 2012-10-23 ENCOUNTER — Inpatient Hospital Stay (HOSPITAL_COMMUNITY): Payer: Medicare Other | Admitting: Speech Pathology

## 2012-10-23 ENCOUNTER — Inpatient Hospital Stay (HOSPITAL_COMMUNITY): Payer: Medicare Other | Admitting: Physical Therapy

## 2012-10-23 ENCOUNTER — Inpatient Hospital Stay (HOSPITAL_COMMUNITY): Payer: Medicare Other | Admitting: Occupational Therapy

## 2012-10-23 LAB — CBC
Hemoglobin: 13.2 g/dL (ref 13.0–17.0)
MCH: 26.9 pg (ref 26.0–34.0)
Platelets: 172 10*3/uL (ref 150–400)
RBC: 4.91 MIL/uL (ref 4.22–5.81)

## 2012-10-23 LAB — GLUCOSE, CAPILLARY
Glucose-Capillary: 189 mg/dL — ABNORMAL HIGH (ref 70–99)
Glucose-Capillary: 184 mg/dL — ABNORMAL HIGH (ref 70–99)

## 2012-10-23 LAB — BASIC METABOLIC PANEL
CO2: 31 mEq/L (ref 19–32)
Calcium: 9.3 mg/dL (ref 8.4–10.5)
GFR calc non Af Amer: 44 mL/min — ABNORMAL LOW (ref >90)
Glucose, Bld: 176 mg/dL — ABNORMAL HIGH (ref 70–99)
Potassium: 3.9 mEq/L (ref 3.5–5.1)
Sodium: 139 mEq/L (ref 135–145)

## 2012-10-23 LAB — URINE MICROSCOPIC-ADD ON

## 2012-10-23 LAB — URINALYSIS, ROUTINE W REFLEX MICROSCOPIC
Bilirubin Urine: NEGATIVE
Glucose, UA: NEGATIVE mg/dL
Ketones, ur: NEGATIVE mg/dL
Specific Gravity, Urine: 1.006 (ref 1.005–1.030)
pH: 7 (ref 5.0–8.0)

## 2012-10-23 LAB — URINE CULTURE

## 2012-10-23 MED ORDER — FREE WATER
250.0000 mL | Status: DC
Start: 2012-10-23 — End: 2012-10-25
  Administered 2012-10-23 – 2012-10-24 (×9): 250 mL

## 2012-10-23 MED ORDER — WHITE PETROLATUM GEL
Status: AC
  Administered 2012-10-23: 0.2
  Filled 2012-10-23: qty 5

## 2012-10-23 MED ORDER — QUETIAPINE FUMARATE 50 MG PO TABS
50.0000 mg | ORAL_TABLET | Freq: Every day | ORAL | Status: DC
  Administered 2012-10-23 – 2012-10-24 (×2): 50 mg via ORAL
  Filled 2012-10-23 (×3): qty 1

## 2012-10-23 NOTE — Progress Notes (Signed)
Physical Therapy Note  Patient Details  Name: Joe Snow MRN: [id number] Date of Birth: September 11, 1923 Today's Date: 10/23/2012  Time: 1300-1330 30 minutes  No c/o pain.  Hip and core strengthening in standing with B UE support with side stepping and marching with emphasis on wide BOS, hip abductor and extensor contraction and control.  Improved core stability noted.  Closed chain position work with focus on alternating UE lifts while keeping trunk controlled, pt with increased difficulty when lifting L UE, requires mod-max manual facilitation for stability.  Gait training with RW 25', 50' with min-mod A for RW control, wt shifts , verbal cues to increase BOS.  Individual therapy   DONAWERTH,KAREN 10/23/2012, 2:20 PM

## 2012-10-23 NOTE — Patient Care Conference (Signed)
Inpatient RehabilitationTeam Conference and Plan of Care Update Date: 10/22/2012   Time: 2:50 PM    Patient Name: Joe Snow      Medical Record Number: [id number]  Date of Birth: 07-Feb-1924 Sex: Male         Room/Bed: 4028/4028-01 Payor Info: Payor: MEDICARE  Plan: MEDICARE PART A AND B  Product Type: *No Product type*     Admitting Diagnosis: RT CVA  Admit Date/Time:  10/17/2012  6:38 PM Admission Comments: No comment available   Primary Diagnosis:  Embolic cerebrovascular disease Principal Problem: Embolic cerebrovascular disease  Patient Active Problem List   Diagnosis Date Noted  . Embolic cerebrovascular disease 10/18/2012  . CVA (cerebral infarction) 10/10/2012  . Dysphagia, unspecified 10/10/2012  . Acute respiratory failure 10/10/2012  . Hiccups 10/09/2012  . COPD exacerbation 10/09/2012  . OSA on CPAP 10/09/2012  . Dysphagia, pharyngeal-neurogenic 10/05/2012  . Esophageal candidiasis 10/05/2012  . Embolic stroke involving left vertebral artery 10/02/2012  . Chronic a-fib 10/02/2012  . Diabetes 10/02/2012  . Essential hypertension, benign 10/02/2012  . Shortness of breath 10/02/2012    Expected Discharge Date: Expected Discharge Date: 11/13/12  Team Members Present: Physician leading conference: Dr. Faith Rogue Social Worker Present: Amada Jupiter, LCSW Nurse Present: Carlean Purl, RN PT Present: Reggy Eye, PT OT Present: Roney Mans, OT;Ardis Rowan, COTA SLP Present: Feliberto Gottron, SLP Other (Discipline and Name): Tora Duck, PPS Coordinator     Current Status/Progress Goal Weekly Team Focus  Medical   right cerebellar, brainstem infarct. poor sleep over last few nights. more confusion and problems with arousal as well  increased sleep, improved sleep/wake cycle  aspiration precautions, look for id or metabolic causes   Bowel/Bladder   Pt is continent of bowel and bladder  Pt will remain continent  Continue to monitor   Swallow/Nutrition/  Hydration             ADL's   Pt is currently Supervision for UB dressing; Min A for grooming/LB dressing; Mod A for bathing; Total A for toileting. Functional transfer: Max A with RW.  Supervision-Min Assist  Standing balance, ADL performance, functional transfers, family education   Mobility   min A  supervision  awareness, attention, balance   Communication             Safety/Cognition/ Behavioral Observations  NPO and Max A for pharyngeal strengthening exercises  Min A  NMES, pharyngeal strengthening exercises, increased management of secretions   Pain   n/a         Skin   Skin is intact  Skin will remain intact; free from breakdown  Continue to monitor Qshift and prn    Rehab Goals Patient on target to meet rehab goals: Yes *See Interdisciplinary Assessment and Plan and progress notes for long and short-term goals  Barriers to Discharge: altered mental status and sleep difficulties    Possible Resolutions to Barriers:  rx above, re-establish sleep    Discharge Planning/Teaching Needs:  home with wife and other family providing 24/7 assistance      Team Discussion:  Still very cognitively impaired which may be mostly due to poor sleep.  Not recalling need to clear throat.  Wife not recognizing extent of cognintive impairment.  MD to rule out medical cause of decrease in cognition.  Need to increase alertness.  Concern that family may not be able to meet care needs if little improvement made.  Revisions to Treatment Plan:  None at this time.   Continued  Need for Acute Rehabilitation Level of Care: The patient requires daily medical management by a physician with specialized training in physical medicine and rehabilitation for the following conditions: Daily direction of a multidisciplinary physical rehabilitation program to ensure safe treatment while eliciting the highest outcome that is of practical value to the patient.: Yes Daily medical management of patient stability  for increased activity during participation in an intensive rehabilitation regime.: Yes Daily analysis of laboratory values and/or radiology reports with any subsequent need for medication adjustment of medical intervention for : Neurological problems;Pulmonary problems (pulmonary, severe dysphagia)  Ammara Raj 10/23/2012, 1:14 PM

## 2012-10-23 NOTE — Progress Notes (Signed)
Social Work Patient ID: Joe Snow, male   DOB: [birth year], 79 y.o.   MRN: [id number]  Met with wife this afternoon to review team conference.  Pt more alert and attempts to engage in discussion, however, comments often not making sense.  Have explained to wife that team has targeted a d/c date of 4/16 and hopeful we might reach minimal assistance goals.  Pt's poor cognition, alertness and safety awareness are primary concern.  Wife feels this is mostly due to poor sleep.  Explained that MD is looking at several possible causes but that it remains a concern in terms of care he may need at home.   Will continue to follow and assist with d/c planning, support and education.  Saraann Enneking, LCSW

## 2012-10-23 NOTE — Progress Notes (Signed)
Occupational Therapy Session Note  Patient Details  Name: Joe Snow MRN: [id number] Date of Birth: [birth year]  Today's Date: 10/23/2012 Time: 1100-1200 Time Calculation (min): 60 min   Skilled Therapeutic Interventions/Progress Updates:    1:1 self care retraining at shower level. Pt in bed when arrived and reported he needed to go to the bathroom. Performed functional ambulation with mod A with RW with mod cuing for remaining inside of walker. Pt becoming incontinent of urine on the way to the bathroom. After pt had a BM pt wanted to argue about being alone in the bathroom even though therapist had been in the BR the whole time. As part of hygiene- pt put both hands into toilet water to clean/ wash his bottom. When try to redirect pt that it is dirty water; pt reported "do you want me to splash you with it" Performed bathing at shower level with min A for balance. Pt dressed in chair with min to mod A to maintain sitting balance; pt unaware of himself falling forwards out of chair. Pt with 2 LOB requiring max A to correct. Pt's wife present for session.   Therapy Documentation Precautions:  Precautions Precautions: Fall Precaution Comments: Abdominal Binder, PEG tube Restrictions Weight Bearing Restrictions: No Pain:  no c/o pain  See FIM for current functional status  Therapy/Group: Individual Therapy  Roney Mans [facility] 10/23/2012, 3:28 PM

## 2012-10-23 NOTE — Progress Notes (Signed)
Physical Therapy Note  Patient Details  Name: Harlie Ragle MRN: [id number] Date of Birth: 1923/11/29 Today's Date: 10/23/2012  Time: 755-851 56 minutes  No c/o pain.  Pt found asleep in bed on PT arrival, easily aroused.  W/c mobility in controlled environment with mod cuing for attention to task and parts management, pt able to propel with supervision.  Gait training with RW in controlled environment focusing on decreasing scissoring and improving posture.  Pt requires mod manual facilitation for RW control, wt shifts and posture, mod cuing throughout for wider BOS.  Stair negotiation x 6 steps with B handrails with mod A for eccentric control to descend stairs, min A to ascend.  NMR with focus on closed chain position with feet should width apart to decrease trunk ataxia and improve hip and core stability.  Pt requires mod A due to hip weakness B and LE ataxia L > R.  Individual therapy   Oliviana Mcgahee 10/23/2012, 8:50 AM

## 2012-10-23 NOTE — Progress Notes (Signed)
Speech Language Pathology Daily Session Notes  Patient Details  Name: Joe Snow MRN: [id number] Date of Birth: 06/16/1924  Today's Date: 10/23/2012  Session 1 Time: 1000-1030 Time Calculation (min): 30 min  Session 2 Time: 1330-1400  Time Calculation: 30 min  Short Term Goals: Week 1: SLP Short Term Goal 1 (Week 1): Pt will self-monitor and correct wet vocal quality with Mod A assist question cues.  SLP Short Term Goal 1 - Progress (Week 1): Progressing toward goal SLP Short Term Goal 2 (Week 1): Pt will consume trials of ice chips with minimal overt s/s of aspiration in less than 50% of trials with Mod A to assess readiness for objective swallow study SLP Short Term Goal 2 - Progress (Week 1): Progressing toward goal SLP Short Term Goal 3 - Progress (Week 1): Progressing toward goal  Skilled Therapeutic Interventions:  Session 1: Treatment focus on dysphagia goals. Pt asleep in bed and required total A for arousal for participation in therapy for ~30 seconds. Pt required total A for oral care and Max A verbal and tactile cues to self-monitor and correct intermittent wet vocal quality. Pt initiated 3 swallows throughout the entire session and was unable to complete pharyngeal strengthening exercises due to decreased LOA.    Session 2: Treatment focus on dysphagia goals. SLP facilitated session by utilizing NMES in the 2b position with focus on hyolaryngeal elevation. Pt performed pharyngeal strengthening and hyolaryngeal elevation exercises with Mod A verbal and demonstration cues. Pt demonstrated increased self-monitoring and correcting of wet vocal quality and required Mod A verbal and question cues. Pt initiated effortful swallows with Min A verbal cues.   FIM:  Comprehension Comprehension Mode: Auditory Comprehension: 2-Understands basic 25 - 49% of the time/requires cueing 51 - 75% of the time Expression Expression Mode: Verbal Expression: 2-Expresses basic 25 - 49% of the  time/requires cueing 50 - 75% of the time. Uses single words/gestures. Social Interaction Social Interaction: 2-Interacts appropriately 25 - 49% of time - Needs frequent redirection. Problem Solving Problem Solving: 2-Solves basic 25 - 49% of the time - needs direction more than half the time to initiate, plan or complete simple activities Memory Memory: 2-Recognizes or recalls 25 - 49% of the time/requires cueing 51 - 75% of the time FIM - Eating Eating Activity: 0: Activity did not occur  Pain Pain Assessment Pain Assessment: No/denies pain  Therapy/Group: Individual Therapy  Ester Mabe 10/23/2012, 3:38 PM

## 2012-10-23 NOTE — Progress Notes (Signed)
Subjective/Complaints: Less restless this am. Had a very agitated episode around 0200 last night.   A 12 point review of systems has been performed and if not noted above is otherwise negative.      Objective:    Blood pressure 154/89, pulse 69, temperature 98.3 F (36.8 C), temperature source Oral, resp. rate 18, height 5' 11.5" (1.816 m), weight 82.8 kg (182 lb 8.7 oz), SpO2 96.00%. No results found.  Recent Labs  10/23/12 0530  WBC 8.3  HGB 13.2  HCT 42.3  PLT 172    Recent Labs  10/23/12 0530  NA 139  K 3.9  CL 101  GLUCOSE 176*  BUN 25*  CREATININE 1.41*  CALCIUM 9.3   CBG (last 3)   Recent Labs  10/22/12 2323 10/23/12 0417 10/23/12 0721  GLUCAP 153* 177* 189*    Wt Readings from Last 3 Encounters:  10/23/12 82.8 kg (182 lb 8.7 oz)  10/17/12 84.324 kg (185 lb 14.4 oz)  10/17/12 84.324 kg (185 lb 14.4 oz)    Physical Exam:  Vital Signs: Constitutional: He appears well-developed.  HENT: some thrush present on tongue. Voice sounds a little wetter today Head: Normocephalic.  Eyes: EOM are normal.  Neck: Normal range of motion. Neck supple. No thyromegaly present.  Cardiovascular:  Cardiac rate controlled  Pulmonary/Chest: Breath sounds normal. No respiratory distress. No wheezes, rales, rhonchi Abdominal: Soft. Bowel sounds are normal. He exhibits no distension. Gastrostomy tube in place with minimal drainage. Musculoskeletal: He exhibits no edema.  Neurological: He is alert.  Patient is alert. He was able to name person.   More calm today. Speech remains dysarthric but intelligible. Follows all commands. HOH. Right-sided limb ataxia in right arm and leg. Fair sitting balance while in bed. fair reasonable insight and awareness. No gross sensory deficits. Voice not as wet today. Strength nearly 5/5 in all limbs.    Assessment/Plan: 1. 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 Physiatrist is providing close team supervision and 24 hour management of active medical problems listed below. Physiatrist and rehab team continue to assess barriers to discharge/monitor patient progress toward functional and medical goals. FIM: FIM - Bathing Bathing Steps Patient Completed: Chest;Right Arm;Left Arm;Abdomen;Front perineal area;Buttocks Bathing: 3: Mod-Patient completes 5-7 62f 10 parts or 50-74%  FIM - Upper Body Dressing/Undressing Upper body dressing/undressing steps patient completed: Thread/unthread right sleeve of pullover shirt/dresss;Pull shirt over trunk;Put head through opening of pull over shirt/dress;Thread/unthread left sleeve of pullover shirt/dress Upper body dressing/undressing: 5: Supervision: Safety issues/verbal cues FIM - Lower Body Dressing/Undressing Lower body dressing/undressing steps patient completed: Thread/unthread left underwear leg;Thread/unthread right underwear leg;Pull underwear up/down;Thread/unthread right pants leg;Thread/unthread left pants leg;Pull pants up/down;Don/Doff right shoe;Don/Doff left shoe Lower body dressing/undressing: 4: Steadying Assist  FIM - Toileting Toileting: 0: Activity did not occur  FIM - Archivist Transfers: 0-Activity did not occur  FIM - Games developer Transfer: 4: Bed > Chair or W/C: Min A (steadying Pt. > 75%);4: Chair or W/C > Bed: Min A (steadying Pt. > 75%)  FIM - Locomotion: Wheelchair Distance: 35' Locomotion: Wheelchair: 2: Travels 50 - 149 ft with supervision, cueing or coaxing FIM - Locomotion: Ambulation Ambulation/Gait Assistance: 1: +2 Total assist Locomotion: Ambulation: 1: Travels less than 50 ft with minimal assistance (Pt.>75%)  Comprehension Comprehension Mode: Auditory Comprehension: 2-Understands basic 25 - 49% of the time/requires cueing 51 -  75% of the  time  Expression Expression Mode: Verbal Expression: 2-Expresses basic 25 - 49% of the time/requires cueing 50 - 75% of the time. Uses single words/gestures.  Social Interaction Social Interaction: 2-Interacts appropriately 25 - 49% of time - Needs frequent redirection.  Problem Solving Problem Solving Mode: Not assessed Problem Solving: 2-Solves basic 25 - 49% of the time - needs direction more than half the time to initiate, plan or complete simple activities  Memory Memory Mode: Not assessed Memory: 2-Recognizes or recalls 25 - 49% of the time/requires cueing 51 - 75% of the time Medical Problem List and Plan:  1. 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 DVT Prophylaxis/Anticoagulation: Xarelto 15 mg daily.  3. Mood: Ativan 0.5 mg every 6 hours as needed   -increase seroquel to 50mg  hs for restlessness, agitation and to improve sleep.  -labwork fairly unremarkable except for being a little dry. 4. Neuropsych: This patient is capable of making decisions on his/her own behalf.  5. Dysphagia. Gastrostomy tube placed 10/15/2012. Followup speech therapy monitor for any aspiration. Speech and secretion control improved. Still NPO  -increase h20 through tube 6. Hypothyroidism. Synthroid  7. Atrial fibrillation. Continue Xarelto as directed. Cardiac rate fairly well controlled  8. Esophageal candidiasis. Diflucan-dc 9. Diabetes mellitus with peripheral neuropathy. Continue sliding scale covg.  lantus 5u q12 10. HTN: bp still elevated but improved. Increased lisinopril to 20mg  bid  -some improvement except when he was agitated last noc    LOS (Days) 6 A FACE TO FACE EVALUATION WAS PERFORMED  SWARTZ,ZACHARY T 10/23/2012 8:22 AM

## 2012-10-23 NOTE — Progress Notes (Signed)
NUTRITION FOLLOW UP  Intervention:   1. Continue Glucerna 1.2 at 80 ml/hr x 20 hours daily. (Anticipate TF to be held 4 hours daily for participation in therapy.) This goal rate provides: 1920 kcal, 96 grams protein, 1288 ml free water.  2. Continue free water flushes of 250 ml q 4 hours - this will provide an additional 1500 ml water daily  3. RD to continue to follow nutrition care plan  Nutrition Dx:   Inadequate oral intake related to inability to eat as evidenced by NPO status. Ongoing.  Goal:   Intake to meet >90% of estimated nutrition needs. Met.  Monitor:   weight trends, lab trends, I/O's, TF tolerance  Assessment:   Patient admitted 3/3 with R-sided weakness and facial droop. MRI of the brain 3/4 showed small acute non-hemorrhagic infarcts inferior aspect of the R cerebellum and R posterior lateral aspect of the medulla. Patient noted on 3/10 increased confusion and swallowing difficulties. F/u ST with findings of severe oral pharyngeal dysphagia and dysarthria. Swallow study completed with NPO rec's. NGT placed, pt received Jevity 1.2 at 65 ml/hr. There was findings of esophageal candidiasis. Readmitted to acute hospital 3/14 for trach and PEG.  Continues to receive Glucerna 1.2 at 80 ml/hr x 20 hours daily. RN reports that pt is tolerating TF well at this time. Minimal residuals.  Height: Ht Readings from Last 1 Encounters:  10/17/12 5' 11.5" (1.816 m)    Weight Status:   Wt Readings from Last 1 Encounters:  10/23/12 182 lb 8.7 oz (82.8 kg)  Wt stable.  Re-estimated needs:  Kcal: 1900 - 2100 Protein: 90 - 105 grams Fluid: 1.9 - 2.1 liters  Skin: intact  Diet Order: NPO   Intake/Output Summary (Last 24 hours) at 10/23/12 1031 Last data filed at 10/23/12 0527  Gross per 24 hour  Intake      0 ml  Output    450 ml  Net   -450 ml    Last BM: 3/25   Labs:   Recent Labs Lab 10/17/12 0515 10/18/12 0635 10/23/12 0530  NA 139 139 139  K 4.8 4.1 3.9   CL 103 101 101  CO2 29 29 31   BUN 14 15 25*  CREATININE 1.41* 1.39* 1.41*  CALCIUM 9.5 9.3 9.3  MG 1.7  --   --   GLUCOSE 183* 165* 176*    CBG (last 3)   Recent Labs  10/22/12 2323 10/23/12 0417 10/23/12 0721  GLUCAP 153* 177* 189*    Scheduled Meds: . free water  250 mL Per Tube Q4H  . insulin aspart  0-9 Units Subcutaneous Q4H  . insulin glargine  5 Units Subcutaneous Q12H  . levothyroxine  88 mcg Per Tube QAC breakfast  . lisinopril  20 mg Oral BID  . QUEtiapine  50 mg Oral QHS  . rivaroxaban  15 mg Oral Q supper    Continuous Infusions: . feeding supplement (GLUCERNA 1.2 CAL) 1,000 mL (10/21/12 2009)    Jarold Motto MS, RD, LDN Pager: [phone] After-hours pager: [phone]

## 2012-10-24 ENCOUNTER — Inpatient Hospital Stay (HOSPITAL_COMMUNITY): Payer: Medicare Other | Admitting: Physical Therapy

## 2012-10-24 ENCOUNTER — Encounter (HOSPITAL_COMMUNITY): Payer: PRIVATE HEALTH INSURANCE | Admitting: Occupational Therapy

## 2012-10-24 ENCOUNTER — Inpatient Hospital Stay (HOSPITAL_COMMUNITY): Payer: Medicare Other | Admitting: Speech Pathology

## 2012-10-24 LAB — GLUCOSE, CAPILLARY
Glucose-Capillary: 160 mg/dL — ABNORMAL HIGH (ref 70–99)
Glucose-Capillary: 174 mg/dL — ABNORMAL HIGH (ref 70–99)
Glucose-Capillary: 190 mg/dL — ABNORMAL HIGH (ref 70–99)

## 2012-10-24 MED ORDER — INSULIN GLARGINE 100 UNIT/ML ~~LOC~~ SOLN
10.0000 [IU] | Freq: Two times a day (BID) | SUBCUTANEOUS | Status: DC
  Administered 2012-10-24: 10 [IU] via SUBCUTANEOUS
  Filled 2012-10-24 (×2): qty 0.1

## 2012-10-24 MED ORDER — GLUCERNA 1.2 CAL PO LIQD
360.0000 mL | Freq: Every day | ORAL | Status: DC
  Administered 2012-10-24 (×2): 360 mL
  Filled 2012-10-24 (×6): qty 474

## 2012-10-24 NOTE — Progress Notes (Signed)
Physical Therapy Note  Patient Details  Name: Joe Snow MRN: [id number] Date of Birth: [birth year] Today's Date: 10/24/2012  Time: 1300-1330 30 minutes  No c/o pain.  Treatment session focused on gait training with RW with decreased scissoring and widened BOS.  Gait with wooden board between feet with pt able to gait with mod A due to decreased hip stability in wider stance.  Pt unable to carry over wider stance to gait without board despite multiple attempts during session.  Standing with UE support side stepping with focus on hip strength and stability, pt with noticeable hip weakness R > L.  Pt with improved attention and arousal vs morning session.  Individual therapy   Sultan Pargas 10/24/2012, 2:27 PM

## 2012-10-24 NOTE — Progress Notes (Signed)
Physical Therapy Note  Patient Details  Name: Whitman Meinhardt MRN: [id number] Date of Birth: 08/21/1923 Today's Date: 10/24/2012  Time: 800-855 55 minutes  No c/o pain.  Pt with more lethargy noted throughout session, required cues to keep eyes open during session.  Quadruped and tall kneeling work with focus in increasing core and hip stability and control.  Pt able to perform alt UE lifts in quadruped with min A, required mod-max A for wt shifts and UE reaching activities in tall kneeling due to R hip and core weakness.  Able to perform tall kneeling 3 x 4 minutes with breaks between.  Gait training with RW focus on wide BOS, min A for wt shifts, pt able to increase distance to 45' before R knee buckling due to fatigue.  After gait pt became unresponsive to commands, till awake but no verbalizing and unable to follow max physical and verbal cues.  RN made aware.  BP 125/61.  Pt then able to follow commands to propel w/c throughout unit but continues not to verbalize.  RN aware.  Individual therapy   Rolando Whitby 10/24/2012, 8:51 AM

## 2012-10-24 NOTE — Progress Notes (Signed)
NUTRITION FOLLOW UP/CONSULT  Intervention:   1. Transition to bolus regimen at this time. Initiate 120 ml of Glucerna 1.2 via tube five times daily. Advance each bolus by 120 ml until goal bolus regimen of 360 ml five times daily is reached. Infuse bolus slowly. Goal regimen is 360 ml Glucerna 1.2 five times daily. This goal regimen will provide: 2138 kcal, 106 grams protein, 1440 ml free water. 2. Continue free water of 250 ml via tube every 4 hours per MD. 3. RD to continue to follow nutrition care plan  Nutrition Dx:   Inadequate oral intake related to inability to eat as evidenced by NPO status. Ongoing.  Goal:   Intake to meet >90% of estimated nutrition needs. Met.  Monitor:   weight trends, lab trends, I/O's, TF tolerance  Assessment:   Patient admitted 3/3 with R-sided weakness and facial droop. MRI of the brain 3/4 showed small acute non-hemorrhagic infarcts inferior aspect of the R cerebellum and R posterior lateral aspect of the medulla. Patient noted on 3/10 increased confusion and swallowing difficulties. F/u ST with findings of severe oral pharyngeal dysphagia and dysarthria. Swallow study completed with NPO rec's. NGT placed, pt received Jevity 1.2 at 65 ml/hr. There was findings of esophageal candidiasis. Readmitted to acute hospital 3/14 for trach and PEG.  Continues to receive Glucerna 1.2 at 80 ml/hr x 20 hours daily. RN reports that pt is tolerating TF well at this time. Minimal residuals.  RD consulted for transitioning patient to bolus. Discussed with PA and RN. Will order at this time.  Height: Ht Readings from Last 1 Encounters:  10/17/12 5' 11.5" (1.816 m)    Weight Status:   Wt Readings from Last 1 Encounters:  10/23/12 182 lb 8.7 oz (82.8 kg)  Wt stable.  Re-estimated needs:  Kcal: 1900 - 2100 Protein: 90 - 105 grams Fluid: 1.9 - 2.1 liters  Skin: intact  Diet Order: NPO   Intake/Output Summary (Last 24 hours) at 10/24/12 1123 Last data filed at  10/24/12 0643  Gross per 24 hour  Intake      0 ml  Output    800 ml  Net   -800 ml    Last BM: 3/25   Labs:   Recent Labs Lab 10/18/12 0635 10/23/12 0530  NA 139 139  K 4.1 3.9  CL 101 101  CO2 29 31  BUN 15 25*  CREATININE 1.39* 1.41*  CALCIUM 9.3 9.3  GLUCOSE 165* 176*    CBG (last 3)   Recent Labs  10/23/12 1707 10/23/12 2021 10/24/12 0756  GLUCAP 184* 144* 174*    Scheduled Meds: . free water  250 mL Per Tube Q4H  . insulin aspart  0-9 Units Subcutaneous Q4H  . insulin glargine  10 Units Subcutaneous Q12H  . levothyroxine  88 mcg Per Tube QAC breakfast  . lisinopril  20 mg Oral BID  . QUEtiapine  50 mg Oral QHS  . rivaroxaban  15 mg Oral Q supper    Continuous Infusions: . feeding supplement (GLUCERNA 1.2 CAL) 1,000 mL (10/21/12 2009)    Jarold Motto MS, RD, LDN Pager: [phone] After-hours pager: [phone]

## 2012-10-24 NOTE — Evaluation (Signed)
Speech Language Pathology Assessment and Plan  Patient Details  Name: Joe Snow MRN: [id number] Date of Birth: October 27, 1923  SLP Diagnosis: Dysphagia;Voice disorder;Cognitive Impairments  Rehab Potential: Good ELOS: 2.5 weeks  Today's Date: 10/24/2012 Time: 7829-5621 Time Calculation (min): 55 min  Skilled Therapeutic Intervention: Treatment focus on dysphagia goals. SLP facilitated session by utilizing NMES in the 2b position with focus on hyolaryngeal elevation. Pt performed pharyngeal strengthening and hyolaryngeal elevation exercises with Mod A verbal and demonstration cues. Pt demonstrated increased self-monitoring and correcting of wet vocal quality and required Min A verbal and question cues. Pt initiated effortful swallows with Min A verbal cues. Pt required Max A verbal and tactile cues to sustain attention to task due to lethargy and fatigue. Trials were not attempted due to pt's severe lethargy and fatigue. Due to pt's lethargy and sustained attention, dysphagia treatment was terminated and a cognitive linguistic evaluation was administered to assess current cognitive function. Please see below for details.   Problem List:  Patient Active Problem List  Diagnosis  . Embolic stroke involving left vertebral artery  . Chronic a-fib  . Diabetes  . Essential hypertension, benign  . Shortness of breath  . Dysphagia, pharyngeal-neurogenic  . Esophageal candidiasis  . Hiccups  . COPD exacerbation  . OSA on CPAP  . CVA (cerebral infarction)  . Dysphagia, unspecified  . Acute respiratory failure  . Embolic cerebrovascular disease   Past Medical History:  Past Medical History  Diagnosis Date  . Hypertension associated with diabetes   . Diabetes   . Atrial fibrillation   . Embolic stroke involving left vertebral artery 10/02/2012  . Dysphagia, pharyngeal-neurogenic 10/05/2012  . Dysrhythmia     Conjenatle A-Fib   Past Surgical History:  Past Surgical History  Procedure  Laterality Date  . Prostate surgery N/A 1995    estimate  . Tonsillectomy Bilateral 1930s  . Esophagogastroduodenoscopy N/A 10/05/2012    Procedure: ESOPHAGOGASTRODUODENOSCOPY (EGD);  Surgeon: Iva Boop, MD;  [facility] ENDOSCOPY;  Service: Endoscopy;  Laterality: N/A;  . Peg placement N/A 10/15/2012    Procedure: PERCUTANEOUS ENDOSCOPIC GASTROSTOMY (PEG) PLACEMENT;  Surgeon: Beverley Fiedler, MD;  Location: [facility] ENDOSCOPY;  Service: Gastroenterology;  Laterality: N/A;    Assessment / Plan / Recommendation Clinical Impression  Pt's overall cognitive function has been inconsistent the past week and impacting his ability to participate in ADL's, therefore a cognitive evaluation was administered. Pt demonstrates severe cognitive impairments impacting sustained attention, functional problem solving, working memory, intellectual awareness, initiation, perseveration and delayed processing. Pt/family education ongoing. Pt is currently being seen for skilled SLP intervention for dysphagia treatment but would benefit from cognitive therapy to maximize overall independence.     SLP Assessment  Patient will need skilled Speech Lanaguage Pathology Services during CIR admission    Recommendations  Diet Recommendations: NPO Medication Administration: Via alternative means Oral Care Recommendations: Oral care QID Patient destination: Home Follow up Recommendations: Outpatient SLP;24 hour supervision/assistance Equipment Recommended: None recommended by SLP    SLP Frequency 5 out of 7 days   SLP Treatment/Interventions Cognitive remediation/compensation;Cueing hierarchy;Environmental controls;Dysphagia/aspiration precaution training;Functional tasks;Internal/external aids;Patient/family education;Oral motor exercises;Speech/Language facilitation;Therapeutic Activities;Neuromuscular electrical stimulation;Therapeutic Exercise    Pain No/Denies Pain  Short Term Goals: Week 1: SLP Short Term Goal 1 (Week  1): Pt will self-monitor and correct wet vocal quality with Mod A assist question cues.  SLP Short Term Goal 1 - Progress (Week 1): Progressing toward goal SLP Short Term Goal 2 (Week 1): Pt will consume trials  of ice chips with minimal overt s/s of aspiration in less than 50% of trials with Mod A to assess readiness for objective swallow study SLP Short Term Goal 2 - Progress (Week 1): Progressing toward goal SLP Short Term Goal 3 - Progress (Week 1): Progressing toward goal SLP Short Term Goal 4 (Week 1): Pt will demonstrate sustained attention to task for 5 minutes with Mod A verbal cues.  SLP Short Term Goal 5 (Week 1): Pt will demonstrate functional problem solving for basic and familiar tasks with Mod A verbal cues.   See FIM for current functional status Refer to Care Plan for Long Term Goals  Recommendations for other services: None  Discharge Criteria: Patient will be discharged from SLP if patient refuses treatment 3 consecutive times without medical reason, if treatment goals not met, if there is a change in medical status, if patient makes no progress towards goals or if patient is discharged from hospital.  The above assessment, treatment plan, treatment alternatives and goals were discussed and mutually agreed upon: by patient and by family  Abad Manard 10/24/2012, 4:03 PM

## 2012-10-24 NOTE — Progress Notes (Signed)
Subjective/Complaints: Had a much better night. Slept until about 0745 this am.  A 12 point review of systems has been performed and if not noted above is otherwise negative.      Objective:    Blood pressure 151/85, pulse 70, temperature 97.8 F (36.6 C), temperature source Oral, resp. rate 18, height 5' 11.5" (1.816 m), weight 82.8 kg (182 lb 8.7 oz), SpO2 100.00%. No results found.  Recent Labs  10/23/12 0530  WBC 8.3  HGB 13.2  HCT 42.3  PLT 172    Recent Labs  10/23/12 0530  NA 139  K 3.9  CL 101  GLUCOSE 176*  BUN 25*  CREATININE 1.41*  CALCIUM 9.3   CBG (last 3)   Recent Labs  10/23/12 1707 10/23/12 2021 10/24/12 0756  GLUCAP 184* 144* 174*    Wt Readings from Last 3 Encounters:  10/23/12 82.8 kg (182 lb 8.7 oz)  10/17/12 84.324 kg (185 lb 14.4 oz)  10/17/12 84.324 kg (185 lb 14.4 oz)    Physical Exam:  Vital Signs: Constitutional: He appears well-developed.  HENT: some thrush present on tongue. Voice sounds a little wetter today Head: Normocephalic.  Eyes: EOM are normal.  Neck: Normal range of motion. Neck supple. No thyromegaly present.  Cardiovascular:  Cardiac rate controlled  Pulmonary/Chest: Breath sounds normal. No respiratory distress. No wheezes, rales, rhonchi Abdominal: Soft. Bowel sounds are normal. He exhibits no distension. Gastrostomy tube in place with minimal drainage. Musculoskeletal: He exhibits no edema.  Neurological: He is alert.  Patient is alert. He was able to name person.   More calm today. Speech remains dysarthric but intelligible. Follows all commands. HOH. Right-sided limb ataxia in right arm and leg. Fair sitting balance while in bed. fair reasonable insight and awareness. No gross sensory deficits. Voice still a little wet. Strength nearly 5/5 in all limbs.    Assessment/Plan: 1. Functional deficits secondary to thromboembolic right cerebellum and posterior lateral medullary infarcts which require 3+ hours  per day of interdisciplinary therapy in a comprehensive inpatient rehab setting. Physiatrist is providing close team supervision and 24 hour management of active medical problems listed below. Physiatrist and rehab team continue to assess barriers to discharge/monitor patient progress toward functional and medical goals. FIM: FIM - Bathing Bathing Steps Patient Completed: Chest;Right Arm;Left Arm;Abdomen;Front perineal area;Buttocks Bathing: 3: Mod-Patient completes 5-7 70f 10 parts or 50-74%  FIM - Upper Body Dressing/Undressing Upper body dressing/undressing steps patient completed: Thread/unthread right sleeve of pullover shirt/dresss;Pull shirt over trunk;Put head through opening of pull over shirt/dress;Thread/unthread left sleeve of pullover shirt/dress Upper body dressing/undressing: 5: Supervision: Safety issues/verbal cues FIM - Lower Body Dressing/Undressing Lower body dressing/undressing steps patient completed: Thread/unthread left underwear leg;Thread/unthread right underwear leg;Pull underwear up/down;Thread/unthread right pants leg;Thread/unthread left pants leg;Pull pants up/down;Don/Doff right shoe;Don/Doff left shoe Lower body dressing/undressing: 4: Steadying Assist  FIM - Toileting Toileting: 0: Activity did not occur  FIM - Archivist Transfers: 0-Activity did not occur  FIM - Games developer Transfer: 4: Bed > Chair or W/C: Min A (steadying Pt. > 75%);4: Chair or W/C > Bed: Min A (steadying Pt. > 75%)  FIM - Locomotion: Wheelchair Distance: 35' Locomotion: Wheelchair: 2: Travels 50 - 149 ft with supervision, cueing or coaxing FIM - Locomotion: Ambulation Ambulation/Gait Assistance: 1: +2 Total assist Locomotion: Ambulation: 1: Travels less than 50 ft with moderate assistance (Pt: 50 - 74%)  Comprehension Comprehension Mode: Auditory Comprehension: 2-Understands basic 25 - 49% of the time/requires cueing 51 -  75% of the  time  Expression Expression Mode: Verbal Expression: 2-Expresses basic 25 - 49% of the time/requires cueing 50 - 75% of the time. Uses single words/gestures.  Social Interaction Social Interaction: 2-Interacts appropriately 25 - 49% of time - Needs frequent redirection.  Problem Solving Problem Solving Mode: Not assessed Problem Solving: 2-Solves basic 25 - 49% of the time - needs direction more than half the time to initiate, plan or complete simple activities  Memory Memory Mode: Not assessed Memory: 2-Recognizes or recalls 25 - 49% of the time/requires cueing 51 - 75% of the time Medical Problem List and Plan:  1. 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 DVT Prophylaxis/Anticoagulation: Xarelto 15 mg daily.  3. Mood: Ativan 0.5 mg every 6 hours as needed   -increased seroquel to 50mg  hs for restlessness, agitation and to improve sleep---this has helped  -labwork fairly unremarkable except for being a little dry. 4. Neuropsych: This patient is capable of making decisions on his/her own behalf.  5. Dysphagia. Gastrostomy tube placed 10/15/2012. Followup speech therapy monitor for any aspiration. Speech and secretion control improved. Still NPO  -increased h20 through tube- recheck tomorrow 6. Hypothyroidism. Synthroid  7. Atrial fibrillation. Continue Xarelto as directed. Cardiac rate fairly well controlled  8. Esophageal candidiasis. Diflucan-d'ed 9. Diabetes mellitus with peripheral neuropathy. Continue sliding scale covg. Increase lantus to 10u q12 10. HTN: bp still elevated but improved. Increased lisinopril to 20mg  bid  -some improvement except when he was agitated last noc    LOS (Days) 7 A FACE TO FACE EVALUATION WAS PERFORMED  Arlene Genova T 10/24/2012 8:08 AM

## 2012-10-24 NOTE — Progress Notes (Signed)
Occupational Therapy Session Note  Patient Details  Name: Joe Snow MRN: [id number] Date of Birth: Nov 03, 1923  Today's Date: 10/24/2012 Time: 1030-1130 Time Calculation (min): 60 min   Skilled Therapeutic Interventions/Progress Updates:    1:1 self care retraining. Pt in bed asleep when arrived. Brought pt to EOB (total ) to arouse pt; pt became alert and reported he needed to use the urinal. Pt required mod A to use urinal and total A for clothing management. Pt able to participate in sponge bath, dressing and grooming. Pt with improved attention to task and increased attention to balance and self corrected balance with UE support. Pt perform sit to stands and transfers with min A. Pt able to shave himself with extra time time (10 min total) demonstrating increased degrees of freedom in UE movements with decreased ataxia noted. Pt left in the w/c with safety belt on and left with family.  Therapy Documentation Precautions:  Precautions Precautions: Fall Precaution Comments: Abdominal Binder, PEG tube Restrictions Weight Bearing Restrictions: No Pain:  no c/o pain   See FIM for current functional status  Therapy/Group: Individual Therapy  Roney Mans [facility] 10/24/2012, 3:19 PM

## 2012-10-25 ENCOUNTER — Inpatient Hospital Stay (HOSPITAL_COMMUNITY): Payer: Medicare Other

## 2012-10-25 ENCOUNTER — Inpatient Hospital Stay (HOSPITAL_COMMUNITY): Payer: Medicare Other | Admitting: Physical Therapy

## 2012-10-25 ENCOUNTER — Inpatient Hospital Stay (HOSPITAL_COMMUNITY)
Admission: AD | Admit: 2012-10-25 | Discharge: 2012-10-29 | DRG: 871 | Disposition: E | Payer: Medicare Other | Source: Ambulatory Visit | Attending: Internal Medicine | Admitting: Internal Medicine

## 2012-10-25 ENCOUNTER — Encounter (HOSPITAL_COMMUNITY): Payer: Self-pay

## 2012-10-25 ENCOUNTER — Inpatient Hospital Stay (HOSPITAL_COMMUNITY): Payer: PRIVATE HEALTH INSURANCE | Admitting: Speech Pathology

## 2012-10-25 ENCOUNTER — Inpatient Hospital Stay (HOSPITAL_COMMUNITY): Payer: PRIVATE HEALTH INSURANCE | Admitting: Physical Therapy

## 2012-10-25 DIAGNOSIS — R29898 Other symptoms and signs involving the musculoskeletal system: Secondary | ICD-10-CM | POA: Diagnosis present

## 2012-10-25 DIAGNOSIS — R6521 Severe sepsis with septic shock: Secondary | ICD-10-CM | POA: Diagnosis present

## 2012-10-25 DIAGNOSIS — R569 Unspecified convulsions: Secondary | ICD-10-CM | POA: Diagnosis not present

## 2012-10-25 DIAGNOSIS — J9600 Acute respiratory failure, unspecified whether with hypoxia or hypercapnia: Secondary | ICD-10-CM | POA: Diagnosis present

## 2012-10-25 DIAGNOSIS — N170 Acute kidney failure with tubular necrosis: Secondary | ICD-10-CM | POA: Diagnosis present

## 2012-10-25 DIAGNOSIS — Z87891 Personal history of nicotine dependence: Secondary | ICD-10-CM

## 2012-10-25 DIAGNOSIS — R6520 Severe sepsis without septic shock: Secondary | ICD-10-CM | POA: Diagnosis present

## 2012-10-25 DIAGNOSIS — E876 Hypokalemia: Secondary | ICD-10-CM | POA: Diagnosis not present

## 2012-10-25 DIAGNOSIS — Z66 Do not resuscitate: Secondary | ICD-10-CM | POA: Diagnosis not present

## 2012-10-25 DIAGNOSIS — R1310 Dysphagia, unspecified: Secondary | ICD-10-CM

## 2012-10-25 DIAGNOSIS — I4891 Unspecified atrial fibrillation: Secondary | ICD-10-CM | POA: Diagnosis present

## 2012-10-25 DIAGNOSIS — G4730 Sleep apnea, unspecified: Secondary | ICD-10-CM | POA: Diagnosis present

## 2012-10-25 DIAGNOSIS — Z931 Gastrostomy status: Secondary | ICD-10-CM

## 2012-10-25 DIAGNOSIS — Z515 Encounter for palliative care: Secondary | ICD-10-CM

## 2012-10-25 DIAGNOSIS — E872 Acidosis, unspecified: Secondary | ICD-10-CM | POA: Diagnosis not present

## 2012-10-25 DIAGNOSIS — I6350 Cerebral infarction due to unspecified occlusion or stenosis of unspecified cerebral artery: Secondary | ICD-10-CM

## 2012-10-25 DIAGNOSIS — A419 Sepsis, unspecified organism: Principal | ICD-10-CM | POA: Diagnosis present

## 2012-10-25 DIAGNOSIS — E119 Type 2 diabetes mellitus without complications: Secondary | ICD-10-CM | POA: Diagnosis present

## 2012-10-25 DIAGNOSIS — Z8673 Personal history of transient ischemic attack (TIA), and cerebral infarction without residual deficits: Secondary | ICD-10-CM

## 2012-10-25 DIAGNOSIS — J441 Chronic obstructive pulmonary disease with (acute) exacerbation: Secondary | ICD-10-CM

## 2012-10-25 DIAGNOSIS — R1313 Dysphagia, pharyngeal phase: Secondary | ICD-10-CM | POA: Diagnosis present

## 2012-10-25 DIAGNOSIS — I639 Cerebral infarction, unspecified: Secondary | ICD-10-CM

## 2012-10-25 DIAGNOSIS — N189 Chronic kidney disease, unspecified: Secondary | ICD-10-CM | POA: Diagnosis present

## 2012-10-25 DIAGNOSIS — J690 Pneumonitis due to inhalation of food and vomit: Secondary | ICD-10-CM | POA: Diagnosis present

## 2012-10-25 DIAGNOSIS — I129 Hypertensive chronic kidney disease with stage 1 through stage 4 chronic kidney disease, or unspecified chronic kidney disease: Secondary | ICD-10-CM | POA: Diagnosis present

## 2012-10-25 LAB — CBC WITH DIFFERENTIAL/PLATELET
Basophils Absolute: 0 10*3/uL (ref 0.0–0.1)
Eosinophils Absolute: 0 10*3/uL (ref 0.0–0.7)
Lymphs Abs: 2.4 10*3/uL (ref 0.7–4.0)
MCH: 26.5 pg (ref 26.0–34.0)
Neutrophils Relative %: 77 % (ref 43–77)
Platelets: 204 10*3/uL (ref 150–400)
RBC: 5.5 MIL/uL (ref 4.22–5.81)
WBC: 12.5 10*3/uL — ABNORMAL HIGH (ref 4.0–10.5)

## 2012-10-25 LAB — BASIC METABOLIC PANEL
BUN: 35 mg/dL — ABNORMAL HIGH (ref 6–23)
BUN: 38 mg/dL — ABNORMAL HIGH (ref 6–23)
Chloride: 99 mEq/L (ref 96–112)
Chloride: 105 mEq/L (ref 96–112)
Creatinine, Ser: 1.73 mg/dL — ABNORMAL HIGH (ref 0.50–1.35)
GFR calc Af Amer: 39 mL/min — ABNORMAL LOW (ref >90)
GFR calc Af Amer: 29 mL/min — ABNORMAL LOW (ref >90)
GFR calc non Af Amer: 34 mL/min — ABNORMAL LOW (ref >90)
Glucose, Bld: 193 mg/dL — ABNORMAL HIGH (ref 70–99)
Potassium: 4.5 mEq/L (ref 3.5–5.1)
Sodium: 140 mEq/L (ref 135–145)

## 2012-10-25 LAB — BLOOD GAS, ARTERIAL
Acid-base deficit: 2.6 mmol/L — ABNORMAL HIGH (ref 0.0–2.0)
Drawn by: 31101
FIO2: 30 %
MECHVT: 550 mL
O2 Content: 4 L/min
TCO2: 21.5 mmol/L (ref 0–100)
pCO2 arterial: 55 mmHg — ABNORMAL HIGH (ref 35.0–45.0)
pCO2 arterial: 28.9 mmHg — ABNORMAL LOW (ref 35.0–45.0)
pO2, Arterial: 96.9 mmHg (ref 80.0–100.0)

## 2012-10-25 LAB — POCT I-STAT 3, ART BLOOD GAS (G3+)
Acid-Base Excess: 1 mmol/L (ref 0.0–2.0)
Bicarbonate: 28.7 mEq/L — ABNORMAL HIGH (ref 20.0–24.0)
O2 Saturation: 99 %
O2 Saturation: 100 %
Patient temperature: 98.6
TCO2: 31 mmol/L (ref 0–100)
pCO2 arterial: 53.2 mmHg — ABNORMAL HIGH (ref 35.0–45.0)
pH, Arterial: 7.29 — ABNORMAL LOW (ref 7.350–7.450)

## 2012-10-25 LAB — GLUCOSE, CAPILLARY
Glucose-Capillary: 243 mg/dL — ABNORMAL HIGH (ref 70–99)
Glucose-Capillary: 160 mg/dL — ABNORMAL HIGH (ref 70–99)
Glucose-Capillary: 195 mg/dL — ABNORMAL HIGH (ref 70–99)
Glucose-Capillary: 170 mg/dL — ABNORMAL HIGH (ref 70–99)
Glucose-Capillary: 160 mg/dL — ABNORMAL HIGH (ref 70–99)
Glucose-Capillary: 141 mg/dL — ABNORMAL HIGH (ref 70–99)

## 2012-10-25 LAB — LACTIC ACID, PLASMA
Lactic Acid, Venous: 2.1 mmol/L (ref 0.5–2.2)
Lactic Acid, Venous: 2.9 mmol/L — ABNORMAL HIGH (ref 0.5–2.2)

## 2012-10-25 LAB — URINALYSIS, ROUTINE W REFLEX MICROSCOPIC
Glucose, UA: NEGATIVE mg/dL
Ketones, ur: NEGATIVE mg/dL
Leukocytes, UA: NEGATIVE
Nitrite: NEGATIVE
Specific Gravity, Urine: 1.019 (ref 1.005–1.030)
pH: 6.5 (ref 5.0–8.0)

## 2012-10-25 LAB — CARBOXYHEMOGLOBIN
Methemoglobin: 0.7 % (ref 0.0–1.5)
Total hemoglobin: 12.2 g/dL — ABNORMAL LOW (ref 13.5–18.0)

## 2012-10-25 LAB — URINE MICROSCOPIC-ADD ON

## 2012-10-25 LAB — TROPONIN I
Troponin I: <0.3 ng/mL (ref <0.30)
Troponin I: <0.3 ng/mL (ref <0.30)

## 2012-10-25 LAB — LIPASE, BLOOD: Lipase: 64 U/L — ABNORMAL HIGH (ref 11–59)

## 2012-10-25 MED ORDER — ROCURONIUM BROMIDE 50 MG/5ML IV SOLN
1.0000 mg/kg | Freq: Once | INTRAVENOUS | Status: AC
  Administered 2012-10-25: 70 mg via INTRAVENOUS

## 2012-10-25 MED ORDER — SODIUM CHLORIDE 0.9 % IV BOLUS (SEPSIS)
1000.0000 mL | Freq: Once | INTRAVENOUS | Status: AC
  Administered 2012-10-25: 1000 mL via INTRAVENOUS

## 2012-10-25 MED ORDER — FAMOTIDINE IN NACL 20-0.9 MG/50ML-% IV SOLN
20.0000 mg | Freq: Two times a day (BID) | INTRAVENOUS | Status: DC
  Administered 2012-10-25 – 2012-10-26 (×3): 20 mg via INTRAVENOUS
  Filled 2012-10-25 (×4): qty 50

## 2012-10-25 MED ORDER — FENTANYL CITRATE 0.05 MG/ML IJ SOLN
50.0000 ug | INTRAMUSCULAR | Status: DC | PRN
  Administered 2012-10-25 – 2012-10-26 (×3): 100 ug via INTRAVENOUS
  Filled 2012-10-25 (×3): qty 2

## 2012-10-25 MED ORDER — MIDAZOLAM HCL 2 MG/2ML IJ SOLN
2.0000 mg | INTRAMUSCULAR | Status: DC | PRN
  Administered 2012-10-25 – 2012-10-26 (×6): 2 mg via INTRAVENOUS
  Filled 2012-10-25 (×7): qty 2

## 2012-10-25 MED ORDER — NOREPINEPHRINE BITARTRATE 1 MG/ML IJ SOLN
2.0000 ug/min | INTRAVENOUS | Status: DC
  Administered 2012-10-25: 15 ug/min via INTRAVENOUS
  Administered 2012-10-26: 12 ug/min via INTRAVENOUS
  Filled 2012-10-25 (×2): qty 16

## 2012-10-25 MED ORDER — GLUCERNA 1.2 CAL PO LIQD
1000.0000 mL | ORAL | Status: DC
  Filled 2012-10-25: qty 1000

## 2012-10-25 MED ORDER — PANTOPRAZOLE SODIUM 40 MG IV SOLR
40.0000 mg | Freq: Every day | INTRAVENOUS | Status: DC
  Administered 2012-10-25 – 2012-10-26 (×2): 40 mg via INTRAVENOUS
  Filled 2012-10-25 (×3): qty 40

## 2012-10-25 MED ORDER — VANCOMYCIN HCL 10 G IV SOLR
1750.0000 mg | Freq: Once | INTRAVENOUS | Status: AC
  Administered 2012-10-25: 1750 mg via INTRAVENOUS
  Filled 2012-10-25: qty 1750

## 2012-10-25 MED ORDER — LORAZEPAM 2 MG/ML IJ SOLN
INTRAMUSCULAR | Status: AC
  Administered 2012-10-25: 2 mg via INTRAVENOUS
  Filled 2012-10-25: qty 1

## 2012-10-25 MED ORDER — VASOPRESSIN 20 UNIT/ML IJ SOLN
0.0300 [IU]/min | INTRAVENOUS | Status: DC
  Administered 2012-10-25 – 2012-10-26 (×2): 0.03 [IU]/min via INTRAVENOUS
  Filled 2012-10-25 (×2): qty 2.5

## 2012-10-25 MED ORDER — VANCOMYCIN HCL IN DEXTROSE 1-5 GM/200ML-% IV SOLN
1000.0000 mg | INTRAVENOUS | Status: DC
  Administered 2012-10-26 – 2012-10-27 (×2): 1000 mg via INTRAVENOUS
  Filled 2012-10-25 (×3): qty 200

## 2012-10-25 MED ORDER — BIOTENE DRY MOUTH MT LIQD
15.0000 mL | Freq: Four times a day (QID) | OROMUCOSAL | Status: DC
  Administered 2012-10-25 – 2012-10-27 (×7): 15 mL via OROMUCOSAL

## 2012-10-25 MED ORDER — SODIUM CHLORIDE 0.9 % IV SOLN
INTRAVENOUS | Status: DC
  Administered 2012-10-25: 08:00:00 via INTRAVENOUS

## 2012-10-25 MED ORDER — INSULIN ASPART 100 UNIT/ML ~~LOC~~ SOLN
0.0000 [IU] | SUBCUTANEOUS | Status: DC
  Administered 2012-10-25: 5 [IU] via SUBCUTANEOUS
  Administered 2012-10-25: 2 [IU] via SUBCUTANEOUS
  Administered 2012-10-25 – 2012-10-26 (×6): 3 [IU] via SUBCUTANEOUS
  Administered 2012-10-26: 2 [IU] via SUBCUTANEOUS
  Administered 2012-10-26 – 2012-10-27 (×3): 3 [IU] via SUBCUTANEOUS
  Administered 2012-10-27: 8 [IU] via SUBCUTANEOUS
  Administered 2012-10-27: 3 [IU] via SUBCUTANEOUS

## 2012-10-25 MED ORDER — PIPERACILLIN-TAZOBACTAM 3.375 G IVPB
3.3750 g | Freq: Three times a day (TID) | INTRAVENOUS | Status: DC
  Administered 2012-10-25 – 2012-10-27 (×6): 3.375 g via INTRAVENOUS
  Filled 2012-10-25 (×10): qty 50

## 2012-10-25 MED ORDER — PIPERACILLIN-TAZOBACTAM 3.375 G IVPB 30 MIN
3.3750 g | Freq: Once | INTRAVENOUS | Status: AC
  Administered 2012-10-25: 3.375 g via INTRAVENOUS
  Filled 2012-10-25: qty 50

## 2012-10-25 MED ORDER — ENOXAPARIN SODIUM 30 MG/0.3ML ~~LOC~~ SOLN
30.0000 mg | SUBCUTANEOUS | Status: DC
  Administered 2012-10-25 – 2012-10-26 (×2): 30 mg via SUBCUTANEOUS
  Filled 2012-10-25 (×3): qty 0.3

## 2012-10-25 MED ORDER — ETOMIDATE 2 MG/ML IV SOLN
0.3000 mg/kg | Freq: Once | INTRAVENOUS | Status: AC
  Administered 2012-10-25: 20 mg via INTRAVENOUS

## 2012-10-25 MED ORDER — CHLORHEXIDINE GLUCONATE 0.12 % MT SOLN
15.0000 mL | Freq: Two times a day (BID) | OROMUCOSAL | Status: DC
  Administered 2012-10-25 – 2012-10-27 (×5): 15 mL via OROMUCOSAL
  Filled 2012-10-25 (×5): qty 15

## 2012-10-25 MED ORDER — LORAZEPAM 2 MG/ML IJ SOLN: 2.0000 mg | Freq: Once | INTRAMUSCULAR | Status: AC

## 2012-10-25 MED ORDER — SODIUM CHLORIDE 0.9 % IV SOLN
10.0000 ug/h | INTRAVENOUS | Status: DC
  Administered 2012-10-25: 100 ug/h via INTRAVENOUS
  Administered 2012-10-25: 200 ug/h via INTRAVENOUS
  Filled 2012-10-25 (×3): qty 50

## 2012-10-25 MED ORDER — SODIUM CHLORIDE 0.9 % IV SOLN: 250.0000 mL | INTRAVENOUS | Status: DC | PRN

## 2012-10-25 MED ORDER — GLUCERNA 1.2 CAL PO LIQD
1000.0000 mL | ORAL | Status: DC
  Filled 2012-10-25 (×3): qty 1000

## 2012-10-25 MED ORDER — FREE WATER: 150.0000 mL | Status: DC

## 2012-10-25 NOTE — Care Management Note (Signed)
    Page 1 of 1   2012-11-21     9:50:33 AM   CARE MANAGEMENT NOTE 11/21/12  Patient:  Joe Snow, Joe Snow   Account Number:  [id number]  Date Initiated:  11-21-12  Documentation initiated by:  Junius Creamer  Subjective/Objective Assessment:   adm w septic shock     Action/Plan:   lives w wife   Anticipated DC Date:     Anticipated DC Plan:        DC Planning Services  CM consult      Choice offered to / List presented to:             Status of service:   Medicare Important Message given?   (If response is "NO", the following Medicare IM given date fields will be blank) Date Medicare IM given:   Date Additional Medicare IM given:    Discharge Disposition:    Per UR Regulation:  Reviewed for med. necessity/level of care/duration of stay  If discussed at Long Length of Stay Meetings, dates discussed:    Comments:  3/28 0949 debbie Taiz Bickle rn,bsn

## 2012-10-25 NOTE — Progress Notes (Signed)
Subjective/Complaints: Vomited. Likely aspirated. Respiratory distress. Quite restless now  A 12 point review of systems has been performed and if not noted above is otherwise negative.      Objective:    Blood pressure 101/56, pulse 96, temperature 97.5 F (36.4 C), temperature source Oral, resp. rate 20, height 5' 11.5" (1.816 m), weight 82.8 kg (182 lb 8.7 oz), SpO2 93.00%. Dg Chest Port 1 View  11/01/12  *RADIOLOGY REPORT*  Clinical Data: Cough.  Short of breath.  Congestion.  Aspiration.  PORTABLE CHEST - 1 VIEW  Comparison: 10/16/2012.  Findings: Patient is rotated to the left.  There is no airspace disease to suggest aspiration pneumonitis.  Cardiopericardial silhouette appears normal.  Tortuous thoracic aortic arch with atherosclerotic calcification.  IMPRESSION: No acute abnormality.  Negative for aspiration.   Original Report Authenticated By: Andreas Newport, M.D.     Recent Labs  10/23/12 0530  WBC 8.3  HGB 13.2  HCT 42.3  PLT 172    Recent Labs  10/23/12 0530  NA 139  K 3.9  CL 101  GLUCOSE 176*  BUN 25*  CREATININE 1.41*  CALCIUM 9.3   CBG (last 3)   Recent Labs  10/24/12 2217 2012-11-01 0132 2012-11-01 0541  GLUCAP 141* 243* 160*    Wt Readings from Last 3 Encounters:  10/23/12 82.8 kg (182 lb 8.7 oz)  10/17/12 84.324 kg (185 lb 14.4 oz)  10/17/12 84.324 kg (185 lb 14.4 oz)    Physical Exam:  Vital Signs: Constitutional: He appears well-developed.  HENT: some thrush present on tongue. Voice sounds a little wetter today Head: Normocephalic.  Eyes: EOM are normal.  Neck: Normal range of motion. Neck supple. No thyromegaly present.  Cardiovascular:  tachycardic Pulmonary/Chest: respiratory distress, scattered upper airway sounds,  Abdominal: Soft. Bowel sounds are normal. He exhibits no distension. Gastrostomy tube in place with minimal drainage. Musculoskeletal: He exhibits no edema.  Neurological: He is alert.  Pt is restless in bed. Very  distracted limbs.    Assessment/Plan: 1. Functional deficits secondary to thromboembolic right cerebellum and posterior lateral medullary infarcts which require 3+ hours per day of interdisciplinary therapy in a comprehensive inpatient rehab setting. Physiatrist is providing close team supervision and 24 hour management of active medical problems listed below. Physiatrist and rehab team continue to assess barriers to discharge/monitor patient progress toward functional and medical goals. FIM: FIM - Bathing Bathing Steps Patient Completed: Chest;Right Arm;Left Arm;Abdomen;Front perineal area;Buttocks Bathing: 3: Mod-Patient completes 5-7 30f 10 parts or 50-74%  FIM - Upper Body Dressing/Undressing Upper body dressing/undressing steps patient completed: Thread/unthread right sleeve of pullover shirt/dresss;Pull shirt over trunk;Put head through opening of pull over shirt/dress;Thread/unthread left sleeve of pullover shirt/dress Upper body dressing/undressing: 5: Supervision: Safety issues/verbal cues FIM - Lower Body Dressing/Undressing Lower body dressing/undressing steps patient completed: Thread/unthread left underwear leg;Thread/unthread right underwear leg;Pull underwear up/down;Thread/unthread right pants leg;Thread/unthread left pants leg;Pull pants up/down;Don/Doff right shoe;Don/Doff left shoe Lower body dressing/undressing: 4: Steadying Assist  FIM - Toileting Toileting: 0: Activity did not occur  FIM - Archivist Transfers: 0-Activity did not occur  FIM - Games developer Transfer: 4: Chair or W/C > Bed: Min A (steadying Pt. > 75%);4: Bed > Chair or W/C: Min A (steadying Pt. > 75%)  FIM - Locomotion: Wheelchair Distance: 35' Locomotion: Wheelchair: 2: Travels 50 - 149 ft with supervision, cueing or coaxing FIM - Locomotion: Ambulation Ambulation/Gait Assistance: 1: +2 Total assist Locomotion: Ambulation: 1: Travels less than 50 ft with minimal  assistance (Pt.>75%)  Comprehension Comprehension Mode: Auditory Comprehension: 2-Understands basic 25 - 49% of the time/requires cueing 51 - 75% of the time  Expression Expression Mode: Verbal Expression: 2-Expresses basic 25 - 49% of the time/requires cueing 50 - 75% of the time. Uses single words/gestures.  Social Interaction Social Interaction: 2-Interacts appropriately 25 - 49% of time - Needs frequent redirection.  Problem Solving Problem Solving Mode: Not assessed Problem Solving: 2-Solves basic 25 - 49% of the time - needs direction more than half the time to initiate, plan or complete simple activities  Memory Memory Mode: Not assessed Memory: 2-Recognizes or recalls 25 - 49% of the time/requires cueing 51 - 75% of the time Medical Problem List and Plan:  1. 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 DVT Prophylaxis/Anticoagulation: Xarelto 15 mg daily.  3. Mood: Ativan 0.5 mg every 6 hours as needed   -hs seroquel has been effective 4. Neuropsych: This patient is not capable of making decisions on his/her own behalf.  5. Dysphagia. Gastrostomy tube placed 10/15/2012. Followup speech therapy monitor for any aspiration. Speech and secretion control improved. Still NPO  -bolus feeds attempted. May be related to vomiting/aspiration event  6. Hypothyroidism. Synthroid  7. Atrial fibrillation. Continue Xarelto as directed. Cardiac rate fairly well controlled  8. Esophageal candidiasis. Diflucan-d'ed 9. Diabetes mellitus with peripheral neuropathy. Continue sliding scale covg. Increase lantus to 10u q12 10. HTN: bp still elevated but improved. Increased lisinopril to 20mg  bid  -some improvement except when he was agitated last noc 11. Aspiration/respiratory distress:  -transfer to MICU for acute management    LOS (Days) 8 A FACE TO FACE EVALUATION WAS PERFORMED  SWARTZ,ZACHARY T 12-Nov-2012 6:58 AM

## 2012-10-25 NOTE — Progress Notes (Signed)
Pt's UOP minimal, 50cc total since 0800. Dr. Vassie Loll notified. Order for NS bolus.

## 2012-10-25 NOTE — Discharge Summary (Signed)
NAMEROY, TOKARZ NO.:  [id number]  MEDICAL RECORD NO.:  [id number]  LOCATION:  4028                         FACILITY:  [facility]  PHYSICIAN:  Ranelle Oyster, M.D.DATE OF BIRTH:  Jun 20, 1924  DATE OF ADMISSION:  10/17/2012 DATE OF DISCHARGE:  Nov 24, 2012                              DISCHARGE SUMMARY   DISCHARGE DIAGNOSES: 1. Right cerebellar infarction. 2. Xarelto for deep vein thrombosis prophylaxis. 3. Severe dysphagia, status post gastrostomy tube October 15, 2012. 4. Aspiration secondary to dysphagia. 5. Hypothyroidism. 6. Atrial fibrillation. 7. Diabetes mellitus with peripheral neuropathy.  An 79 year old right-handed male with history of diabetes mellitus as well as atrial fibrillation, off Coumadin due to diverticulitis with GI bleed, December 2013, who was admitted October 10, 2012, with right-sided weakness and facial droop.  He was initially taken to [facility].  Cranial CT scan negative.  He was transferred to [facility] for further management, did receive t-PA while at [facility].  MRI of the brain showed small acute nonhemorrhagic infarct inferior aspect of the right cerebellum and right posterior lateral aspect of the medulla.  Carotid Dopplers with no ICA stenosis. Cardiology followup for atrial fibrillation.  Echocardiogram with ejection fraction of 65%.  Placed on Xarelto for atrial fibrillation as well as stroke prophylaxis.  The patient on October 07, 2012, with increased confusion as well as swallowing difficulties.  MRI of the brain completed showing there had been extension of prior infarct with new small infarcts anterior right cerebellum and posterior right cerebellum.  Advised to continue current regimen.  The patient with bouts of shortness of breath.  Chest x-ray with no acute changes. Arterial blood gases initially consistent with hypoxia and chronic retention discussed with Pulmonary Services initially  placed on CPAP for questionable sleep apnea.  Followup speech therapy findings of severe oropharyngeal dysphagia as well as dysarthria.  Swallow study advised the patient to be n.p.o. with initially a nasogastric tube placed. There was concern for possible need for gastrostomy tube.  There was findings of esophageal candidiasis and placed on Diflucan x14 days.  The patient was admitted to inpatient rehab services October 09, 2012, with slow progress.  He continued to have issues in regards to handling his secretions as well as decreases in his oxygen saturations.  Again, Pulmonary Services followed up.  His CPAP was discontinued.  It was felt his respiratory issues were a result of severe dysphagia and secretion management.  He was discharged to acute care services October 11, 2012, for plans for gastrostomy tube, as well as possible need for tracheostomy.  He did somewhat better on acute care services.  His gastrostomy tube was completed.  However, hole in tracheostomy at this time.  He was readmitted back to inpatient rehab services October 17, 2012, for ongoing care.  PAST MEDICAL HISTORY:  See discharge diagnoses.  SOCIAL HISTORY:  Lives with family.  FUNCTIONAL HISTORY:  Prior to hospital admission was independent.  FUNCTIONAL STATUS:  Upon admission to rehab services was +2 total assist, ambulate 10 feet with 2 person handheld assistance.  PHYSICAL EXAMINATION:  VITAL SIGNS:  Blood pressure 169/88, pulse 98, temperature 97.8, respirations 20. GENERAL:  This was an alert  male.  He was dysarthric, followed basic commands. LUNGS:  Decreased breath sounds.  Gastrostomy tube in place. CARDIAC:  Rate controlled.  REHABILITATION HOSPITAL COURSE:  The patient was admitted to inpatient rehab services with therapies initiated on a 3-hour daily basis consisting of physical therapy, occupational therapy, speech therapy, and rehabilitation nursing.  The following issues were addressed  during the patient's rehabilitation stay.  Pertaining to Mr. Faron's thrombotic inferior right cerebellum and posterior lateral medullary infarcts, he remained stable, maintained on Xarelto.  Blood pressure is well controlled.  Hospital rehab course was slow steady progress. Gastrostomy tube in place October 15, 2012, for severe dysphagia and remained n.p.o.  Bolus tube feeds were attempted noted on the morning of October 25, 2012, the patient with coughing, gagging, vomiting thick mucus.  Chest x-ray obtained showing no acute changes at this time. Arterial blood gases by rapid response with pO2 96% with face mask.  The patient in respiratory distress.  Pulmonary Services was consulted per rapid response suspect aspiration.  He was transferred to acute care services in guarded condition with plan for intubation.  All medication changes were made as per Critical Care Services.     Mariam Dollar, P.A.   ______________________________ Ranelle Oyster, M.D.    DA/MEDQ  D:  09/29/2012  T:  10/23/2012  Job:  [id number]  cc:   Ranelle Oyster, M.D.

## 2012-10-25 NOTE — Progress Notes (Signed)
Physical Therapy Note  Patient Details  Name: Joe Snow MRN: [id number] Date of Birth: 03-Aug-1923 Today's Date: 2012/10/26  Pt missed 60 mins skilled OT services secondary to medical complications.   Lavone Neri [facility] 2012/10/26, 7:17 AM

## 2012-10-25 NOTE — Significant Event (Signed)
Rapid Response Event Note  Overview: Time Called: 0628 Arrival Time: 0628 Event Type: Respiratory  Initial Focused Assessment: Respiratory distress after several vomiting episodes. Color was dusky with decrease responsiveness.   Interventions: We were called earlier this morning around 1:40 due to possible aspiration after vomiting episodes. Patient was orally suctioned several times to clear airway. On-call physician was made aware and orders received for CXR and ABG. ABG and CXR results were noted and called to on-call physician by unit RN. Patient received a HHN and respiratory status improved. About 06:25, unit RN called about patient having tremors and not looking well. On arrival, patient appeared dusky with a decrease loc. Due to tremors, O2 sats were difficult to obtain. O2 sats to lower extremity showed 72%. Patient was placed on a NRB mask. PA on the unit was made aware of patient condition. CCM was made aware of situation by author. It was requested that patient be moved to the ICU for intubation. Dr. Kendrick Fries was called to bedside. Patient was transported to 2110 and bedside report was given by unit RN. Patient was administered RSI medication by unit RN and intubated by Dr. Kendrick Fries without difficultly.   Event Summary: Name of Physician Notified: Maggie Schwalbe, PA at 0630  Name of Consulting Physician Notified: Dr. Tyson Alias at [phone]  Outcome: Transferred (Comment)  Event End Time: 9604  Brynda Greathouse

## 2012-10-25 NOTE — H&P (Signed)
PULMONARY  / CRITICAL CARE MEDICINE  Name: Joe Snow MRN: [id number] DOB: 04-10-1924    ADMISSION DATE:  11/08/2012   REFERRING MD :  Rehab  CHIEF COMPLAINT:  resp distress  BRIEF PATIENT DESCRIPTION: 79 y.o. Man with  history of hypertension, diabetes mellitus and atrial fibrillation , diverticulitis with GI bleeding December 2013. Initially admitted 09/30/2012 with right-sided weakness and facial droop.  to [place] [facility] , given TPA. MRI 3/4 showed small acute non hemorrhagic infarcts inferior aspect of  the right cerebellum and right  medulla.  Carotid Dopplers with no ICA stenosis. Echocardiogram showed EF  65% . He had swallowing difficulties & rpt  MRI showed  extension of the prior infarct . Placed on CPAP for  sleep apnea , PEG placed for dysphagia.  Transferred to rehab 10/09/2012  - some discussion about Tstomy due to secretions but his was deferred. Admitted to 2100 on 3/28 after an episode of witnessed aspiration/ vomiting in rehab with resp distress requiring emergent mechanical ventilation.  SIGNIFICANT EVENTS / STUDIES:    LINES / TUBES: ETT 3/ 28 >> RIJ 3/28 >>  CULTURES: resp 3/28 >> bld 3/28 >> Urine 3/28 >>  ANTIBIOTICS: Zosyn 3/28 >> vanc 3/28 >>  HISTORY OF PRESENT ILLNESS:  79 y.o. Man with  history of hypertension, diabetes mellitus and atrial fibrillation , diverticulitis with GI bleeding December 2013. Initially admitted 09/30/2012 with right-sided weakness and facial droop.  to [facility] , given TPA. MRI 3/4 showed small acute non hemorrhagic infarcts inferior aspect of  the right cerebellum and right  medulla.  Carotid Dopplers with no ICA stenosis. Echocardiogram showed EF  65% . He had swallowing difficulties & rpt  MRI showed  extension of the prior infarct . Placed on CPAP for  sleep apnea , PEG placed for dysphagia.  Transferred to rehab 10/09/2012  - some discussion about Tstomy due to secretions but his was deferred. Admitted  to 2100 on 3/28 after an episode of witnessed aspiration/ vomiting in rehab with resp distress requiring emergent mechanical ventilation.  PAST MEDICAL HISTORY :  Past Medical History  Diagnosis Date  . Hypertension associated with diabetes   . Diabetes   . Atrial fibrillation   . Embolic stroke involving left vertebral artery 10/02/2012  . Dysphagia, pharyngeal-neurogenic 10/05/2012  . Dysrhythmia     Conjenatle A-Fib   Past Surgical History  Procedure Laterality Date  . Prostate surgery N/A 1995    estimate  . Tonsillectomy Bilateral 1930s  . Esophagogastroduodenoscopy N/A 10/05/2012    Procedure: ESOPHAGOGASTRODUODENOSCOPY (EGD);  Surgeon: Iva Boop, MD;  Location: [facility] ENDOSCOPY;  Service: Endoscopy;  Laterality: N/A;  . Peg placement N/A 10/15/2012    Procedure: PERCUTANEOUS ENDOSCOPIC GASTROSTOMY (PEG) PLACEMENT;  Surgeon: Beverley Fiedler, MD;  Location: [facility] ENDOSCOPY;  Service: Gastroenterology;  Laterality: N/A;   Prior to Admission medications   Medication Sig Start Date End Date Taking? Authorizing Provider  feeding supplement (GLUCERNA SHAKE) LIQD Take 237 mLs by mouth 2 (two) times daily between meals. 10/04/12   Layne Benton, NP  insulin detemir (LEVEMIR) 100 UNIT/ML injection Inject 20-25 Units into the skin 2 (two) times daily.    Historical Provider, MD  levothyroxine (SYNTHROID, LEVOTHROID) 88 MCG tablet Place 1 tablet (88 mcg total) into feeding tube daily before breakfast. 10/17/12   Kathlen Mody, MD  lisinopril (PRINIVIL,ZESTRIL) 5 MG tablet Place 1 tablet (5 mg total) into feeding tube daily. 10/17/12   Kathlen Mody, MD  lovastatin (MEVACOR) 10  MG tablet Place 1 tablet (10 mg total) into feeding tube daily. 10/17/12   Kathlen Mody, MD  montelukast (SINGULAIR) 10 MG tablet Place 1 tablet (10 mg total) into feeding tube daily. 10/17/12   Kathlen Mody, MD  Nutritional Supplements (FEEDING SUPPLEMENT, GLUCERNA 1.2 CAL,) LIQD Place 1,000 mLs into feeding tube continuous. 10/17/12    Kathlen Mody, MD  pioglitazone (ACTOS) 30 MG tablet Place 1 tablet (30 mg total) into feeding tube daily. 10/17/12   Kathlen Mody, MD  prednisoLONE acetate (PRED FORTE) 1 % ophthalmic suspension Place 1 drop into the left eye daily as needed (flares).    Historical Provider, MD  Rivaroxaban (XARELTO) 15 MG TABS tablet Take 1 tablet (15 mg total) by mouth daily with supper. 10/17/12   Kathlen Mody, MD  sertraline (ZOLOFT) 25 MG tablet Place 1 tablet (25 mg total) into feeding tube daily. 10/17/12   Kathlen Mody, MD  trifluridine (VIROPTIC) 1 % ophthalmic solution Place 1 drop into the left eye daily as needed (flares).    Historical Provider, MD  Water For Irrigation, Sterile (FREE WATER) SOLN Place 200 mLs into feeding tube every 4 (four) hours. 10/17/12   Kathlen Mody, MD   Allergies  Allergen Reactions  . Metoprolol Shortness Of Breath  . Amlodipine Other (See Comments)    Lethargy     FAMILY HISTORY:  No family history on file. SOCIAL HISTORY:  reports that he quit smoking about 34 years ago. His smoking use included Cigarettes. He smoked 0.00 packs per day. He has never used smokeless tobacco. He reports that he drinks about 1.0 ounces of alcohol per week. He reports that he does not use illicit drugs.  REVIEW OF SYSTEMS:  Unable to obtain since intubated  SUBJECTIVE:   VITAL SIGNS: Temp:  [97.5 F (36.4 C)-101.7 F (38.7 C)] 101.7 F (38.7 C) (03/28 0630) Pulse Rate:  [80-125] 125 (03/28 0630) Resp:  [20] 20 (03/28 0340) BP: (90-101)/(56-62) 101/56 mmHg (03/28 0340) SpO2:  [76 %-97 %] 76 % (03/28 0630) HEMODYNAMICS:   VENTILATOR SETTINGS:   INTAKE / OUTPUT: Intake/Output   None     PHYSICAL EXAMINATION: Gen.chronically ill, poorly-nourished, in no distress,intubated ,s edated ENT - no lesions, no post nasal drip, ETT Neck: No JVD, no thyromegaly, no carotid bruits Lungs: no use of accessory muscles, no dullness to percussion, Lt  Rales, coarsehi  Cardiovascular: Rhythm  regular, heart sounds  normal, no murmurs, no peripheral edema Abdomen: soft and non-tender, no hepatosplenomegaly, BS normal. Musculoskeletal: No deformities, no cyanosis or clubbing Neuro:  alert, non focal Skin:  Warm, no lesions/ rash   LABS:  Recent Labs Lab 10/23/12 0530 10/19/2012 0151 10/11/2012 0605  HGB 13.2  --  14.6  WBC 8.3  --  12.5*  PLT 172  --  204  NA 139  --  142  K 3.9  --  4.4  CL 101  --  99  CO2 31  --  27  GLUCOSE 176*  --  219*  BUN 25*  --  35*  CREATININE 1.41*  --  1.73*  CALCIUM 9.3  --  9.5  PHART  --  7.351  --   PCO2ART  --  55.0*  --   PO2ART  --  96.9  --     Recent Labs Lab 10/24/12 1145 10/24/12 1633 10/24/12 2217 10/14/2012 0132 10/10/2012 0541  GLUCAP 190* 100* 141* 243* 160*    CXR: ETT/ RIJ in position, Lt ASD   ASSESSMENT /  PLAN:  PULMONARY A:Acute resp failure Aspiration pneumonia P:   PRVC ventialtion Vent bundle May need Tstomy eventually if we are to press forward  CARDIOVASCULAR A: Septic shock Echo - nml LV fn A fibn P:  Septic shock protocol - appears dry , will resuscitate volume RIJ placed  RENAL A:  Acute renal failure on CKD - baseline cr 1.4 range P:   Avoid nephrotoxins  GASTROINTESTINAL A:  Dysphagia s/p PEG P:   Npo x 24h, then resume Tfs PEG to LIs   HEMATOLOGIC A:  High risk DVT P:  Hold xarelto in anticipation of procedures Sq lovenox  INFECTIOUS A:  Aspiration, nosocomial   P:   Pan cx Zosyn/ vanc empiric  ENDOCRINE A:  DM-2   P:   SSI  NEUROLOGIC A:  Recent CVAs, presumed embolic- although carotids neg, echo neg P:   Doubt he is a candidate for long term anticoagulation Re assess rehab potential after trach  TODAY'S SUMMARY: Aspiration, LLL pna & shock requiring intubation   I have personally obtained a history, examined the patient, evaluated laboratory and imaging results, formulated the assessment and plan and placed orders. CRITICAL CARE: The patient is  critically ill with multiple organ systems failure and requires high complexity decision making for assessment and support, frequent evaluation and titration of therapies, application of advanced monitoring technologies and extensive interpretation of multiple databases. Critical Care Time devoted to patient care services described in this note is 60 minutes.   Oretha Milch  Pulmonary and Critical Care Medicine [facility] Pager: [phone]  03-Nov-2012, 7:51 AM

## 2012-10-25 NOTE — Progress Notes (Signed)
RT called to unit 4000 where Rapid Response and Dr. Kendrick Fries at bedside. Pt was on 100% NRB and Spo2 was 92%. Since pt stable, MD wanted to bring pt down to intubate in ICU. Pt was intubated and placed on vent with current settings per MD order. RT will continue to monitor.

## 2012-10-25 NOTE — Discharge Summary (Signed)
  Discharge summary job # [id number]

## 2012-10-25 NOTE — Procedures (Signed)
Intubation Procedure Note Joe Snow [id number] 02/10/1924  Procedure: Intubation Indications: Airway protection and maintenance  Procedure Details Consent: Risks of procedure as well as the alternatives and risks of each were explained to the (patient/caregiver).  Consent for procedure obtained. Time Out: Verified patient identification, verified procedure, site/side was marked, verified correct patient position, special equipment/implants available, medications/allergies/relevent history reviewed, required imaging and test results available.  Performed  Drugs Etomidate 20mg  IV, Rocuronium 70mg  IV DL x 1 with MAC 3 blade Grade 2 view 8-0 tube passed through cords under direct visualization Placement confirmed with bilateral breath sounds, positive EtCO2 change and smoke in tube   Evaluation Hemodynamic Status: Persistent hypotension treated with pressors and fluid; O2 sats: stable throughout Patient's Current Condition: stable Complications: No apparent complications Patient did tolerate procedure well. Chest X-ray ordered to verify placement.  CXR: pending.   Joe Snow 09/29/2012

## 2012-10-25 NOTE — Procedures (Signed)
Central Venous Catheter Insertion Procedure Note Joe Snow [id number] 08/01/1923  Procedure: Insertion of Central Venous Catheter Indications: Assessment of intravascular volume, Drug and/or fluid administration and Frequent blood sampling  Procedure Details Consent: Risks of procedure as well as the alternatives and risks of each were explained to the (patient/caregiver).  Consent for procedure obtained. Time Out: Verified patient identification, verified procedure, site/side was marked, verified correct patient position, special equipment/implants available, medications/allergies/relevent history reviewed, required imaging and test results available.  Performed  Maximum sterile technique was used including antiseptics, cap, gloves, gown, hand hygiene, mask and sheet. Skin prep: Chlorhexidine; local anesthetic administered A antimicrobial bonded/coated single lumen catheter was placed in the right internal jugular vein using the Seldinger technique.  Ultrasound was used to verify the patency of the vein and for real time needle guidance.  Evaluation Blood flow good Complications: No apparent complications Patient did tolerate procedure well. Chest X-ray ordered to verify placement.  CXR: pending.  MCQUAID, DOUGLAS 10/07/2012, 7:41 AM

## 2012-10-25 NOTE — Progress Notes (Signed)
At 0130 am nurse called to room with patient coughing, gagging and vomiting thick mucous tan substance. Vital signs T 97.5, P40, Bp 90/62, R 24. O2 sat 97%. cbg 243.Called rapid response nurse to assist with patient.Notified Delle Reining, PA orders given for stat portable chest xray , ABG, EKG, CBC with diff in am, decrease water flushes to 150 ml, and keep patient head of bed in a sitting position/recliner. 0200 am vital signs BP 110/60 P 55, R 21.At 0220 respiratory therapist administered breathing treatment. Patient resting comfortably. Will continue to monitor patients status.

## 2012-10-25 NOTE — Progress Notes (Signed)
ANTIBIOTIC CONSULT NOTE - INITIAL  Pharmacy Consult for Vancomycin, Zosyn Indication: Nosocomial/aspiration PNA  Allergies  Allergen Reactions  . Metoprolol Shortness Of Breath  . Amlodipine Other (See Comments)    Lethargy     Patient Measurements: Height: 5' 11.5" (181.6 cm) Weight: 183 lb (83.008 kg) IBW/kg (Calculated) : 76.45  Vital Signs: Temp: 101.7 F (38.7 C) (03/28 0630) Temp src: Oral (03/28 0135) BP: 69/34 mmHg (03/28 0715) Pulse Rate: 146 (03/28 0715) Intake/Output from previous day:   Intake/Output from this shift:    Labs:  Recent Labs  10/23/12 0530 2012/11/08 0605  WBC 8.3 12.5*  HGB 13.2 14.6  PLT 172 204  CREATININE 1.41* 1.73*   Medical History: Past Medical History  Diagnosis Date  . Hypertension associated with diabetes   . Diabetes   . Atrial fibrillation   . Embolic stroke involving left vertebral artery 10/02/2012  . Dysphagia, pharyngeal-neurogenic 10/05/2012  . Dysrhythmia     Conjenatle A-Fib    Medications:  Prescriptions prior to admission  Medication Sig Dispense Refill  . feeding supplement (GLUCERNA SHAKE) LIQD Take 237 mLs by mouth 2 (two) times daily between meals.  60 Can  2  . insulin detemir (LEVEMIR) 100 UNIT/ML injection Inject 20-25 Units into the skin 2 (two) times daily.      Marland Kitchen levothyroxine (SYNTHROID, LEVOTHROID) 88 MCG tablet Place 1 tablet (88 mcg total) into feeding tube daily before breakfast.      . lisinopril (PRINIVIL,ZESTRIL) 5 MG tablet Place 1 tablet (5 mg total) into feeding tube daily.  30 tablet  2  . lovastatin (MEVACOR) 10 MG tablet Place 1 tablet (10 mg total) into feeding tube daily.      . montelukast (SINGULAIR) 10 MG tablet Place 1 tablet (10 mg total) into feeding tube daily.      . Nutritional Supplements (FEEDING SUPPLEMENT, GLUCERNA 1.2 CAL,) LIQD Place 1,000 mLs into feeding tube continuous.      . pioglitazone (ACTOS) 30 MG tablet Place 1 tablet (30 mg total) into feeding tube daily.      .  prednisoLONE acetate (PRED FORTE) 1 % ophthalmic suspension Place 1 drop into the left eye daily as needed (flares).      . Rivaroxaban (XARELTO) 15 MG TABS tablet Take 1 tablet (15 mg total) by mouth daily with supper.  30 tablet  2  . sertraline (ZOLOFT) 25 MG tablet Place 1 tablet (25 mg total) into feeding tube daily.      Marland Kitchen trifluridine (VIROPTIC) 1 % ophthalmic solution Place 1 drop into the left eye daily as needed (flares).      . Water For Irrigation, Sterile (FREE WATER) SOLN Place 200 mLs into feeding tube every 4 (four) hours.       Assessment: Joe Snow is an 79 yo who was in CIR (since 3/12) until this morning when he had an episode of aspiration/vomiting and respiratory distress requiring intubation/ICU admission. Noted he recently suffered a CVA, 3/3 and received TPA at [facility]. He was on CPAP for sleep apnea, and PEG placed for dysphagia. Patient is febrile, has leukocytosis. No prior antibiotic administration noted (this morning/recently at [facility]). Scr is slightly elevated at baseline, noted hx of CKD with baseline Scr ~ 1.4  3/28: Vanc>> 3/28: Zosyn>>  3/28: Blood: 3/28: Urine: 3/28: MRSA pcr:    Goal of Therapy:  Vancomycin trough level 15-20 mcg/ml  Plan:  - Zosyn 3.375 mg IV now over 30 minutes, followed by Zosyn 3.375gm IV Q8h,  each dose infused over 4 hours, starting at 1600 today - Vancomycin 1750mg  IV now, followed by 1gm IV q24h starting 0800 on 3/29 - Will monitor cx/spec/sens, renal fn and clinical status daily.   Thanks, Tafari Humiston K. Allena Katz, PharmD, BCPS.  Clinical Pharmacist Pager [phone]. 10/17/2012 8:29 AM

## 2012-10-25 NOTE — Progress Notes (Signed)
INITIAL NUTRITION ASSESSMENT  DOCUMENTATION CODES Per approved criteria  -Not Applicable   INTERVENTION: 1. Recommend initiation of enteral nutrition within 24 - 48 hours of intubation; recommend Glucerna 1.2 at 20 mL/hr via PEG and advance by 10 mL every 4 hours to reach goal rate of 70 mL/hr. 30 mL Pro-Stat BID. This TF regimen will provide 2216 kcal, 131 grams of protein, and 1352 mL free water. 2. RD to continue to follow nutrition care plan.  NUTRITION DIAGNOSIS: Inadequate oral intake related to inability to eat as evidenced by NPO status.   Goal: TF regimen to provide >/=90% estimated nutrition needs  Monitor:  Vent status, TF initiation, weight trends, I/O's, labs  Reason for Assessment: Ventilated Patient  79 y.o. male  Admitting Dx: <principal problem not specified>  ASSESSMENT: Pt previously followed while at inpatient rehab. His TF regimen prior to transfer to the ICU was Glucerna 1.2 at 80 mL/hr x20 hours daily which was then transitioned to 120 mL Glucerna 1.2 via tube 5 times daily.  Per rounds, pt with sepsis likely due to aspiration who is a transfer from Rehab.   Pt experienced respiratory distress after vomiting several times and was intubated this morning. Pt with hx of DM and diverticulitis with GI bleeding. Initially admitted to [facility] on 3/3 for right-sided weakness and facial droop, MRI showed small acute non-hemorrhagic infarcts.   When TF becomes medically appropriate, recommend Glucerna 1.2 at 20 mL/hr and advance by 10 mL every 4 hours to reach goal rate of 70 mL/hr. 30 mL Pro-Stat BID. At goal rate, this TF regimen will provide 2216 kcal, 131 grams of protein, and 1352 mL free water. This will provide 98% estimated kcal needs and 100% estimated protein needs.   Pt is at nutrition risk given declining weights and complex medical issues.  Height: Ht Readings from Last 1 Encounters:  11/16/2012 5' 11.5" (1.816 m)    Weight: Wt Readings from  Last 1 Encounters:  Nov 16, 2012 183 lb (83.008 kg)    Ideal Body Weight: 175 lbs  % Ideal Body Weight: 105%  Wt Readings from Last 10 Encounters:  2012-11-16 183 lb (83.008 kg)  10/23/12 182 lb 8.7 oz (82.8 kg)  10/17/12 185 lb 14.4 oz (84.324 kg)  10/17/12 185 lb 14.4 oz (84.324 kg)  10/11/12 204 lb 2.3 oz (92.6 kg)  09/30/12 197 lb 8.5 oz (89.6 kg)  09/30/12 197 lb 8.5 oz (89.6 kg)    Usual Body Weight: 197 lbs  % Usual Body Weight: 93%  BMI:  Body mass index is 25.17 kg/(m^2).--Overweight   Tmax: 39.4; MVe: 13.4   Estimated Nutritional Needs: Kcal: 2267 Protein: 125-135 grams Fluid: >/=2.2 L/day  Skin: intact, no wounds noted  Diet Order: NPO  EDUCATION NEEDS: -No education needs identified at this time  No intake or output data in the 24 hours ending 2012-11-16 1042  Last BM: 3/27  Labs:   Recent Labs Lab 10/23/12 0530 16-Nov-2012 0605  NA 139 142  K 3.9 4.4  CL 101 99  CO2 31 27  BUN 25* 35*  CREATININE 1.41* 1.73*  CALCIUM 9.3 9.5  GLUCOSE 176* 219*    CBG (last 3)   Recent Labs  November 16, 2012 0132 November 16, 2012 0541 November 16, 2012 0823  GLUCAP 243* 160* 195*    Scheduled Meds: . antiseptic oral rinse  15 mL Mouth Rinse QID  . chlorhexidine  15 mL Mouth Rinse BID  . enoxaparin (LOVENOX) injection  30 mg Subcutaneous Q24H  . famotidine (  PEPCID) IV  20 mg Intravenous Q12H  . insulin aspart  0-15 Units Subcutaneous Q4H  . pantoprazole (PROTONIX) IV  40 mg Intravenous QHS  . piperacillin-tazobactam (ZOSYN)  IV  3.375 g Intravenous Q8H  . vancomycin  1,750 mg Intravenous Once  . [START ON 10/26/2012] vancomycin  1,000 mg Intravenous Q24H    Continuous Infusions: . sodium chloride 100 mL/hr at 10/03/2012 0815  . fentaNYL infusion INTRAVENOUS 100 mcg/hr (10/08/2012 1001)  . norepinephrine (LEVOPHED) Adult infusion 15 mcg/min (10/24/2012 0854)  . vasopressin (PITRESSIN) infusion - *FOR SHOCK* 0.03 Units/min (10/24/2012 1000)    Past Medical History  Diagnosis  Date  . Hypertension associated with diabetes   . Diabetes   . Atrial fibrillation   . Embolic stroke involving left vertebral artery 10/02/2012  . Dysphagia, pharyngeal-neurogenic 10/05/2012  . Dysrhythmia     Conjenatle A-Fib    Past Surgical History  Procedure Laterality Date  . Prostate surgery N/A 1995    estimate  . Tonsillectomy Bilateral 1930s  . Esophagogastroduodenoscopy N/A 10/05/2012    Procedure: ESOPHAGOGASTRODUODENOSCOPY (EGD);  Surgeon: Iva Boop, MD;  Location: [facility] ENDOSCOPY;  Service: Endoscopy;  Laterality: N/A;  . Peg placement N/A 10/15/2012    Procedure: PERCUTANEOUS ENDOSCOPIC GASTROSTOMY (PEG) PLACEMENT;  Surgeon: Beverley Fiedler, MD;  Location: [facility] ENDOSCOPY;  Service: Gastroenterology;  Laterality: N/A;    Trenton Gammon Dietetic Intern # [phone]  I agree with the above information. Jarold Motto MS, RD, LDN Pager: [phone] After-hours pager: [phone]

## 2012-10-25 NOTE — Progress Notes (Signed)
Spoke with Dr. Vassie Loll inc Rate to 24, Vt 550 ABG to follow in 2 hours

## 2012-10-25 NOTE — Progress Notes (Signed)
eLink Physician-Brief Progress Note Patient Name: Joe Snow DOB: [birth year] MRN: [id number]  Date of Service  10/15/2012   HPI/Events of Note  Call from nurse to camera in and observe patient over concern for seizure activity.  Patient at end of event but eyes deviated upward with occasional blinking.  Sats are dropped and in the low 90s but had been in the 80s earlier.  Otherwise HD stable.  Nurse reports abnormal movement earlier as well.   eICU Interventions  Plan: 2 mg ativan IV for suspected seizure activity If event occurs again will start Keppra Consider neuro evaluation in AM   Intervention Category Major Interventions: Seizures - evaluation and management  DETERDING,ELIZABETH 10/06/2012, 11:49 PM

## 2012-10-25 NOTE — Progress Notes (Signed)
Pt having a-flutter. Rate controlled. Dr. Vassie Loll aware.

## 2012-10-26 DIAGNOSIS — E119 Type 2 diabetes mellitus without complications: Secondary | ICD-10-CM

## 2012-10-26 DIAGNOSIS — J441 Chronic obstructive pulmonary disease with (acute) exacerbation: Secondary | ICD-10-CM

## 2012-10-26 DIAGNOSIS — R1313 Dysphagia, pharyngeal phase: Secondary | ICD-10-CM

## 2012-10-26 LAB — GLUCOSE, CAPILLARY
Glucose-Capillary: 156 mg/dL — ABNORMAL HIGH (ref 70–99)
Glucose-Capillary: 162 mg/dL — ABNORMAL HIGH (ref 70–99)
Glucose-Capillary: 182 mg/dL — ABNORMAL HIGH (ref 70–99)

## 2012-10-26 LAB — CBC
HCT: 36.6 % — ABNORMAL LOW (ref 39.0–52.0)
Hemoglobin: 11.9 g/dL — ABNORMAL LOW (ref 13.0–17.0)
MCH: 27 pg (ref 26.0–34.0)
MCHC: 32.5 g/dL (ref 30.0–36.0)
RDW: 17.1 % — ABNORMAL HIGH (ref 11.5–15.5)

## 2012-10-26 LAB — BASIC METABOLIC PANEL
BUN: 48 mg/dL — ABNORMAL HIGH (ref 6–23)
Calcium: 8.8 mg/dL (ref 8.4–10.5)
GFR calc non Af Amer: 21 mL/min — ABNORMAL LOW (ref >90)
Glucose, Bld: 175 mg/dL — ABNORMAL HIGH (ref 70–99)
Sodium: 139 mEq/L (ref 135–145)

## 2012-10-26 LAB — BLOOD GAS, ARTERIAL
Bicarbonate: 21 mEq/L (ref 20.0–24.0)
PEEP: 5 cmH2O
pH, Arterial: 7.441 (ref 7.350–7.450)
pO2, Arterial: 81.4 mmHg (ref 80.0–100.0)

## 2012-10-26 LAB — URINE CULTURE

## 2012-10-26 MED ORDER — SODIUM CHLORIDE 0.9 % IV SOLN
1.0000 mg/h | INTRAVENOUS | Status: DC
  Administered 2012-10-26: 2 mg/h via INTRAVENOUS
  Administered 2012-10-26: 1 mg/h via INTRAVENOUS
  Filled 2012-10-26 (×2): qty 10

## 2012-10-26 MED ORDER — GLUCERNA 1.2 CAL PO LIQD
1000.0000 mL | ORAL | Status: DC
  Administered 2012-10-26: 1000 mL
  Filled 2012-10-26 (×3): qty 1000

## 2012-10-26 MED ORDER — DOPAMINE-DEXTROSE 3.2-5 MG/ML-% IV SOLN
2.0000 ug/kg/min | INTRAVENOUS | Status: DC
  Administered 2012-10-26: 5 ug/kg/min via INTRAVENOUS
  Filled 2012-10-26: qty 250

## 2012-10-26 MED ORDER — PRO-STAT SUGAR FREE PO LIQD
60.0000 mL | Freq: Two times a day (BID) | ORAL | Status: DC
  Administered 2012-10-26 – 2012-10-27 (×3): 60 mL
  Filled 2012-10-26 (×4): qty 60

## 2012-10-26 MED ORDER — LEVOTHYROXINE SODIUM 88 MCG PO TABS: 88.0000 ug | ORAL_TABLET | Freq: Every day | ORAL | Status: DC

## 2012-10-26 MED ORDER — LEVOTHYROXINE SODIUM 88 MCG PO TABS
88.0000 ug | ORAL_TABLET | Freq: Every day | ORAL | Status: DC
  Administered 2012-10-26 – 2012-10-27 (×2): 88 ug via ORAL
  Filled 2012-10-26 (×3): qty 1

## 2012-10-26 MED ORDER — FAMOTIDINE IN NACL 20-0.9 MG/50ML-% IV SOLN
20.0000 mg | INTRAVENOUS | Status: DC
  Administered 2012-10-27: 20 mg via INTRAVENOUS
  Filled 2012-10-26: qty 50

## 2012-10-26 MED ORDER — SODIUM CHLORIDE 0.9 % IV SOLN
50.0000 ug/h | INTRAVENOUS | Status: DC
  Administered 2012-10-27: 50 ug/h via INTRAVENOUS
  Filled 2012-10-26: qty 50

## 2012-10-26 NOTE — Progress Notes (Signed)
PULMONARY  / CRITICAL CARE MEDICINE  Name: Joe Snow MRN: [id number] DOB: September 08, 1923    ADMISSION DATE:  12-Nov-2012   REFERRING MD :  Rehab  CHIEF COMPLAINT:  resp distress  BRIEF PATIENT DESCRIPTION: 79 y.o. Man with  history of hypertension, diabetes mellitus and atrial fibrillation , diverticulitis with GI bleeding December 2013. Initially admitted 09/30/2012 with right-sided weakness and facial droop.  to [facility] , given TPA. MRI 3/4 showed small acute non hemorrhagic infarcts inferior aspect of  the right cerebellum and right  medulla.  Carotid Dopplers with no ICA stenosis. Echocardiogram showed EF  65% . He had swallowing difficulties & rpt  MRI showed  extension of the prior infarct . Placed on CPAP for  sleep apnea , PEG placed for dysphagia.  Transferred to rehab 10/09/2012  - some discussion about Tstomy due to secretions but his was deferred. Admitted to 2100 on 3/28 after an episode of witnessed aspiration/ vomiting in rehab with resp distress requiring emergent mechanical ventilation.  SIGNIFICANT EVENTS / STUDIES:    LINES / TUBES: ETT 3/ 28 >> RIJ 3/28 >>  CULTURES: resp 3/28 >> bld 3/28 >> Urine 3/28 >>  ANTIBIOTICS: Zosyn 3/28 >> vanc 3/28 >>  SUBJECTIVE: Agitated and appears angry.  VITAL SIGNS: Temp:  [97.8 F (36.6 C)-99 F (37.2 C)] 98.9 F (37.2 C) (03/29 0800) Pulse Rate:  [49-88] 54 (03/29 0845) Resp:  [18-27] 20 (03/29 0845) BP: (60-202)/(45-142) 140/81 mmHg (03/29 0800) SpO2:  [94 %-100 %] 100 % (03/29 0845) FiO2 (%):  [29.8 %-100 %] 30 % (03/29 0845) Weight:  [87.1 kg (192 lb 0.3 oz)] 87.1 kg (192 lb 0.3 oz) (03/29 0447) HEMODYNAMICS: CVP:  [7 mmHg-23 mmHg] 8 mmHg VENTILATOR SETTINGS: Vent Mode:  [-] PRVC FiO2 (%):  [29.8 %-100 %] 30 % Set Rate:  [20 bmp-24 bmp] 20 bmp Vt Set:  [550 mL] 550 mL PEEP:  [5 cmH20-5.3 cmH20] 5 cmH20 Plateau Pressure:  [15 cmH20-20 cmH20] 17 cmH20 INTAKE / OUTPUT: Intake/Output     03/28  0701 - 03/29 0700 03/29 0701 - 03/30 0700   I.V. (mL/kg) 1909 (21.9) 31.2 (0.4)   IV Piggyback 3650 212.5   Total Intake(mL/kg) 5559 (63.8) 243.7 (2.8)   Urine (mL/kg/hr) 220 48 (0.2)   Drains 50    Total Output 270 48   Net +5289 +195.7          PHYSICAL EXAMINATION: Gen.chronically ill, poorly-nourished, in no distress,intubated, sedated ENT - no lesions, no post nasal drip, ETT Neck: No JVD, no thyromegaly, no carotid bruits Lungs: no use of accessory muscles, no dullness to percussion, Lt  Rales, coarsehi  Cardiovascular: Rhythm regular, heart sounds  normal, no murmurs, no peripheral edema Abdomen: soft and non-tender, no hepatosplenomegaly, BS normal. Musculoskeletal: No deformities, no cyanosis or clubbing Neuro:  alert, non focal Skin:  Warm, no lesions/ rash  LABS:  Recent Labs Lab 10/23/12 0530 11-12-2012 0151 Nov 12, 2012 0605 11-12-2012 0820  Nov 12, 2012 1148 12-Nov-2012 1400 2012-11-12 1408 11-12-12 1950 Nov 12, 2012 2300 2012/11/12 2304 10/26/12 0355 10/26/12 0500  HGB 13.2  --  14.6  --   --   --   --   --   --   --   --   --  11.9*  WBC 8.3  --  12.5*  --   --   --   --   --   --   --   --   --  22.5*  PLT 172  --  204  --   --   --   --   --   --   --   --   --  143*  NA 139  --  142  --   --   --  140  --   --   --   --   --  139  K 3.9  --  4.4  --   --   --  4.5  --   --   --   --   --  4.9  CL 101  --  99  --   --   --  105  --   --   --   --   --  105  CO2 31  --  27  --   --   --  24  --   --   --   --   --  21  GLUCOSE 176*  --  219*  --   --   --  193*  --   --   --   --   --  175*  BUN 25*  --  35*  --   --   --  38*  --   --   --   --   --  48*  CREATININE 1.41*  --  1.73*  --   --   --  2.21*  --   --   --   --   --  2.62*  CALCIUM 9.3  --  9.5  --   --   --  8.0*  --   --   --   --   --  8.8  LATICACIDVEN  --   --   --  2.1  --   --   --   --   --  2.9*  --   --   --   TROPONINI  --   --   --  <0.30  --   --   --  <0.30 <0.30  --   --   --   --   PHART  --   7.351  --   --   < > 7.290*  --   --   --   --  7.466* 7.441  --   PCO2ART  --  55.0*  --   --   < > 53.2*  --   --   --   --  28.9* 31.3*  --   PO2ART  --  96.9  --   --   < > 256.0*  --   --   --   --  71.4* 81.4  --   < > = values in this interval not displayed.  Recent Labs Lab 10/05/2012 1555 10/05/2012 2032 10/26/12 0001 10/26/12 0416 10/26/12 0728  GLUCAP 160* 141* 156* 162* 151*   CXR: ETT/ RIJ in position, Lt ASD  ASSESSMENT / PLAN:  PULMONARY A:Acute resp failure Aspiration pneumonia P:   - Begin PS trials. - Vent bundle. - May need Tstomy eventually if we are to press forward but need to discuss that with family prior decision is made.  CARDIOVASCULAR A: Septic shock Echo - nml LV fn A fibn P:  - Septic shock protocol - appears dry , will resuscitate volume. - RIJ placed. - Follow CVP. - KVO IVF.  RENAL A:  Acute renal failure on CKD - baseline cr 1.4 range.  Likely ATN. P:   -  Avoid nephrotoxins - Replace electrolytes as needed. - Hoping will improve as BP improves.  GASTROINTESTINAL A:  Dysphagia s/p PEG P:   - Consult nutrition for TF.  HEMATOLOGIC A:  High risk DVT P:  - Hold xarelto in anticipation of procedures. - Sq lovenox.  INFECTIOUS A:  Aspiration, nosocomial P:   - Pan cx. - Zosyn/ vanc empiric.  ENDOCRINE A:  DM-2   P:   - SSI.  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 - Reassess rehab potential after trach if that is the family's wishes.  TODAY'S SUMMARY: Aspiration, LLL pna & shock requiring intubation   I have personally obtained a history, examined the patient, evaluated laboratory and imaging results, formulated the assessment and plan and placed orders.  CRITICAL CARE: The patient is critically ill with multiple organ systems failure and requires high complexity decision making for assessment and  support, frequent evaluation and titration of therapies, application of advanced monitoring technologies and extensive interpretation of multiple databases. Critical Care Time devoted to patient care services described in this note is 35 minutes.   Koren Bound Pulmonary and Critical Care Medicine [facility] Pager: [phone]  10/26/2012, 9:33 AM

## 2012-10-26 NOTE — Progress Notes (Signed)
Spoke with the family extensively, after discussing the entire case, decided to make patient a LCB, no CPR, no cardioversion, no tracheostomy and no dialysis.  If able to extubate then will make a full NCB with hospice consult and goal will be to send patient home with home hospice.  The focus of the family is quality of life and dignity of the patient rather than prolongation of his hospitalization and suffering.  Additional CC time of 45 min.  Alyson Reedy, M.D. [facility] Pulmonary/Critical Care Medicine. Pager: [phone]. After hours pager: [phone].

## 2012-10-26 NOTE — Progress Notes (Signed)
NUTRITION FOLLOW UP/CONSULT  Intervention:   1. Initiate Glucerna 1.2 at 20 mL/hr and advance by 10 mL every 4 hours to reach goal rate of 50 mL/hr. 60 mL Pro-Stat BID. This regimen will provide: 1840 kcal, 132 grams protein, 966 ml free water. This will meet 100% of estimated kcal and protein needs. 2. RD to continue to follow nutrition care plan  Nutrition Dx:   Inadequate oral intake related to inability to eat as evidenced by NPO status. Ongoing.   Goal:   TF regimen to provide >/=90% estimated nutrition needs. Unmet.  Monitor:   Vent status, TF initiation, weight trends, I/O's, labs  Assessment:   Pt experienced respiratory distress after vomiting several times and was intubated 3/28. Pt with hx of DM and diverticulitis with GI bleeding. Initially admitted to [facility] on 3/3 for right-sided weakness and facial droop, MRI showed small acute non-hemorrhagic infarcts.  Patient is currently intubated on ventilator support.  MV: 10.8 Temp:Temp (24hrs), Avg:98.3 F (36.8 C), Min:97.8 F (36.6 C), Max:99 F (37.2 C)  RD consulted for initiation/management of enteral nutrition per Dr. Molli Knock. Will order at this time.  Height: Ht Readings from Last 1 Encounters:  10/04/2012 5' 11.5" (1.816 m)    Weight Status:   Wt Readings from Last 1 Encounters:  10/26/12 192 lb 0.3 oz (87.1 kg)  Admit wt 183 lb - wt up likely 2/2 +5.5 liter fluid balance  Re-estimated needs:  Kcal: 1816 Protein: 125 - 135 grams Fluid: 2 - 2.2 liters daily  Skin: incision  Diet Order:   NPO   Intake/Output Summary (Last 24 hours) at 10/26/12 1024 Last data filed at 10/26/12 1000  Gross per 24 hour  Intake 2928.94 ml  Output    318 ml  Net 2610.94 ml    Last BM: 3/26   Labs:   Recent Labs Lab 10/23/2012 0605 10/13/2012 1400 10/26/12 0500  NA 142 140 139  K 4.4 4.5 4.9  CL 99 105 105  CO2 27 24 21   BUN 35* 38* 48*  CREATININE 1.73* 2.21* 2.62*  CALCIUM 9.5 8.0* 8.8  GLUCOSE 219*  193* 175*    CBG (last 3)   Recent Labs  10/26/12 0001 10/26/12 0416 10/26/12 0728  GLUCAP 156* 162* 151*    Scheduled Meds: . antiseptic oral rinse  15 mL Mouth Rinse QID  . chlorhexidine  15 mL Mouth Rinse BID  . enoxaparin (LOVENOX) injection  30 mg Subcutaneous Q24H  . famotidine (PEPCID) IV  20 mg Intravenous Q12H  . insulin aspart  0-15 Units Subcutaneous Q4H  . pantoprazole (PROTONIX) IV  40 mg Intravenous QHS  . piperacillin-tazobactam (ZOSYN)  IV  3.375 g Intravenous Q8H  . vancomycin  1,000 mg Intravenous Q24H    Continuous Infusions: . norepinephrine (LEVOPHED) Adult infusion 12 mcg/min (10/26/12 0101)  . vasopressin (PITRESSIN) infusion - *FOR SHOCK* 0.03 Units/min (10/11/2012 1000)    Jarold Motto MS, RD, LDN Pager: [phone] After-hours pager: [phone]

## 2012-10-26 NOTE — Progress Notes (Signed)
eLink Physician-Brief Progress Note Patient Name: Joe Snow DOB: 12-20-1923 MRN: [id number]  Date of Service  10/26/2012   HPI/Events of Note  Call from nurse reporting that patient is bradying down into the 40s.  Remains HD stable.  Current HR of 58 with BP of 127/88 (95).  On cont fentanyl gtt for sedation.  eICU Interventions  Plan: D/C fentanyl gtt Continue with prn fentanyl/versed   Intervention Category Intermediate Interventions: Arrhythmia - evaluation and management  Ocean Kearley 10/26/2012, 6:31 AM

## 2012-10-27 ENCOUNTER — Inpatient Hospital Stay (HOSPITAL_COMMUNITY): Payer: Medicare Other

## 2012-10-27 DIAGNOSIS — R1310 Dysphagia, unspecified: Secondary | ICD-10-CM

## 2012-10-27 LAB — BASIC METABOLIC PANEL
BUN: 57 mg/dL — ABNORMAL HIGH (ref 6–23)
Calcium: 8.8 mg/dL (ref 8.4–10.5)
Chloride: 103 mEq/L (ref 96–112)
Creatinine, Ser: 2.27 mg/dL — ABNORMAL HIGH (ref 0.50–1.35)
GFR calc Af Amer: 28 mL/min — ABNORMAL LOW (ref >90)

## 2012-10-27 LAB — POCT I-STAT 3, ART BLOOD GAS (G3+)
O2 Saturation: 96 %
TCO2: 27 mmol/L (ref 0–100)
pCO2 arterial: 43.2 mmHg (ref 35.0–45.0)

## 2012-10-27 LAB — BLOOD GAS, ARTERIAL
Drawn by: 31101
O2 Saturation: 97.3 %
PEEP: 5 cmH2O
Patient temperature: 98.6
RATE: 20 resp/min
pH, Arterial: 7.507 — ABNORMAL HIGH (ref 7.350–7.450)

## 2012-10-27 LAB — CBC
HCT: 34.7 % — ABNORMAL LOW (ref 39.0–52.0)
MCH: 26.9 pg (ref 26.0–34.0)
MCV: 81.3 fL (ref 78.0–100.0)
Platelets: 133 10*3/uL — ABNORMAL LOW (ref 150–400)
RDW: 17.4 % — ABNORMAL HIGH (ref 11.5–15.5)
WBC: 18 10*3/uL — ABNORMAL HIGH (ref 4.0–10.5)

## 2012-10-27 LAB — GLUCOSE, CAPILLARY: Glucose-Capillary: 198 mg/dL — ABNORMAL HIGH (ref 70–99)

## 2012-10-27 LAB — MAGNESIUM: Magnesium: 2.1 mg/dL (ref 1.5–2.5)

## 2012-10-27 MED ORDER — HALOPERIDOL LACTATE 5 MG/ML IJ SOLN
INTRAMUSCULAR | Status: AC
  Filled 2012-10-27: qty 1

## 2012-10-27 MED ORDER — SODIUM CHLORIDE 0.9 % IV SOLN: INTRAVENOUS | Status: DC

## 2012-10-27 MED ORDER — LORAZEPAM 2 MG/ML IJ SOLN
2.0000 mg | Freq: Once | INTRAMUSCULAR | Status: AC
  Administered 2012-10-27: 2 mg via INTRAVENOUS
  Filled 2012-10-27: qty 1

## 2012-10-27 MED ORDER — ATROPINE ORAL SOLUTION 0.08 MG/ML: 0.4000 mg | Freq: Three times a day (TID) | ORAL | Status: DC | PRN

## 2012-10-27 MED ORDER — POTASSIUM PHOSPHATE DIBASIC 3 MMOLE/ML IV SOLN
30.0000 mmol | Freq: Once | INTRAVENOUS | Status: AC
  Administered 2012-10-27: 30 mmol via INTRAVENOUS
  Filled 2012-10-27: qty 10

## 2012-10-27 MED ORDER — ATROPINE SULFATE 1 % OP SOLN
2.0000 [drp] | Freq: Three times a day (TID) | OPHTHALMIC | Status: DC | PRN
  Administered 2012-10-27: 2 [drp] via SUBLINGUAL
  Filled 2012-10-27: qty 2

## 2012-10-27 MED ORDER — DEXMEDETOMIDINE HCL IN NACL 200 MCG/50ML IV SOLN
0.2000 ug/kg/h | INTRAVENOUS | Status: DC
  Administered 2012-10-27: 0.45 ug/kg/h via INTRAVENOUS
  Filled 2012-10-27: qty 50

## 2012-10-27 MED ORDER — HALOPERIDOL LACTATE 5 MG/ML IJ SOLN
4.0000 mg | Freq: Once | INTRAMUSCULAR | Status: AC
  Administered 2012-10-27: 12:00:00 via INTRAVENOUS

## 2012-10-27 MED ORDER — MORPHINE SULFATE 25 MG/ML IV SOLN
10.0000 mg/h | INTRAVENOUS | Status: DC
  Administered 2012-10-27: 10 mg/h via INTRAVENOUS
  Filled 2012-10-27 (×3): qty 10

## 2012-10-27 MED ORDER — MORPHINE BOLUS VIA INFUSION
5.0000 mg | INTRAVENOUS | Status: DC | PRN
  Filled 2012-10-27: qty 20

## 2012-10-27 MED ORDER — FUROSEMIDE 10 MG/ML IJ SOLN
40.0000 mg | Freq: Three times a day (TID) | INTRAMUSCULAR | Status: DC
  Administered 2012-10-27: 40 mg via INTRAVENOUS
  Filled 2012-10-27: qty 4

## 2012-10-27 MED ORDER — PANTOPRAZOLE SODIUM 40 MG PO PACK
40.0000 mg | PACK | Freq: Every day | ORAL | Status: DC
  Filled 2012-10-27: qty 20

## 2012-10-27 NOTE — Progress Notes (Signed)
Spoke with the family extensively today, decision was made to extubate patient and examine his respiratory effort.  Patient was extubated after passing an SBT.  The patient's respiratory effort is adequate but 45 minutes post extubation airway protection has clearly become a large issue.  Patient is unable to protect his airway.  Impending respiratory failure.  After discussion with the family, decision was made to make patient the patient a full NCB and comfort care at the family's request.  Will change code status to full NCB and start morphine to be started per orders.  Additional CC time of 45 minutes.  Alyson Reedy, M.D. [facility] Pulmonary/Critical Care Medicine. Pager: [phone]. After hours pager: [phone].

## 2012-10-27 NOTE — Procedures (Signed)
Extubation Procedure Note  Patient Details:   Name: Joe Snow DOB: 03/29/1924 MRN: [id number]   Airway Documentation:  Airway (Active)    Evaluation  O2 sats: stable throughout Complications: No apparent complications Patient did tolerate procedure well. Bilateral Breath Sounds: Diminished Suctioning: Airway No.  Pt. Extubated per Dr. Jarrett Ables. And placed on 5L [place].  Sats are 95   Joe Snow, [name] 10/27/2012, 12:31 PM

## 2012-10-27 NOTE — Progress Notes (Signed)
PULMONARY  / CRITICAL CARE MEDICINE  Name: Joe Snow MRN: [id number] DOB: [birth year]    ADMISSION DATE:  09/29/2012   REFERRING MD :  Rehab  CHIEF COMPLAINT:  resp distress  BRIEF PATIENT DESCRIPTION: 79 y.o. Man with  history of hypertension, diabetes mellitus and atrial fibrillation , diverticulitis with GI bleeding December 2013. Initially admitted 09/30/2012 with right-sided weakness and facial droop.  to [facility] , given TPA. MRI 3/4 showed small acute non hemorrhagic infarcts inferior aspect of  the right cerebellum and right  medulla.  Carotid Dopplers with no ICA stenosis. Echocardiogram showed EF  65% . He had swallowing difficulties & rpt  MRI showed  extension of the prior infarct . Placed on CPAP for  sleep apnea , PEG placed for dysphagia.  Transferred to rehab 10/09/2012  - some discussion about Tstomy due to secretions but his was deferred. Admitted to 2100 on 3/28 after an episode of witnessed aspiration/ vomiting in rehab with resp distress requiring emergent mechanical ventilation.  SIGNIFICANT EVENTS / STUDIES:  3/29 met with family, they wish for no CPR, cardioversion, dialysis or trach and that once extubated he is to become DNR with no intention to reintubate.  LINES / TUBES: ETT 3/ 28 >> RIJ 3/28 >>  CULTURES: Resp 3/28 >> Bld 3/28 >> Urine 3/28 >>  ANTIBIOTICS: Zosyn 3/28 >> vanc 3/28 >>  SUBJECTIVE: Agitated and appears angry when off sedation.  VITAL SIGNS: Temp:  [97.4 F (36.3 C)-98.2 F (36.8 C)] 97.4 F (36.3 C) (03/30 0745) Pulse Rate:  [45-84] 59 (03/30 0737) Resp:  [19-24] 20 (03/30 0737) BP: (75-210)/(44-94) 160/87 mmHg (03/30 0737) SpO2:  [92 %-100 %] 100 % (03/30 0737) FiO2 (%):  [29.7 %-30.3 %] 30 % (03/30 0737) Weight:  [85.4 kg (188 lb 4.4 oz)] 85.4 kg (188 lb 4.4 oz) (03/30 0545) HEMODYNAMICS: CVP:  [3 mmHg-15 mmHg] 9 mmHg VENTILATOR SETTINGS: Vent Mode:  [-] PRVC FiO2 (%):  [29.7 %-30.3 %] 30 % Set Rate:  [20  bmp] 20 bmp Vt Set:  [550 mL] 550 mL PEEP:  [5 cmH20] 5 cmH20 Plateau Pressure:  [16 cmH20-17 cmH20] 16 cmH20 INTAKE / OUTPUT: Intake/Output     03/29 0701 - 03/30 0700 03/30 0701 - 03/31 0700   I.V. (mL/kg) 565.8 (6.6)    Other 450    IV Piggyback 400    Total Intake(mL/kg) 1415.8 (16.6)    Urine (mL/kg/hr) 2273 (1.1)    Drains     Total Output 2273     Net -857.2           PHYSICAL EXAMINATION: Gen.chronically ill, poorly-nourished, in no distress, intubated, sedated but very agitated when off sedation. ENT - no lesions, no post nasal drip, ETT. Neck: No JVD, no thyromegaly, no carotid bruits Lungs: no use of accessory muscles, no dullness to percussion, Lt  Rales, coarsehi  Cardiovascular: Rhythm regular, heart sounds  normal, no murmurs, no peripheral edema Abdomen: soft and non-tender, no hepatosplenomegaly, BS normal. Musculoskeletal: No deformities, no cyanosis or clubbing Neuro:  alert, non focal Skin:  Warm, no lesions/ rash  LABS:  Recent Labs Lab 10/23/2012 0151 10/14/2012 0605 10/12/2012 0820  10/08/2012 1400 10/27/2012 1408 10/07/2012 1950 10/06/2012 2300 10/10/2012 2304 10/26/12 0355 10/26/12 0500 10/27/12 0351 10/27/12 0440  HGB  --  14.6  --   --   --   --   --   --   --   --  11.9*  --  11.5*  WBC  --  12.5*  --   --   --   --   --   --   --   --  22.5*  --  18.0*  PLT  --  204  --   --   --   --   --   --   --   --  143*  --  133*  NA  --  142  --   --  140  --   --   --   --   --  139  --  139  K  --  4.4  --   --  4.5  --   --   --   --   --  4.9  --  3.6  CL  --  99  --   --  105  --   --   --   --   --  105  --  103  CO2  --  27  --   --  24  --   --   --   --   --  21  --  23  GLUCOSE  --  219*  --   --  193*  --   --   --   --   --  175*  --  196*  BUN  --  35*  --   --  38*  --   --   --   --   --  48*  --  57*  CREATININE  --  1.73*  --   --  2.21*  --   --   --   --   --  2.62*  --  2.27*  CALCIUM  --  9.5  --   --  8.0*  --   --   --   --   --  8.8   --  8.8  MG  --   --   --   --   --   --   --   --   --   --   --   --  2.1  PHOS  --   --   --   --   --   --   --   --   --   --   --   --  2.9  LATICACIDVEN  --   --  2.1  --   --   --   --  2.9*  --   --   --   --   --   TROPONINI  --   --  <0.30  --   --  <0.30 <0.30  --   --   --   --   --   --   PHART 7.351  --   --   < >  --   --   --   --  7.466* 7.441  --  7.507*  --   PCO2ART 55.0*  --   --   < >  --   --   --   --  28.9* 31.3*  --  28.8*  --   PO2ART 96.9  --   --   < >  --   --   --   --  71.4* 81.4  --  76.7*  --   < > = values in this interval not displayed.  Recent Labs Lab 10/26/12 1547 10/26/12 1955 10/26/12 2351 10/27/12  0410 10/27/12 0724  GLUCAP 148* 166* 195* 196* 198*   CXR: ETT/ RIJ in position, Lt ASD  ASSESSMENT / PLAN:  PULMONARY A:Acute resp failure Aspiration pneumonia P:   - Hope here is to get patient on precedex where he is less agitated and wean successfully.  If able to extubate then will not re-intubate, this was discussed in details with the patient's family and they are coming inhere today in case patient is able to extubate. - Vent bundle. - Tracheostomy is not an option here, see previous discussion.  CARDIOVASCULAR A: Septic shock Echo - nml LV fn A fibn P:  - Adequately volume resuscitated, will gently diurese today. - RIJ placed. - Follow CVP. - KVO IVF. - Hope is that once off sedation will be able to get off sedation and that hemodynamics will not affect extubation plans.  RENAL A:  Acute renal failure on CKD - baseline cr 1.4 range.  Likely ATN. P:   - Avoid nephrotoxins - Replace electrolytes as needed. - Gentle diureses today.  GASTROINTESTINAL A:  Dysphagia s/p PEG P:   - Continue TF, will hold when ready to extubate.  HEMATOLOGIC A:  High risk DVT P:  - Hold xarelto in anticipation of procedures. - Sq lovenox.  INFECTIOUS A:  Aspiration, nosocomial P:   - Pan cx. - Zosyn/ vanc empiric.  ENDOCRINE A:   DM-2   P:   - SSI.  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 - Precedex for sedation and d/c versed, hope is that we can extubate with no intention to reintubate.  TODAY'S SUMMARY: Aspiration, LLL pna & shock requiring intubation   I have personally obtained a history, examined the patient, evaluated laboratory and imaging results, formulated the assessment and plan and placed orders.  CRITICAL CARE: The patient is critically ill with multiple organ systems failure and requires high complexity decision making for assessment and support, frequent evaluation and titration of therapies, application of advanced monitoring technologies and extensive interpretation of multiple databases. Critical Care Time devoted to patient care services described in this note is 35 minutes.   Koren Bound Pulmonary and Critical Care Medicine [facility] Pager: [phone]  10/27/2012, 8:56 AM

## 2012-10-27 NOTE — Progress Notes (Signed)
Shift Overview: Pt was extubated to Nasal [place] at 1215. While the patient is maintaining sats, airway protection is an issue. The family desire comfort care and full dignity for Joe Snow. We initiated a morphine gtt at 1315 at the families request. We have titrated the morphine gtt up to 20 mg/hr for comfort.All other medications have been discontinued. We have removed the PAS hose and blood pressure cuff at the families request We have started atropine gtts SL to dry oral secretions. The family is at bedside and very involved in the palliation process.I have told them that it can sometimes take awhile, but they are satisfied that Joe Snow looks comfortable and at peace.

## 2012-10-28 ENCOUNTER — Inpatient Hospital Stay (HOSPITAL_COMMUNITY): Payer: Medicare Other

## 2012-10-29 ENCOUNTER — Encounter: Payer: Self-pay | Admitting: Internal Medicine

## 2012-10-29 NOTE — Progress Notes (Signed)
Pt asystole in 2 leads, pupil fixed and dilated, no spontaneous breath sounds or apical heart tones noted. Pt is DNR. Dr. Tyson Alias notified. All verified with Larita Fife, RN. Pt pronounced at 1022. Chaplin notified. Family at bedside.

## 2012-10-29 NOTE — Progress Notes (Signed)
Chaplain arrived shortly after pt passed. Pt's children, siblings, and in-laws were present when I arrived. Pt's wife arrived during the conclusion of my visit. Pt's son asked that I have prayer w/the family. Family very tearful but thankful for prayer and Chaplain support. Family also expressed gratitude for the [facility] staff and support during pt's hospitalization.  Marjory Lies Chaplain

## 2012-10-29 NOTE — Clinical Social Work Psychosocial (Signed)
     Clinical Social Work Department BRIEF PSYCHOSOCIAL ASSESSMENT 10/17/2012  Patient:  Joe, Snow     Account Number:  [id number]     Admit date:  10/02/2012  Clinical Social Worker:  Margaree Mackintosh  Date/Time:  10/08/2012 10:30 AM  Referred by:  Physician  Date Referred:  10/27/2012 Referred for  Residential hospice placement   Other Referral:   Interview type:  Family Other interview type:    PSYCHOSOCIAL DATA Living Status:  FACILITY Admitted from facility:  OTHER Level of care:  Skilled Nursing Facility Primary support name:  Joe Snow: [phone] Primary support relationship to patient:  SPOUSE Degree of support available:   Unknown.  Pt from CIR.    CURRENT CONCERNS Current Concerns  Post-Acute Placement   Other Concerns:    SOCIAL WORK ASSESSMENT / PLAN Clinical Social Worker recieved referral for "Comfort Care".  CSW reviewed chart and staffed case with RNCM. Pt's blood pressure last checked at 8am (70/39). CSW met with family briefly at bedside and provided tissues.  CSW to continue to follow and assist as needed.   Assessment/plan status:  Information/Referral to [facility] Other assessment/ plan:   Information/referral to community resources:   Hospital Waiting areas.    PATIENTS/FAMILYS RESPONSE TO PLAN OF CARE: Pt currently unable to participate in assessment.  Family thanked CSW for intervention.

## 2012-10-29 DEATH — deceased

## 2012-10-31 LAB — CULTURE, BLOOD (ROUTINE X 2): Culture: NO GROWTH

## 2012-11-01 NOTE — Discharge Summary (Signed)
NAMEJASRAJ, LAPPE NO.:  [id number]  MEDICAL RECORD NO.:  [id number]  LOCATION:                                 FACILITY:  PHYSICIAN:  Felipa Evener, MD  DATE OF BIRTH:  1924/04/18  DATE OF ADMISSION:  12-Nov-2012 DATE OF DISCHARGE:  10/09/2012                              DISCHARGE SUMMARY   DEATH SUMMARY.  PRIMARY DIAGNOSIS/CAUSE OF DEATH:  Aspiration pneumonia.  SECONDARY DIAGNOSES:  Ischemic CVA with dependent respiratory failure, history of hypertension, diabetes, atrial fibrillation with rapid ventricular response, acute renal failure, acidosis, and hypokalemia.  HOSPITAL COURSE:  The patient is an 79 year old male, who suffered a stroke prior to admission that required tPA.  However, he failed to respond with his significant dysphagia requiring multiple intubation. He was intubated after an episode of aspiration pneumonia at which point in time after extensive discussion with the family we offered tracheostomy and further rehabilitation.  The family informed us that the patient would not want that quality of life, at which point, morphine was started and the patient subsequently extubated and expired comfortably with the family bedside.     Felipa Evener, MD     WJY/MEDQ  D:  10/31/2012  T:  11/01/2012  Job:  [id number]

## 2012-11-28 DEATH — deceased

## 2013-09-09 IMAGING — CR DG CHEST 1V PORT
1 series · 1 of 1 positions shown · non-contrast
Comparison: One-view chest 10/12/2012.

CLINICAL DATA: Cough.  Evaluation for aspiration pneumonia.

PORTABLE CHEST - 1 VIEW

[AP]
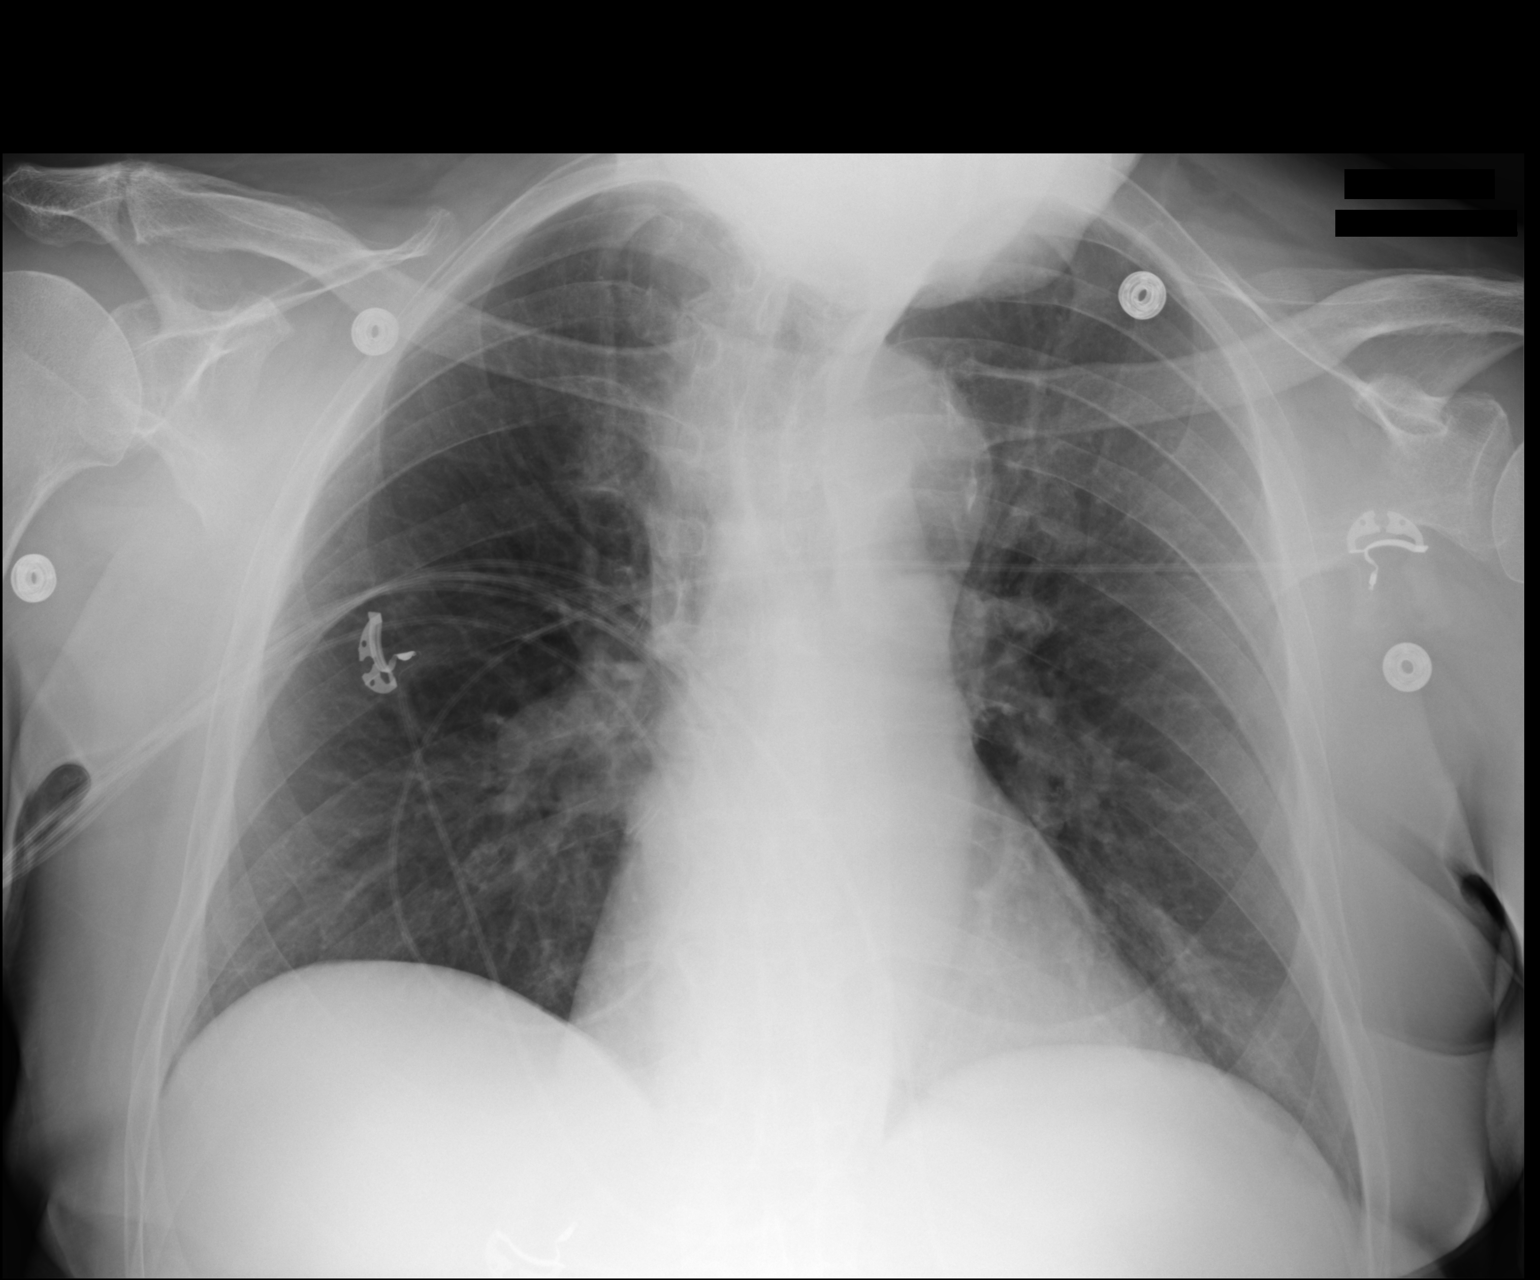

[1 of 1 positions shown; findings below may reference images not displayed]

FINDINGS: Heart size is normal.  Aeration at the right lung base
has improved.  Mild chronic interstitial coarsening is otherwise
stable.
IMPRESSION: 1.  Improved aeration right lung base without evidence for residual
airspace disease.
2. Stable underlying chronic interstitial coarsening.

## 2014-11-17 NOTE — Consult Note (Signed)
Chief Complaint:   Subjective/Chief Complaint No bleeding. No BM since yesterday. Hemoglibin stable at 11.6. INR normal.  Impression: Probable diverticular bleed, resolved. History of colon polyps.  Recommendations: Soft diet. Probable DC later today or tomorrow. Do not resume Coumadin on discharge unless strongly indicated. May take baby ASA as OP. Follow up with me in 1 week (orders written) and will setup an OP colonoscopy. Will sign off. Please call me if needed.   Electronic Signatures: Lurline DelIftikhar, Jurni Cesaro (MD)  (Signed 23-Dec-13 12:17)  Authored: Chief Complaint   Last Updated: 23-Dec-13 12:17 by Lurline DelIftikhar, Heinz Eckert (MD)

## 2014-11-17 NOTE — Consult Note (Signed)
PATIENT NAME:  Joe Snow, Joe Snow MR#:  [id number] DATE OF BIRTH:  1923/09/20  DATE OF CONSULTATION:  07/21/2012    CONSULTING PHYSICIAN:  Lurline DelShaukat Azelie Noguera, MD  REFERRING PHYSICIAN:  Ramonita LabAruna Gouru, MD.   REASON FOR ADMISSION:  Hematochezia.    HISTORY OF PRESENT ILLNESS: This is an 81 year old male with history of hypertension, insulin-dependent diabetes, multiple TIAs on Coumadin.  According to the patient, about 36 hours ago he went to the bathroom and passed a moderate amount of bright red blood per rectum. Since then he has had 3 or 4 loose bloody bowel movements at home, as well as one here in the hospital. At least one here in the hospital. The bowel movements have been described as bloody with almost no stool. The blood has been described as dark red to bright red. No melena was reported. The patient denies any abdominal pain, nausea or vomiting. He denies any prior history of GI bleed.   PAST MEDICAL HISTORY: As above.   SOCIAL HISTORY: Does not smoke or drink.   FAMILY HISTORY: Unremarkable.   HOME MEDICATIONS: A complete list of his home medicine is not available, but he does take Coumadin. Denies using any non-steroidals.   REVIEW OF SYSTEMS:  Grossly negative, except for what is mentioned in the history of present illness:   PHYSICAL EXAMINATION:  GENERAL: Fairly well built male who does not appear to be in any acute distress. Fully awake, alert and oriented. SKIN: Pink and warm.  VITAL SIGNS: His vitals are fairly stable. Heart rate is in 70s and 80s, blood pressure about 178/73, respirations 18.   NECK: Veins are flat.  LUNGS: Grossly clear to auscultation, bilaterally with fair air entry and no added sounds. CARDIOVASCULAR: Regular rate and rhythm. Normal S1, S2. 2/6 systolic murmur was heard.  ABDOMEN: Soft, benign, nontender, nondistended. No hepatosplenomegaly. Bowel sounds positive.  NEUROLOGIC: Examination appears to be grossly intact.   Laboratory, diagnostic, and  radiological data: Hemoglobin 14, white cell count 10, hematocrit 42.9, platelet count 137, MCV normal at 94. INR is 3.3. Creatinine is 1.72, BUN is 32.   ASSESSMENT AND PLAN: The patient with what appears to be diverticular bleeding. A previous colonoscopy done in 2005 showed multiple medium-sized diverticula in the sigmoid colon. A few small polyps were removed, as well. This current episode is highly consistent with diverticular bleed. The patient is hemodynamically stable and his hemoglobin and hematocrit seem to be stable since admission. I agree with current management with transfusion of fresh frozen plasma to reverse coagulopathy. Follow hemoglobin and hematocrit every six hours. If there are signs of significant active gastrointestinal blood loss I would recommend bleeding scan and Vascular Surgery consultation. Further recommendations to follow depending on the patient's hospital course. We will follow.     ____________________________ Lurline DelShaukat Mialee Weyman, MD si:eg D: 07/21/2012 03:27:40 ET T: 07/21/2012 21:09:27 ET JOB#: [id number]  cc: Lurline DelShaukat Sequoya Hogsett, MD, <Dictator> Lurline DelSHAUKAT Claramae Rigdon MD ELECTRONICALLY SIGNED 07/22/2012 18:12

## 2014-11-17 NOTE — Consult Note (Signed)
Brief Consult Note: Diagnosis: GI bleed.   Patient was seen by consultant.   Consult note dictated.   Discussed with Attending MD.   Comments: Probably diverticular bleed. Patient is hemodynamically stable with stable H and H.  Agree with FFP transfusion. Follow H and H. Clear liquid diet. Will follow.  Electronic Signatures: Lurline DelIftikhar, Skylen Spiering (MD)  (Signed 22-Dec-13 03:27)  Authored: Brief Consult Note   Last Updated: 22-Dec-13 03:27 by Lurline DelIftikhar, Letisia Schwalb (MD)

## 2014-11-17 NOTE — H&P (Signed)
DATE OF BIRTH:  12/28/1923  PRIMARY CARE PHYSICIAN:  Dr. Sherrie Mustache  REFERRING PHYSICIAN:  Dr. Darnelle Catalan   GASTROENTEROLOGY:  Dr. Mechele Collin   CHIEF COMPLAINT:  Lower GI bleed.   HISTORY OF PRESENT ILLNESS:  The patient is an 81 year old Caucasian male with a past medical history of hypertension, hyperlipidemia, insulin-dependent diabetes mellitus and multiple TIAs, regarding which he is on Coumadin to prevent strokes and cardiac murmur. He is presenting to the ER with a chief complaint of lower GI bleed since yesterday. The patient is reporting that he was in good health until yesterday, and then suddenly he started having bloody bowel movements. Yesterday, he had one, and today he had 2 large bowel movements with blood at home. The patient came into the ER, and in the ER he had another 3 bright red blood stools as reported by the ER staff. Rectal exam was done by the ER physician, Dr. Darnelle Catalan, and fresh blood was noticed. He could not appreciate any external hemorrhoids or fissures or fistula. Abdominal series was ordered, which is pending at this time. The patient has received vitamin K, as he is on Coumadin, and 2 units of fresh frozen plasma is ordered to transfuse. The patient's initial hemoglobin is stable at 14.5. The patient denies any abdominal pain or discomfort. He is reporting that he follows up with Dr. Mechele Collin as an outpatient and gets colonoscopy every five years. His colonoscopy has revealed polyps in the past, which were benign in nature as reported by the patient. The patient denies any nausea, vomiting. No similar complaints in the past. The patient's creatinine is elevated at 1.76 with a BUN of 33, and the patient's GFR is at 34. His hemoglobin is at 14.5 and hematocrit 44.6. Platelet count is low at 137,000. Prothrombin time is 33.2, and INR is 3.3. The patient denies any past medical history of diverticulosis or diverticulitis.   PAST MEDICAL HISTORY:   1.  Hypertension.  2.   Hyperlipidemia.  3.  Insulin-dependent diabetes mellitus.  4.  Multiple TIAs in the past, and the patient is on Coumadin to prevent stroke as reported by him.  5.  Cardiac murmur.  6.  Prostate cancer.   PAST SURGICAL HISTORY:  Prostatectomy, colonoscopies in the past.   ALLERGIES:  The patient has no known drug allergies.   HOME MEDICATIONS:  Valtrex 500 mg p.o. once a day, Singulair 10 mg once a day, warfarin 5 mg once a day,  levothyroxine 88 mcg p.o. once daily, hydrochlorothiazide 1 tablet p.o. once daily, glucosamine 500 mg 1 capsule p.o. once daily, iron sulfate 325 mg once daily, Actos 30 mg once daily.   PSYCHOSOCIAL HISTORY:  Lives at home with his wife. Denies any smoking. He used to smoke, but quit smoking several years ago. Denies any illicit drug use. Takes wine occasionally.   FAMILY HISTORY: His dad had some heart problems, but this was with old age.   ROS: CONSTITUTIONAL:  Denies any fever, fatigue, weakness, pain, weight loss or weight gain.  EYES:  Denies any blurry vision, glaucoma, cataracts. He has some growth in the left eye with decrease in his vision. He is not quite sure about the name of the growth. He denies any cataracts.  EARS, NOSE, THROAT:  Denies tinnitus, ear pain, discharge, postnasal drip, swallowing difficulty.  RESPIRATORY:  Denies cough, wheezing, COPD, dyspnea, asthma.  CARDIOVASCULAR:  Denies chest pain, orthopnea, palpitations, syncope, varicose veins.  GASTROINTESTINAL:  Denies nausea, vomiting, but positive bloody diarrhea.  He denies any abdominal pain, but complaining of abdominal discomfort with bloating. Denies any constipation, GERD, jaundice.  GENITOURINARY:  Denies dysuria, hematuria, urinary frequency ENDOCRINE:  Denies polyuria, polyphagia, polydipsia. The patient has hypothyroidism. Denies any increased sweating.  HEMATOLOGIC AND LYMPHATIC: Denies anemia, easy bruising, bleeding, swollen glands.  INTEGUMENTARY: Denies acne, rash, lesions.   MUSCULOSKELETAL: Denies any pain in the neck, back, shoulder, knee pain, hip.  NEUROLOGICAL: Denies numbness, vertigo, ataxia, dementia, headache.  PSYCHIATRIC:  Denies anxiety, insomnia, ADD, OCD, bipolar disorder.   PHYSICAL EXAMINATION:  VITAL SIGNS:  Temperature 97.9, pulse 58 to 84, respiratory rate 18 to 20, blood pressure initially 182/84, eventually 179/89, satting 96% to 99%.  GENERAL APPEARANCE:  Not under acute distress, moderately built and moderately nourished.  HEENT:  Normocephalic, atraumatic. Left eye has decreased vision from some sort of growth. Right eye, pupil is reactive to light and accommodation. No conjunctival injection. No nasal congestion. No ear discharge. Tympanic membranes are intact. No postnasal drip.  NECK:  Supple. No JVD. No carotid bruits. No thyromegaly.  LUNGS:  Clear to auscultation bilaterally. No accessory muscle usage. No crackles. No wheezing. No anterior chest wall tenderness.  CARDIAC:  S1, S2 normal. Regular rate and rhythm. Positive cardiac murmur. No peripheral edema. Peripheral pulses 2+.  GASTROINTESTINAL:  Soft. Bowel sounds are positive in all four quadrants. Nontender, nondistended. No masses felt.  NEUROLOGIC:  Awake, alert and oriented x 3. Answering all questions appropriately. Motor and sensory are grossly intact. Reflexes are 2+.  SKIN:  No rashes. No lesions. No acne. Turgor is normal.  EXTREMITIES: No cyanosis. No edema. No clubbing.  RECTAL: Exam was done by ER physician. As per ER physician's note, the patient has fresh blood, but no lesions or fissures noticed.   LABORATORIES AND IMAGING STUDIES:  Glucose is 272, BUN 33, creatinine 1.76, sodium 141, potassium 4.5, chloride 109, CO2 of 28 and GFR is 34. Anion gap is 4, serum osmolality 298. WBC 5.9, hemoglobin is 14.5, hematocrit 44.6, platelet count is 137,000. MCV 95, PT 33.2, the INR is 3.3.   ASSESSMENT AND PLAN:  An 81 year old Caucasian male presenting to the Emergency Room  with a chief complaint of a lower gastrointestinal bleed. He will be admitted with the following assessment and plan:  1.  Lower gastrointestinal bleed, acute, probably from internal hemorrhoids versus diverticulitis.       a.  We will make him n.p.o. We will provide him proton-pump inhibitor. We will put him on IV fluids.       b.  Check hemoglobin and hematocrit every 6 hours.       c.  We will hold Coumadin.       d.  Vitamin K was given in the Emergency Room, and we will type and crossmatch, and transfuse 2 units of fresh frozen plasma. We will also type and crossmatch for 2 units of packed red blood cells.       e.  Abdominal series is ordered, which is pending at this time. If the bleeding persists, we will consider a bleeding scan tonight. Gastrointestinal consult is placed and discussed with Dr. Niel HummerIftikhar, on-call physician for Dr. Molly Maduroobert Elliott's group.  2.  History of prostate cancer status post prostatectomy.  3.  Hypothyroidism. The patient is on Synthroid at home. As he is n.p.o., we will hold on Synthroid at this time.  4.  Insulin-dependent diabetes mellitus. The patient is n.p.o. We will provide him with insulin sliding scale coverage.  5.  Hypertension. We will give him Lopressor 5 mg IV as needed for systolic blood pressure greater than 150.  6.  Acute renal insufficiency, probably from dehydration. We will provide him gentle hydration with IV fluids. The patient might be having underlying chronic renal insufficiency, but we do not have any old medical records to learn about his baseline renal function. We will provide IV fluids and check creatinine in the morning. Avoid nephrotoxins.  7.  Past history of multiple transient ischemic attacks, and was placed on Coumadin to prevent stroke, as reported by the patient. We will hold off on the Coumadin, as the patient has an acute lower gastrointestinal bleed. We will provide him gastrointestinal prophylaxis with Protonix IV every 12 hours  and deep vein thrombosis prophylaxis with sequential compression devices.   The diagnosis and plan of care was discussed in detail with the patient. He is aware of the plan. Total time spent on the H and P is 60 minutes.   ____________________________ Ramonita Lab, MD ag:ms D: 07/20/2012 23:27:08 ET T: 07/21/2012 13:06:25 ET JOB#: [id number]  cc: Ramonita Lab, MD, <Dictator> Marlyn Corporal, MD Lurline Del, MD Ramonita Lab MD ELECTRONICALLY SIGNED 07/24/2012 2:03

## 2014-11-17 NOTE — Discharge Summary (Signed)
PATIENT NAME:  Joe Snow, Parthiv C MR#:  [id number] DATE OF BIRTH:  11-Jun-1924  DATE OF ADMISSION:  07/20/2012 DATE OF DISCHARGE:  07/23/2012  CONSULTANT: Dr. Niel HummerIftikhar from GI.   CHIEF COMPLAINT: Lower gastrointestinal bleed.   PRIMARY CARE PHYSICIAN: Dr. Dario GuardianJadali.   DISCHARGE DIAGNOSES: 1.  Acute lower gastrointestinal bleed, likely diverticular in nature.  2.  Chronic kidney disease.  3.  Diabetes.  4.  Chronic atrial fibrillation.  5.  Hypertension.  6.  Hyperlipidemia.  7.  Insulin-dependent diabetes mellitus.  8.  History of transient ischemic attack in the past.  9.  History of prostate cancer.  10.  History of murmur.   DISCHARGE MEDICATIONS:  Ferrous sulfate 325 mg 1 tab once a day, Actos 30 mg daily, levothyroxine 88 mcg daily, lovastatin 10 mg daily, Singulair 10 mg daily, glucosamine 500 mg oral capsule 1 cap daily, advanced colon care 1 tab daily, Valtrex 500 mg 1 cap once a day as needed, Levemir 20 units 2 times a day, metoprolol tartrate 25 mg 2 times a day, amlodipine 5 mg daily, aspirin 81 mg daily.   Please stop taking hydrochlorothiazide/lisinopril. Please stop taking warfarin.   DIET: Low sodium, low fat, ADA diet, low cholesterol.   ACTIVITY: As tolerated.   FOLLOWUP: Please follow with gastroenterologist within 1 to 2 weeks. Please follow with your  primary care physician and nephrology within 1 to 2 weeks. Please check a CBC within a week as well as follow for your blood pressure.   DISPOSITION: Home.   CODE STATUS: Full code.   HISTORY OF PRESENT ILLNESS: For full details of H and P, please see the dictation on  07/20/2012 in regards to the H and P but, briefly, this is a pleasant 81 year old Caucasian male with a history of diabetes, history of TIAs and A. fib., on Coumadin, who presented with lower GI bleed. He did have several bouts of bright red blood per rectum at home and then several episodes here. He did have FFP and some vitamin K given to him and his  warfarin was stopped. He was admitted to the hospitalist service with a GI consult. Initial INR was 3.3.   LABORATORY, DIAGNOSTIC, AND RADIOLOGICAL DATA: Creatinine on arrival 1.74, BUN 33, chloride 109, creatinine by discharge 1.85. LFTs on arrival within normal limits. Initial hemoglobin 14.5, last hemoglobin 11.1 earlier this morning. Initial INR 3.3, last INR 1. X-ray flat and erect of abdomen unremarkable.  Chest, 1-view x-ray showing no acute disease of the chest. Renal ultrasound shows no significant hydronephrosis.   HOSPITAL COURSE: The patient was admitted to the hospitalist service. GI was consulted. The patient likely had acute diverticular bleed. His INR was reversed and his Coumadin stopped. His acute lower GI bleed resolved and at this point patient's blood pressure has been stabilized and INR has been normalized. Given extensive bleed, at this point he would not be discharged on Coumadin. He will be discharged on low-dose aspirin for now. While hospitalized, he did have findings of renal failure. His creatinine had been in the 1.7 range. It was thought initially that this was possibly acute but it did not normalize with IV fluids and I called his PCP's office today and apparently the patient has had creatinine in the high 1s for the past several months and, therefore, this likely is chronic. He should follow with his PCP as well as GI for followup.  He has an appointment already for GI for possible colonoscopy. Initially, his insulin was  held and slowly resumed. He does have history of TIA and chronic A. fib. but, at this point, he will not be a candidate for anticoagulation given this acute GI bleed, and he is aware as well as wife that he is at a high risk of developing strokes possibly; however, the risk-benefit is against resuming Coumadin at this point. His blood pressure medications were adjusted and his HCTZ and lisinopril were held given his CKD. He will be discharged on amlodipine as  well as metoprolol, and his blood pressure is stable. His A. fib. has been rate controlled. He has had bowel movements without blood, no abdominal pain, and has tolerated is regular diet and, therefore, he will be discharged with outpatient followup.    TOTAL TIME SPENT: 40 minutes.    ____________________________ Krystal Eaton, MD sa:cs D: 07/23/2012 14:21:00 ET T: 07/23/2012 14:35:55 ET JOB#: [id number]  cc: Krystal Eaton, MD, <Dictator> Marlyn Corporal, MD Krystal Eaton MD ELECTRONICALLY SIGNED 07/30/2012 14:14

## 2014-11-17 NOTE — Consult Note (Signed)
Chief Complaint:   Subjective/Chief Complaint Patient feels better with less bloody bowel movement this morning. Hemoglobin dropped some to 11.4. Vitals atable.   VITAL SIGNS/ANCILLARY NOTES: **Vital Signs.:   22-Dec-13 08:00   Vital Signs Type Routine   Temperature Temperature (F) 97.3   Celsius 36.2   Pulse Pulse 69   Respirations Respirations 19   Systolic BP Systolic BP 164   Diastolic BP (mmHg) Diastolic BP (mmHg) 110   Mean BP 128   Pulse Ox % Pulse Ox % 97   Pulse Ox Activity Level  At rest   Oxygen Delivery Room Air/ 21 %   Lab Results: Routine Chem:  22-Dec-13 00:57    Glucose, Serum  147   BUN  32   Creatinine (comp)  1.72   Sodium, Serum 144   Potassium, Serum 4.0   Chloride, Serum  111   CO2, Serum 26   Calcium (Total), Serum 8.9   Anion Gap 7   Osmolality (calc) 296   eGFR (African American)  41   eGFR (Non-African American)  35 (eGFR values <60mL/min/1.73 m2 may be an indication of chronic kidney disease (CKD). Calculated eGFR is useful in patients with stable renal function. The eGFR calculation will not be reliable in acutely ill patients when serum creatinine is changing rapidly. It is not useful in  patients on dialysis. The eGFR calculation may not be applicable to patients at the low and high extremes of body sizes, pregnant women, and vegetarians.)   Hemoglobin A1c ([facility])  7.7 (The American Diabetes Association recommends that a primary goal of therapy should be <7% and that physicians should reevaluate the treatment regimen in patients with HbA1c values consistently >8%.)   Cholesterol, Serum 150   Triglycerides, Serum 89   HDL (INHOUSE) 45   VLDL Cholesterol Calculated 18   LDL Cholesterol Calculated 87 (Result(s) reported on 21 Jul 2012 at 01:51AM.)  Routine Hem:  22-Dec-13 00:57    Hemoglobin (CBC) 14.0   Hematocrit (CBC) 42.9   WBC (CBC) 10.1   RBC (CBC) 4.58   Platelet Count (CBC)  137   MCV 94   MCH 30.6   MCHC 32.6   RDW 14.4    Neutrophil % 55.6   Lymphocyte % 26.3   Monocyte % 13.3   Eosinophil % 3.8   Basophil % 1.0   Neutrophil # 5.6   Lymphocyte # 2.7   Monocyte #  1.3   Eosinophil # 0.4   Basophil # 0.1 (Result(s) reported on 21 Jul 2012 at 01:13AM.)    06:38    Hemoglobin (CBC)  11.4 (Result(s) reported on 21 Jul 2012 at 07:16AM.)   Hematocrit (CBC)  34.6 (Result(s) reported on 21 Jul 2012 at 07:16AM.)   Assessment/Plan:  Assessment/Plan:   Assessment Lower GI bleed most likely diverticular, clinically subsiding.    Plan Continue to follow H and H q 8 hours. Full liquid diet. Will follow.   Electronic Signatures: ,  (MD)  (Signed 22-Dec-13 11:13)  Authored: Chief Complaint, VITAL SIGNS/ANCILLARY NOTES, Lab Results, Assessment/Plan   Last Updated: 22-Dec-13 11:13 by ,  (MD)
